# Patient Record
Sex: Female | Born: 1960
Health system: Southern US, Community
[De-identification: ages and names within clinical notes are randomized; demographics above are authoritative.]

## PROBLEM LIST (undated history)

## (undated) DIAGNOSIS — D649 Anemia, unspecified: Secondary | ICD-10-CM

## (undated) DIAGNOSIS — E785 Hyperlipidemia, unspecified: Secondary | ICD-10-CM

## (undated) DIAGNOSIS — K219 Gastro-esophageal reflux disease without esophagitis: Secondary | ICD-10-CM

## (undated) DIAGNOSIS — I10 Essential (primary) hypertension: Secondary | ICD-10-CM

## (undated) DIAGNOSIS — R011 Cardiac murmur, unspecified: Secondary | ICD-10-CM

## (undated) DIAGNOSIS — E669 Obesity, unspecified: Secondary | ICD-10-CM

## (undated) DIAGNOSIS — T7840XA Allergy, unspecified, initial encounter: Secondary | ICD-10-CM

## (undated) DIAGNOSIS — G44009 Cluster headache syndrome, unspecified, not intractable: Secondary | ICD-10-CM

## (undated) DIAGNOSIS — E119 Type 2 diabetes mellitus without complications: Secondary | ICD-10-CM

## (undated) DIAGNOSIS — Z8719 Personal history of other diseases of the digestive system: Secondary | ICD-10-CM

## (undated) DIAGNOSIS — R42 Dizziness and giddiness: Secondary | ICD-10-CM

## (undated) HISTORY — PX: COLONOSCOPY: SHX174

## (undated) HISTORY — PX: OTHER SURGICAL HISTORY: SHX169

## (undated) HISTORY — DX: Cluster headache syndrome, unspecified, not intractable: G44.009

## (undated) HISTORY — DX: Hyperlipidemia, unspecified: E78.5

## (undated) HISTORY — DX: Gastro-esophageal reflux disease without esophagitis: K21.9

## (undated) HISTORY — DX: Type 2 diabetes mellitus without complications: E11.9

## (undated) HISTORY — PX: UPPER GASTROINTESTINAL ENDOSCOPY: SHX188

## (undated) HISTORY — DX: Allergy, unspecified, initial encounter: T78.40XA

## (undated) HISTORY — DX: Cardiac murmur, unspecified: R01.1

## (undated) HISTORY — DX: Anemia, unspecified: D64.9

## (undated) HISTORY — DX: Obesity, unspecified: E66.9

---

## 2001-06-02 ENCOUNTER — Ambulatory Visit (HOSPITAL_BASED_OUTPATIENT_CLINIC_OR_DEPARTMENT_OTHER): Admission: RE | Admit: 2001-06-02 | Discharge: 2001-06-02 | Payer: Self-pay | Admitting: Plastic Surgery

## 2001-11-13 ENCOUNTER — Emergency Department (HOSPITAL_COMMUNITY): Admission: EM | Admit: 2001-11-13 | Discharge: 2001-11-13 | Payer: Self-pay | Admitting: Emergency Medicine

## 2002-05-22 ENCOUNTER — Encounter: Admission: RE | Admit: 2002-05-22 | Discharge: 2002-05-22 | Payer: Self-pay | Admitting: Orthopedic Surgery

## 2002-05-22 ENCOUNTER — Encounter: Payer: Self-pay | Admitting: Orthopedic Surgery

## 2002-06-08 ENCOUNTER — Encounter: Payer: Self-pay | Admitting: Orthopedic Surgery

## 2002-06-08 ENCOUNTER — Encounter: Admission: RE | Admit: 2002-06-08 | Discharge: 2002-06-08 | Payer: Self-pay | Admitting: Orthopedic Surgery

## 2004-06-27 ENCOUNTER — Ambulatory Visit: Payer: Self-pay | Admitting: Family Medicine

## 2004-09-20 ENCOUNTER — Ambulatory Visit: Payer: Self-pay | Admitting: Family Medicine

## 2005-10-31 ENCOUNTER — Ambulatory Visit: Payer: Self-pay | Admitting: Family Medicine

## 2006-09-17 ENCOUNTER — Ambulatory Visit: Payer: Self-pay | Admitting: Family Medicine

## 2006-10-14 ENCOUNTER — Ambulatory Visit: Payer: Self-pay | Admitting: Family Medicine

## 2006-12-19 ENCOUNTER — Ambulatory Visit: Payer: Self-pay | Admitting: Family Medicine

## 2006-12-19 DIAGNOSIS — J309 Allergic rhinitis, unspecified: Secondary | ICD-10-CM | POA: Insufficient documentation

## 2006-12-20 ENCOUNTER — Telehealth (INDEPENDENT_AMBULATORY_CARE_PROVIDER_SITE_OTHER): Payer: Self-pay | Admitting: *Deleted

## 2007-06-26 ENCOUNTER — Encounter: Payer: Self-pay | Admitting: Family Medicine

## 2007-10-31 ENCOUNTER — Encounter (INDEPENDENT_AMBULATORY_CARE_PROVIDER_SITE_OTHER): Payer: Self-pay | Admitting: Obstetrics & Gynecology

## 2007-10-31 ENCOUNTER — Ambulatory Visit (HOSPITAL_COMMUNITY): Admission: RE | Admit: 2007-10-31 | Discharge: 2007-11-01 | Payer: Self-pay | Admitting: Obstetrics & Gynecology

## 2007-10-31 ENCOUNTER — Encounter: Payer: Self-pay | Admitting: Obstetrics & Gynecology

## 2007-12-09 ENCOUNTER — Inpatient Hospital Stay (HOSPITAL_COMMUNITY): Admission: RE | Admit: 2007-12-09 | Discharge: 2007-12-11 | Payer: Self-pay | Admitting: Obstetrics & Gynecology

## 2007-12-09 ENCOUNTER — Encounter (INDEPENDENT_AMBULATORY_CARE_PROVIDER_SITE_OTHER): Payer: Self-pay | Admitting: Obstetrics & Gynecology

## 2008-03-11 ENCOUNTER — Ambulatory Visit: Payer: Self-pay | Admitting: Family Medicine

## 2008-03-11 LAB — CONVERTED CEMR LAB: Rapid Strep: POSITIVE

## 2008-04-10 ENCOUNTER — Ambulatory Visit: Payer: Self-pay | Admitting: Internal Medicine

## 2008-09-08 ENCOUNTER — Ambulatory Visit: Payer: Self-pay | Admitting: Family Medicine

## 2008-09-08 DIAGNOSIS — J069 Acute upper respiratory infection, unspecified: Secondary | ICD-10-CM | POA: Insufficient documentation

## 2010-12-05 ENCOUNTER — Other Ambulatory Visit: Payer: Self-pay | Admitting: Obstetrics & Gynecology

## 2010-12-05 DIAGNOSIS — R928 Other abnormal and inconclusive findings on diagnostic imaging of breast: Secondary | ICD-10-CM

## 2010-12-07 ENCOUNTER — Ambulatory Visit
Admission: RE | Admit: 2010-12-07 | Discharge: 2010-12-07 | Disposition: A | Discharge status: Home / Self Care | Payer: Managed Care, Other (non HMO) | Source: Ambulatory Visit | Attending: Obstetrics & Gynecology | Admitting: Obstetrics & Gynecology

## 2010-12-07 DIAGNOSIS — R928 Other abnormal and inconclusive findings on diagnostic imaging of breast: Secondary | ICD-10-CM

## 2010-12-26 NOTE — Op Note (Signed)
Linda [name], Linda [name]              ACCOUNT NO.:  [id number]   MEDICAL RECORD NO.:  [id number]          PATIENT TYPE:  AMB   LOCATION:  SDC                           FACILITY:  [facility]   PHYSICIAN:  Freddy Finner, M.D.   DATE OF BIRTH:  1961/04/14   DATE OF PROCEDURE:  12/09/2007  DATE OF DISCHARGE:                               OPERATIVE REPORT   PREOPERATIVE DIAGNOSIS:  Uterine leiomyomata.   POSTOPERATIVE DIAGNOSIS:  Uterine leiomyomata.   OPERATIVE PROCEDURE:  Myomectomy through open laparotomy incision.   ANESTHESIA:  General endotracheal anesthesia.   ESTIMATED INTRAOPERATIVE BLOOD LOSS:  Was 400 mL.   INTRAOPERATIVE COMPLICATIONS:  None.   FROZEN SECTION:  Intraoperatively, as the myomata which was atypical and  fluid-filled was benign.   INDICATIONS FOR PROCEDURE:  The history of present illness as recorded  in the admission note.  The patient was admitted on the morning of  surgery.   SURGEON:  Freddy Finner, M.D.   DESCRIPTION OF PROCEDURE:  She was given a bolus of Ancef.  She was  placed in serial compression hose.  She was brought to the operating  room.  There the abdomen, perineum and vagina were prepped in the usual  fashion.  The Foley catheter was placed using a sterile technique.  Sterile drapes were applied.  A midline lower abdominal incision was  made between the umbilicus and the symphysis.  The dissection was  carried sharply down to the rectus sheath which was entered sharply and  extended to the extent of the skin incision in a vertical fashion.  The  midline was identified.  The peritoneum was elevated and entered  sharply.  The peritoneum was extended to the extent of the skin  incision.  The uterus was delivered onto the anterior abdominal wall.  The uterus was globular overall.  Two other fibroids which were very  small and measured less than 2 cm were identified.  The uterus was soft.  After placing wet packs around the incision, to contain  fluid or blood  from the myoma, the dissection was begun.  Approximately an 8 cm was  made along the posterior wall of the uterus, and with careful sharp and  blunt dissection, this large myoma was freed up.  It did not show out in  a typical fashion consistent with myoma, and some of the myometrium was  included in the dissection.  When a towel clip was placed to hold the  mass, yellow mildly turbid fluid was immediately noticed and a large  amount of somewhat essentially clear fluid drained from the myoma.  With  digital palpation the surrounding tissue completely surrounding the cyst  was dissected free and submitted for frozen section, which showed no  significant atypical features to preliminary exam.  The other two myomas  were removed, one on the posterior fundal surface just to the right of  midline.  One near the right uterine artery on the lower segment was  also excised.  The defect was closed in layers using #0 Monocryl on the  larger incision.  The superficial layer  was with a running stitch of #0  Monocryl.  This produced adequate hemostasis.  Several sutures were  required at the right lower segment for complete hemostasis and closure.  This dissection did extend into the endometrial cavity.  After obtaining  complete hemostasis, exploration of the upper abdomen was carried out.  There were no palpable abnormalities of the liver, gallbladder, the  kidneys or other obvious palpable abnormalities to manual exploration of  the abdomen.  The appendix was visualized and was normal.  There was a  paratubal cyst right at the fimbria on the right that was ruptured.  The  tubes otherwise looked normal.  The ovaries were small but small  follicular-type cysts were noticed.  There was no other abnormality  noted.  Interceed was placed over the large posterior incision and the  uterus was delivered back into the abdominal cavity.  Irrigation was  carried out at various points during the  procedure.  With appropriate  pack, needle and instrument counts, the abdominal incision was closed in  layers.  Running #0 Monocryl was used to close the peritoneum.  The  fascia was closed with a double layer of #0 PDS running from inferior to  the mid-portion of the incision and from superior to the mid-portion of  the incision.  The subcutaneous tissue was approximated with a running  #2-0 plain.  The skin was closed with wide skin staples.   The patient tolerated the operative procedure well.  She was awakened  and taken to the recovery room in good condition.      Freddy Finner, M.D.  Electronically Signed     WRN/MEDQ  D:  12/09/2007  T:  12/09/2007  Job:  [id number]

## 2010-12-26 NOTE — Op Note (Signed)
Linda [name], Linda [name]              ACCOUNT NO.:  [id number]   MEDICAL RECORD NO.:  [id number]          PATIENT TYPE:  OUT   LOCATION:  MRI                          FACILITY:  [facility]   PHYSICIAN:  Freddy Finner, M.D.   DATE OF BIRTH:  11-Sep-1960   DATE OF PROCEDURE:  10/31/2007  DATE OF DISCHARGE:                               OPERATIVE REPORT   PREOPERATIVE DIAGNOSIS:  Large cystic intrauterine mass consistent with  hematocolpos and/or degenerating liquefied myoma.   POSTOPERATIVE DIAGNOSIS:  No evidence of intracavitary hematoma or  degenerating myoma, although the posterior endometrial wall was markedly  elevated consistent with a very large submucous and/or transmural myoma.   OPERATIVE PROCEDURE:  Hysteroscopy, dilatation and curettage.   INTRAOPERATIVE COMPLICATIONS:  None.   ANESTHESIA:  General.   ESTIMATED INTRAOPERATIVE BLOOD LOSS:  Less than or equal to 20 mL.   SORBITOL DEFICIT:  Thought to be less than 100.   The patient is a 50 year old white married female nulligravida whom I  have been seen since September 2008 when she presented for an annual  exam and infertility evaluation.  At that time, her uterus was thought  to be approximately [redacted] weeks gestational size and subsequent pelvic  ultrasound showed a 7.9 x 6.9 cystic mass seen in the myometrium that  appeared to be a degenerating myoma.  There were two additional  intramural leiomyomas noted.  Over the interval since that time, she has  been through the two or more Clements cycles and was seen on March 19  for follow up stating that she had been bleeding since February 24 with  some very heavy bleeding and clots.  She had actually been phoned in a  prescription for a 50 mcg birth control pill which she had taken twice a  day for five days with some improvement of bleeding but still having  spotting. Her examination on that day was remarkable for marked  enlargement of the uterus and an additional ultrasound was  obtained  which showed a collection of fluid thought to be within the endometrial  cavity measuring 13.3 x 8.1 x 9.9 cm. She also had a 2.4 cm simple  appearing cyst on the right ovary.  This was dramatically different than  her recent ultrasound and it was elected to admit her on the 20th for  hysteroscopy and D&C to evacuate clots and/or evaluate the endometrium.   She was admitted to the hospital and brought to the operating room after  receiving a bolus of Ancef, placed under IV sedation, placed in the  dorsal lithotomy position.  Betadine prep of the mons perineum and  vagina was carried out.  Sterile drapes were applied.  Bivalve speculum  was introduced and the cervix was very high and anterior in position.  There was some difficulty getting an adequate exposure and adequate  traction on the cervix because the uterus and cervix were not descended  into the pelvis.  The posterior lip was eventually grasped with a  tenaculum and the cervix opened with a small Pratt dilator and an os  finder.  The cervix  was then progressively dilated to 25 with the [facility]  dilators and the 12.5 degree hysteroscope introduced using sorbitol as a  distending medium.  The cavity was markedly distorted.  There was a  large, rounded mass protruding into the cavity from the posterior fundal  surface.  The anterior endometrium appeared to be normal.  Gentle  curettage was carried out and samples submitted for examination.  Some  concern was entertained that perforation could have occurred, although  this was not felt to be very likely, the distorted image was so  different that it was somewhat confusing.  A sound was placed into the  uterus which sounded to a least 14 cm and could be palpated through the  abdominal wall in the fundus without perforation.  The patient was then  awakened and taken to the recovery room and it was elected to perform a  CT scan in the immediate postop period which was done.   This, too,  showed a large fluid like mass but the CT would not differentiate  whether it was intracavitary or intramural. At the suggestion of the  radiologist, an MRI scan was obtained which was completed on the evening  after surgery which showed this to be intramural and approximately 16 cm  in greatest dimension and consistent with a liquefied myoma.  Because of  the late hour in the surgery and other procedures, the patient was kept  overnight.  She remained stable throughout the night with no abnormality  of vital signs.  Her admission hemoglobin was 12.1.  Her bleeding was  light to moderate at most.  She was considered to be stable and was  discharged on the morning after surgery for planned follow up in the  office.      Freddy Finner, M.D.  Electronically Signed     WRN/MEDQ  D:  11/01/2007  T:  11/01/2007  Job:  [id number]

## 2010-12-26 NOTE — H&P (Signed)
NAMESHAWNDELL, VARAS              ACCOUNT NO.:  [id number]   MEDICAL RECORD NO.:  [id number]          PATIENT TYPE:  AMB   LOCATION:  SDC                           FACILITY:  [facility]   PHYSICIAN:  Freddy Finner, M.D.   DATE OF BIRTH:  02/16/1961   DATE OF ADMISSION:  12/09/2007  DATE OF DISCHARGE:                              HISTORY & PHYSICAL   ADMISSION DIAGNOSIS:  Uterine leiomyomata with extremely large posterior  wall liquefied myoma.   HISTORY OF PRESENT ILLNESS:  The patient is a 50 year old black single  female who first presented to my office in September of 2008.  She was  newly married and anxious to be pregnant.  Complete history and physical  at that time revealed the uterus to be enlarged to approximately [redacted] weeks  gestational size.  Her physical findings were otherwise unremarkable  except for moderate obesity.  She was brought into the office for pelvic  ultrasound at which time a 7.9 x 6.9 cystic mass was seen in the  myometrium which was thought at that time to be a degenerating myoma.  There were additional smaller myomas in the wall of the uterus including  one that measured 18 mm and another at 10 mm.  Other pelvic findings  were considered to be essentially normal.  After evaluation, the patient  was tried on Clomid cycles for a period of three cycles.  She did appear  to be ovulatory based on following those cycles and on follow-up  examination in March of 2009, she stated that she had been bleeding  since February 24, which made a total of approximately 24 days of  bleeding and even in the presence of Ovcon-50 b.i.d. her bleeding had  continued.  Repeat pelvic ultrasound was obtained which now showed what  appeared to be a fluid collection in the endometrial cavity measuring  13.3 x 8.1 x 9.9 cm.  Based on this and the very heavy bleeding which  she has experienced, it was elected to perform hysteroscopy and D&C at  [facility] and that was accomplished on  March 20, with atypical  findings on hysteroscopy with what appeared to be a huge mass protruding  into the endometrial cavity from the posterior wall.  Some concern was  entertained at that time that perforation may have occurred because of  the atypical appearance and the absence of any significant blood clotted  material in the endometrial cavity and subsequent x-ray evaluation at  the hospital was obtained including an immediate postoperative CT of the  pelvis, just showing anterior wall small myomas and a focal separate  posterior superior mass measuring 10.1 x 12.8 x 16.1 cm with central  cystic components.  To make certain that this was not included in the  endometrium, an MRI was obtained of the pelvis which showed definite  endometrial component which isolated this mass which on MRI was measured  at 14.4 x 8.8 x 8.1 cm to the posterior wall.  The presence of this mass  has dramatically distorted the endometrial cavity and is considered to  be at least a  significant reason for her inability to conceive even in  the presence of ovulatory cycles, plus the mass has dramatically  enlarged over a relatively short interval of time.  Based on these  findings and her request to proceed, she is now admitted for an open  myomectomy.   REVIEW OF SYSTEMS:  Current review of systems is otherwise negative.  There are no cardiopulmonary, GI, or GU complaints.  She has no known  significant medical illnesses.  She is currently on no medications on a  chronic basis except for the birth control pills at times to control  bleeding.  She does occasionally take Allegra D.   ALLERGIES:  No known drug allergies.   She has not had a history of blood transfusion.  She does not use  alcohol or cigarettes.  She has had mammogram approximately one year ago  which was said to be normal and has a history of normal Pap smears  including a normal smear in my office in September of 2008.   FAMILY HISTORY:   Noncontributory.   PHYSICAL EXAMINATION:  HEENT:  Grossly within normal limits.  Thyroid  gland is not palpably enlarged.  VITAL SIGNS:  Blood pressure in the office is 120/90.  HEENT:  Grossly within normal limits.  The patient is articulate,  oriented, and cooperative.  The thyroid gland is not palpably enlarged  to my examination.  CHEST:  Clear to auscultation.  HEART:  Normal sinus rhythm without murmurs, rubs, or gallops.  BREASTS:  Normal.  No skin change, no nipple discharge, no palpable  masses.  ABDOMEN:  Obese, but otherwise remarkable only for a large pelvic mass  arising to a level approximately 2-3 cm below the umbilicus.  EXTREMITIES:  Without cyanosis, clubbing, or edema.  PELVIC:  Does reveal a large approximately 16-18 weeks size mass in the  midline consistent with the uterus.  There are no palpable adnexal  masses other than the nodular uterus, but this examination is  compromised by the size of the uterus.  RECTAL:  The rectum is palpably normal and rectovaginal examination  confirms the above findings.   On April 16, at her last physician office visit her hemoglobin was 12.1.   ASSESSMENT:  Uterine leiomyomata with one huge posterior wall myoma  which appears to be liquefied or necrotic cystic central areas.   PLAN:  Laparotomy, resection of uterine myomata.  The potential risks of  the procedure including hemorrhage, infection, injury to other organs  has been discussed with the patient.  The small possibility of  hysterectomy to control hemorrhage has also been discussed with the  patient and she is prepared to proceed with the surgery as planned.  She  will be treated perioperatively with serial compression hose and with IV  antibiotics.      Freddy Finner, M.D.  Electronically Signed     WRN/MEDQ  D:  12/08/2007  T:  12/08/2007  Job:  [id number]
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
## 2010-12-29 NOTE — Assessment & Plan Note (Signed)
[facility]                                 ON-CALL NOTE   DEVRI, KREHER                       MRN:          [id number]  DATE:10/15/2006                            DOB:          [birth year]    Phone number [phone].  Patient of Kerby Nora, MD  Phone call at 6:57 p.m. on March 4.   She calls because the prescription for Tamiflu was not received at the  pharmacy.  She is using [facility] [employer] [facility], [phone].  I happened to be at a  meeting with Dr. Ermalene Searing and reviewed her intentions and she did want to  give her the Tamiflu, so I went ahead and phoned it in to the [facility]  for her.     Karie Schwalbe, MD  Electronically Signed    RIL/MedQ  DD: 10/15/2006  DT: 10/16/2006  Job #: [id number]   cc:   Kerby Nora, MD

## 2010-12-29 NOTE — Discharge Summary (Signed)
NAMEDANILYN, COCKE              ACCOUNT NO.:  [id number]   MEDICAL RECORD NO.:  [id number]          PATIENT TYPE:  INP   LOCATION:  9318                          FACILITY:  [facility]   PHYSICIAN:  Freddy Finner, M.D.   DATE OF BIRTH:  [birth year]   DATE OF ADMISSION:  12/09/2007  DATE OF DISCHARGE:  12/11/2007                               DISCHARGE SUMMARY   DISCHARGE DIAGNOSES:  Uterine leiomyomata including one very large  approximately 16-cm cystic myoma, which was atypical in appearance, but  completely benign by histological examination and frozen examination.   OPERATIVE PROCEDURE:  Myomectomy.   INTRAOPERATIVE COMPLICATIONS:  None.   POSTOPERATIVE COMPLICATIONS:  None.   DISPOSITION:  The patient was discharged home on the second  postoperative day.  Skin staples were left in place because of a  vertical incision.  The patient's condition was considered to be good.  She was having adequate bowel and bladder function, tolerating a regular  diet, and managing pain with oral narcotic pain medication.  She is  discharged home for followup in 1 week for staple removal.  She is to  have progressively increasing physical activity, but no heavy lifting,  and no vaginal entry for at least the first week.  She is to report  fever or heavy vaginal bleeding or incisional bleeding.  She is to check  her regular diet.   Details of the present illness, past history, family history, review of  systems and physical exam are recorded in the admission note.  The  patient's history is quite remarkable for heavy bleeding with an  atypical appearance and previous hysteroscopy, D&C, and evaluation in  the hospital including MRI to delineate a cystic 16-cm mass in the  uterus.  Other physical findings on admission were normal.   LABORATORY DATA:  During this admission includes the histology report  noted above.  She had on admission a CBC with hemoglobin of 12.5 and  postoperative hemoglobin was  8.8 on the first postoperative day and 8.5  on the second.   Admission prothrombin time 52 and INR were all normal as well the  urinalysis.   IMPRESSION:  Admitted on the morning of surgery, she had IV antibiotic  and serial compression as to lower extremities.  Preoperatively, she was  taken to the operating room, the above-described procedure was  accomplished.  It was a difficult procedure, but without any  intraoperative complications.  Her postoperative course was then  tolerated satisfactorily.  She had progressively increasing activity by  the morning of the second postoperative day.  Her condition was good.  Disposition as noted above.  She is to follow up in the office as  reported in 5-7 days for staple removal.      W. Varney Baas, M.D.  Electronically Signed     WRN/MEDQ  D:  12/27/2007  T:  12/28/2007  Job:  [id number]

## 2010-12-29 NOTE — Assessment & Plan Note (Signed)
[facility]                                 ON-CALL NOTE   Linda [name], Linda [name]                       MRN:          [id number]  DATE:10/15/2006                            DOB:          1961-05-19    Phone number:  [phone].  Patient of Kerby Nora, MD  She called at 5:47 p.m. on March 4.   She is having a severe cough.  She had seen Dr. Ermalene Searing a day ago, was  getting Tamiflu phoned in but also wanted to have something for cough.  She has not tried anything over the counter yet.   PLAN:  I talked to her about using dextromethorphan over the counter and  trying not to lie down flat for sleep as that may stimulate her cough,  and she was going to give that a try.     Karie Schwalbe, MD  Electronically Signed    RIL/MedQ  DD: 10/15/2006  DT: 10/16/2006  Job #: [id number]   cc:   Kerby Nora, MD

## 2011-05-07 LAB — CBC
MCV: 83.6
Platelets: 342
WBC: 8.5

## 2011-05-08 LAB — URINALYSIS, ROUTINE W REFLEX MICROSCOPIC
Ketones, ur: NEGATIVE
Nitrite: NEGATIVE
Protein, ur: NEGATIVE
pH: 5.5

## 2011-05-08 LAB — CBC
HCT: 37
Hemoglobin: 8.5 — ABNORMAL LOW
Platelets: 216
Platelets: 286
RBC: 3 — ABNORMAL LOW
RBC: 4.45
RDW: 13.3
WBC: 8.7

## 2011-05-08 LAB — PROTIME-INR: Prothrombin Time: 12.4

## 2011-05-08 LAB — APTT: aPTT: 27

## 2011-06-13 ENCOUNTER — Encounter: Payer: Self-pay | Admitting: Family Medicine

## 2011-06-13 ENCOUNTER — Ambulatory Visit (INDEPENDENT_AMBULATORY_CARE_PROVIDER_SITE_OTHER): Payer: Managed Care, Other (non HMO) | Admitting: Family Medicine

## 2011-06-13 DIAGNOSIS — R079 Chest pain, unspecified: Secondary | ICD-10-CM | POA: Insufficient documentation

## 2011-06-13 NOTE — Assessment & Plan Note (Signed)
Does not sound like cardiac pain as best as can be elicited. Discussed at length sxs and signs. Would give attempt at diet exercise and weight loss with lifestyle changes as well to treat poss GERD. (see instructions) If sxs worsen, call or to [facility]. spent wih pt.

## 2011-06-13 NOTE — Progress Notes (Signed)
  Subjective:    Patient ID: Linda [name], female    DOB: Sep 16, 1960, 50 y.o.   MRN: [id number]  HPI Pt of Dr Royden Purl not seen since 1/10 when seen by Dr Patsy Lager who is here as acute appt for heartburn. She really has been healthy in the past. She sees a Gyn yearly.  She is not having sxs currently but has burning in her upper chest and sometimes down the left arm. She took IBP yesterday and improved. She is in graduate school but wants to unsure about her heart.   FH of CAD in Aunt. GM had cardiomegaly and Uncle with CAD.  She does not think sxs are related to eating. Her sxs started upon awakening she classifies as an underlying annoyance she says is burning. She says burning in upper left chest. She has been at work today.  Last night she had PBJ with apple juice.  Her sxs have been for 3-4 weeks. She is very stressed this semester in [employer] school. Her sxs started one month ago and lost her dog one month ago. She has no plan for stress. She really thinks she is having stress related.   Pt really here to be reassured she is not having heart problems.    Review of SystemsNoncontributory except as above.       Objective:   Physical Exam  Constitutional: She appears well-developed and well-nourished. No distress.  HENT:  Head: Normocephalic and atraumatic.  Right Ear: External ear normal.  Left Ear: External ear normal.  Nose: Nose normal.  Mouth/Throat: Oropharynx is clear and moist. No oropharyngeal exudate.  Eyes: Conjunctivae and EOM are normal. Pupils are equal, round, and reactive to light.  Neck: Normal range of motion. Neck supple. No thyromegaly present.  Cardiovascular: Normal rate, regular rhythm and normal heart sounds.   Pulmonary/Chest: Effort normal and breath sounds normal. She has no wheezes. She has no rales.  Abdominal: Soft. Bowel sounds are normal. She exhibits no distension. There is no tenderness.  Lymphadenopathy:    She has no cervical adenopathy.  Skin: She  is not diaphoretic.          Assessment & Plan:

## 2011-06-13 NOTE — Patient Instructions (Addendum)
GERD can be treated with medication but also with lifestyle changes including avoidance of late meals, excess alcohol, smoking cessation, avoiding spicy foods, chocolate, peppermint, colas, red wine and  acidic juices such as orange or tomato juice. Do not take mint or menthol products, use sugarless candy instead (Jolly Ranchers)  RTC or go to [facility] ER for prolonged chest pain.  Use Maalox for acute sxs and see if helps.  Make appt to see Dr Milinda Antis and check chol, etc. 25 mins spent with pt, half of which was counseling.

## 2011-10-09 ENCOUNTER — Encounter: Payer: Self-pay | Admitting: Family Medicine

## 2011-10-09 ENCOUNTER — Ambulatory Visit (INDEPENDENT_AMBULATORY_CARE_PROVIDER_SITE_OTHER): Payer: Managed Care, Other (non HMO) | Admitting: Family Medicine

## 2011-10-09 VITALS — BP 142/94 | HR 76 | Temp 98.1°F | Wt 214.0 lb

## 2011-10-09 DIAGNOSIS — Z136 Encounter for screening for cardiovascular disorders: Secondary | ICD-10-CM

## 2011-10-09 DIAGNOSIS — K2100 Gastro-esophageal reflux disease with esophagitis, without bleeding: Secondary | ICD-10-CM | POA: Insufficient documentation

## 2011-10-09 DIAGNOSIS — Z0000 Encounter for general adult medical examination without abnormal findings: Secondary | ICD-10-CM

## 2011-10-09 DIAGNOSIS — K219 Gastro-esophageal reflux disease without esophagitis: Secondary | ICD-10-CM | POA: Insufficient documentation

## 2011-10-09 LAB — LIPID PANEL
HDL: 65.4 mg/dL (ref >39.00)
LDL Cholesterol: 122 mg/dL — ABNORMAL HIGH (ref 0–99)
Total CHOL/HDL Ratio: 3
Triglycerides: 46 mg/dL (ref 0.0–149.0)
VLDL: 9.2 mg/dL (ref 0.0–40.0)

## 2011-10-09 LAB — BASIC METABOLIC PANEL
CO2: 22 mEq/L (ref 19–32)
Calcium: 8.3 mg/dL — ABNORMAL LOW (ref 8.4–10.5)
Creatinine, Ser: 0.7 mg/dL (ref 0.4–1.2)

## 2011-10-09 NOTE — Progress Notes (Signed)
  Subjective:    Patient ID: Linda [name], female    DOB: 11-Oct-1960, 51 y.o.   MRN: [id number]  HPI 51 yo pt of Dr Royden Purl with h/o GERD, saw Dr. Hetty Ely last fall and was advised to cut out coffee, fried foods and chocolate. Symptoms improved but still has some post prandial belching and burning in chest and throat. No n/v/d. No abdominal pain. No changes in bowel habits. No blood in stool.  Not taking any medication for it.  Also would like cholesterol checked today--she is fasting.  Review of Systems Noncontributory except as above.    Patient Active Problem List  Diagnoses  . URI  . Allergic Rhinitis, Cause Unspecified  . Chest pain  . GERD (gastroesophageal reflux disease)   Past Medical History  Diagnosis Date  . Allergic rhinitis    No past surgical history on file. History  Substance Use Topics  . Smoking status: Never Smoker   . Smokeless tobacco: Never Used  . Alcohol Use: Yes     occassionally   No family history on file. No Known Allergies Current Outpatient Prescriptions on File Prior to Visit  Medication Sig Dispense Refill  . Multiple Vitamin (MULTIVITAMIN) tablet Take 1 tablet by mouth daily.        . Omega-3 Fatty Acids (FISH OIL) 1000 MG CAPS Take by mouth daily.         The PMH, PSH, Social History, Family History, Medications, and allergies have been reviewed in [facility], and have been updated if relevant.      Objective:   Physical Exam  BP 142/94  Pulse 76  Temp(Src) 98.1 F (36.7 C) (Oral)  Wt 214 lb (97.07 kg)  Constitutional: She appears well-developed and well-nourished. No distress.  HENT:  Head: Normocephalic and atraumatic.  Right Ear: External ear normal.  Left Ear: External ear normal.  Nose: Nose normal.  Mouth/Throat: Oropharynx is clear and moist. No oropharyngeal exudate.  Eyes: Conjunctivae and EOM are normal. Pupils are equal, round, and reactive to light.  Neck: Normal range of motion. Neck supple. No thyromegaly  present.  Cardiovascular: Normal rate, regular rhythm and normal heart sounds.   Pulmonary/Chest: Effort normal and breath sounds normal. She has no wheezes. She has no rales.  Abdominal: Soft. Bowel sounds are normal. She exhibits no distension. There is no tenderness.  Lymphadenopathy:    She has no cervical adenopathy.  Skin: She is not diaphoretic.      Assessment & Plan:   1. GERD (gastroesophageal reflux disease)     Improved but still symptomatic. Will add Pepcid 20 mg daily x 2 weeks and pt to call with update of symptoms. See pt instructions for further recs. The patient indicates understanding of these issues and agrees with the plan.

## 2011-10-09 NOTE — Patient Instructions (Addendum)
Great job cutting back on fried food, coffee and chocolate. Try to keep your head elevated at night. Increase fiber and water, and try over the counter gas-x or beano for bloating.  pepcid 20 mg daily for next 2 weeks. Exclude gas producing foods (beans, onions, celery, carrots, raisins, bananas, apricots, prunes, brussel sprouts, wheat germ, pretzels)

## 2011-10-10 ENCOUNTER — Other Ambulatory Visit (INDEPENDENT_AMBULATORY_CARE_PROVIDER_SITE_OTHER): Payer: Managed Care, Other (non HMO)

## 2011-10-10 DIAGNOSIS — R7309 Other abnormal glucose: Secondary | ICD-10-CM

## 2011-10-10 LAB — HEMOGLOBIN A1C: Hgb A1c MFr Bld: 7.2 % — ABNORMAL HIGH (ref 4.6–6.5)

## 2011-10-15 ENCOUNTER — Encounter: Payer: Self-pay | Admitting: Family Medicine

## 2011-10-15 ENCOUNTER — Ambulatory Visit (INDEPENDENT_AMBULATORY_CARE_PROVIDER_SITE_OTHER): Payer: Managed Care, Other (non HMO) | Admitting: Family Medicine

## 2011-10-15 VITALS — BP 140/84 | HR 64 | Temp 98.0°F | Wt 209.0 lb

## 2011-10-15 DIAGNOSIS — E119 Type 2 diabetes mellitus without complications: Secondary | ICD-10-CM

## 2011-10-15 MED ORDER — ONETOUCH ULTRA SYSTEM W/DEVICE KIT: 1.0000 | PACK | Freq: Once | Status: DC

## 2011-10-15 MED ORDER — METFORMIN HCL 500 MG PO TABS: 500.0000 mg | ORAL_TABLET | Freq: Two times a day (BID) | ORAL | Status: DC

## 2011-10-15 NOTE — Progress Notes (Signed)
  Subjective:    Patient ID: Linda [name], female    DOB: 06-27-1961, 51 y.o.   MRN: [id number]  HPI 51 yo pt who I initially saw last week for follow up GERD and to check fasting labs.   A1c and fasting CBG were elevated and both consistent with new onset DM.  Lab Results  Component Value Date   HGBA1C 7.2* 10/10/2011   Lab Results  Component Value Date   LDLCALC 122* 10/09/2011   CREATININE 0.7 10/09/2011   FCBG 146.   Non increased urinary frequency or thrist. Maternal aunt had NIDDM. No n/v/d. No abdominal pain. No changes in bowel habits.     Review of Systems Noncontributory except as above.    Patient Active Problem List  Diagnoses  . URI  . Allergic Rhinitis, Cause Unspecified  . Chest pain  . GERD (gastroesophageal reflux disease)  . Diabetes mellitus, new onset   Past Medical History  Diagnosis Date  . Allergic rhinitis    No past surgical history on file. History  Substance Use Topics  . Smoking status: Never Smoker   . Smokeless tobacco: Never Used  . Alcohol Use: Yes     occassionally   No family history on file. No Known Allergies Current Outpatient Prescriptions on File Prior to Visit  Medication Sig Dispense Refill  . Multiple Vitamin (MULTIVITAMIN) tablet Take 1 tablet by mouth daily.        . Omega-3 Fatty Acids (FISH OIL) 1000 MG CAPS Take by mouth daily.         The PMH, PSH, Social History, Family History, Medications, and allergies have been reviewed in [facility], and have been updated if relevant.      Objective:   Physical Exam  BP 140/84  Pulse 64  Temp(Src) 98 F (36.7 C) (Oral)  Wt 209 lb (94.802 kg)  Constitutional: She appears well-developed and well-nourished. No distress.  HENT:  Head: Normocephalic and atraumatic.  Skin: She is not diaphoretic.      Assessment & Plan:   1. Diabetes mellitus, new onset  Amb ref to Medical Nutrition Therapy-MNT, Hemoglobin A1c, Lipid panel   New. >25 min spent with face to face  with patient, >50% counseling and/or coordinating care. Start Metformin 500 mg twice daily. Refer to Diabetes education, discussed CBG checks three times weekly. LDL elevated for diabetes.  She is already working on diet, will recheck in 3 months along with repeat a1c. Copy of eat right diet given. The patient indicates understanding of these issues and agrees with the plan.

## 2011-10-15 NOTE — Patient Instructions (Signed)
Good to see you. Please take Metformin as directed. Stop to see Shirlee Limerick on your way out. Please schedule a lab visit in 3 months.

## 2011-10-24 ENCOUNTER — Encounter: Payer: Managed Care, Other (non HMO) | Attending: Family Medicine

## 2011-10-24 VITALS — Ht 59.5 in | Wt 207.4 lb

## 2011-10-24 DIAGNOSIS — Z713 Dietary counseling and surveillance: Secondary | ICD-10-CM | POA: Insufficient documentation

## 2011-10-24 DIAGNOSIS — E119 Type 2 diabetes mellitus without complications: Secondary | ICD-10-CM | POA: Insufficient documentation

## 2011-10-24 NOTE — Progress Notes (Signed)
  Patient was seen on 10/24/2011 for the first of a series of three diabetes self-management courses at [facility]. The following learning objectives were met by the patient during this course:   Defines the role of glucose and insulin  Identifies type of diabetes and pathophysiology  Defines the diagnostic criteria for diabetes and prediabetes  States the risk factors for Type 2 Diabetes  States the symptoms of Type 2 Diabetes  Defines Type 2 Diabetes treatment goals  Defines Type 2 Diabetes treatment options  States the rationale for glucose monitoring  Identifies A1C, glucose targets, and testing times  Identifies proper sharps disposal  Defines the purpose of a diabetes food plan  Identifies carbohydrate food groups  Defines effects of carbohydrate foods on glucose levels  Identifies carbohydrate choices/grams/food labels  States benefits of physical activity and effect on glucose  Review of suggested activity guidelines  Handouts given during class include:  Type 2 Diabetes: Basics Book  My Food Plan Book  Food and Activity Log  Patient has established the following initial goals:  Increase my exercise  Work on my stress levels  Lose Linda [name]  Follow-Up Plan: Contact my insurance company and see if they will pay for Core Class 2 and 3.

## 2011-11-20 DIAGNOSIS — Z0000 Encounter for general adult medical examination without abnormal findings: Secondary | ICD-10-CM | POA: Insufficient documentation

## 2011-11-22 ENCOUNTER — Ambulatory Visit: Payer: Managed Care, Other (non HMO)

## 2011-11-29 ENCOUNTER — Ambulatory Visit: Payer: Managed Care, Other (non HMO)

## 2012-01-04 ENCOUNTER — Encounter: Payer: Self-pay | Admitting: Family Medicine

## 2012-01-04 ENCOUNTER — Telehealth: Payer: Self-pay | Admitting: Family Medicine

## 2012-01-04 ENCOUNTER — Ambulatory Visit (INDEPENDENT_AMBULATORY_CARE_PROVIDER_SITE_OTHER): Payer: Managed Care, Other (non HMO) | Admitting: Family Medicine

## 2012-01-04 VITALS — BP 106/72 | HR 89 | Temp 98.2°F | Wt 207.0 lb

## 2012-01-04 DIAGNOSIS — R011 Cardiac murmur, unspecified: Secondary | ICD-10-CM | POA: Insufficient documentation

## 2012-01-04 DIAGNOSIS — N390 Urinary tract infection, site not specified: Secondary | ICD-10-CM | POA: Insufficient documentation

## 2012-01-04 DIAGNOSIS — R300 Dysuria: Secondary | ICD-10-CM

## 2012-01-04 LAB — POCT URINALYSIS DIPSTICK
Bilirubin, UA: NEGATIVE
Ketones, UA: NEGATIVE
Leukocytes, UA: NEGATIVE
pH, UA: 6

## 2012-01-04 MED ORDER — SULFAMETHOXAZOLE-TRIMETHOPRIM 800-160 MG PO TABS: 1.0000 | ORAL_TABLET | Freq: Two times a day (BID) | ORAL | Status: AC

## 2012-01-04 NOTE — Patient Instructions (Signed)
Drink plenty of water and start the antibiotics today.  We'll contact you with your lab report.  Take care.   

## 2012-01-04 NOTE — Telephone Encounter (Signed)
Caller: Pam/Patient; PCP: Gwinda Passe); CB#: [phone]; C/o borderline diabetes and she had UTI, went to [facility] and was treated with antibiotics x 7 days, but she was told no bacteria in urine. She completed antibiotics and continues to have frequency.  Left Flank Pain reported onset 01/03/12.  Frequency and urgency persist.  Appointment w/ Dr. Para March at 1600 - only available appointment.

## 2012-01-04 NOTE — Assessment & Plan Note (Signed)
Presumed, check ucx and start septra.  F/u prn.  See notes on ucx when resulted.

## 2012-01-04 NOTE — Progress Notes (Signed)
Was treated for UTI with cipro at [facility] early May 2013.  She has been travelling in the interval Now with return of symptoms.  Dysuria: yes, frequency and discomfort w/o frank burning.   duration of symptoms: worse over the last few days abdominal pain: no Fevers: no back pain: yes, some lower back pain Vomiting: no  She's working on her sugar.  Improved control recently.   She's exercising.    Meds, vitals, and allergies reviewed.   ROS: See HPI.  Otherwise negative.    GEN: nad, alert and oriented NECK: supple CV: rrr, soft (1/6) SEM at RUSB (old finding per patient) PULM: ctab, no inc wob ABD: soft, +bs, suprapubic area not tender EXT: no edema SKIN: no acute rash BACK: no CVA pain

## 2012-01-06 LAB — URINE CULTURE: Colony Count: 10000

## 2012-01-09 ENCOUNTER — Ambulatory Visit: Payer: Managed Care, Other (non HMO) | Admitting: Family Medicine

## 2012-01-10 ENCOUNTER — Encounter: Payer: Self-pay | Admitting: *Deleted

## 2012-01-14 ENCOUNTER — Other Ambulatory Visit: Payer: Managed Care, Other (non HMO)

## 2012-03-13 ENCOUNTER — Other Ambulatory Visit: Payer: Self-pay | Admitting: *Deleted

## 2012-03-13 MED ORDER — GLUCOSE BLOOD VI STRP: ORAL_STRIP | Status: DC

## 2012-10-15 ENCOUNTER — Other Ambulatory Visit: Payer: Self-pay | Admitting: Family Medicine

## 2012-11-24 ENCOUNTER — Other Ambulatory Visit: Payer: Self-pay | Admitting: *Deleted

## 2012-11-24 MED ORDER — METFORMIN HCL 500 MG PO TABS: ORAL_TABLET | ORAL | Status: DC

## 2013-01-13 DIAGNOSIS — E66812 Obesity, class 2: Secondary | ICD-10-CM | POA: Insufficient documentation

## 2013-01-13 DIAGNOSIS — E669 Obesity, unspecified: Secondary | ICD-10-CM | POA: Insufficient documentation

## 2013-09-08 LAB — HEMOGLOBIN A1C: Hgb A1c MFr Bld: 7.2 % — AB (ref 4.0–6.0)

## 2013-09-10 ENCOUNTER — Encounter: Payer: Self-pay | Admitting: Gastroenterology

## 2013-09-15 ENCOUNTER — Ambulatory Visit (INDEPENDENT_AMBULATORY_CARE_PROVIDER_SITE_OTHER): Payer: Managed Care, Other (non HMO) | Admitting: Family Medicine

## 2013-09-15 ENCOUNTER — Encounter: Payer: Self-pay | Admitting: Family Medicine

## 2013-09-15 VITALS — BP 128/82 | HR 77 | Temp 98.1°F | Ht 59.5 in | Wt 201.0 lb

## 2013-09-15 DIAGNOSIS — E119 Type 2 diabetes mellitus without complications: Secondary | ICD-10-CM

## 2013-09-15 LAB — BASIC METABOLIC PANEL
BUN: 19 mg/dL (ref 6–23)
CALCIUM: 9.1 mg/dL (ref 8.4–10.5)
CO2: 25 mEq/L (ref 19–32)
Chloride: 107 mEq/L (ref 96–112)
Creatinine, Ser: 0.7 mg/dL (ref 0.4–1.2)
GFR: 115.58 mL/min (ref >60.00)
GLUCOSE: 152 mg/dL — AB (ref 70–99)
POTASSIUM: 4.1 meq/L (ref 3.5–5.1)
SODIUM: 138 meq/L (ref 135–145)

## 2013-09-15 LAB — MICROALBUMIN / CREATININE URINE RATIO
Creatinine,U: 147.1 mg/dL
MICROALB UR: 0.7 mg/dL (ref 0.0–1.9)
Microalb Creat Ratio: 0.5 mg/g (ref 0.0–30.0)

## 2013-09-15 LAB — HM DIABETES FOOT EXAM

## 2013-09-15 MED ORDER — METFORMIN HCL 500 MG PO TABS: ORAL_TABLET | ORAL | Status: DC

## 2013-09-15 NOTE — Addendum Note (Signed)
Addended by: Modena Nunnery on: 09/15/2013 11:11 AM   Modules accepted: Orders

## 2013-09-15 NOTE — Assessment & Plan Note (Signed)
Exercising again and motivated to lose weight.

## 2013-09-15 NOTE — Progress Notes (Signed)
Pre-visit discussion using our clinic review tool. No additional management support is needed unless otherwise documented below in the visit note.  

## 2013-09-15 NOTE — Assessment & Plan Note (Signed)
Deteriorated. Restart Metformin 500 mg twice daily. Check labs and urine microalbumin. Follow up in 3 months. The patient indicates understanding of these issues and agrees with the plan.  Orders Placed This Encounter  Procedures  . Basic Metabolic Panel  . Microalbumin/Creatinine Ratio, Urine  . Hemoglobin A1c  . HM Diabetes Foot Exam  . HM DIABETES FOOT EXAM

## 2013-09-15 NOTE — Patient Instructions (Signed)
Great to see you. Please restart Metformin 500 mg twice daily. Start rechecking your blood sugars regularly again. Keep working on diet and weight loss.  Come see me in 3 months.  I will call you with your lab and urine results from today.

## 2013-09-15 NOTE — Progress Notes (Signed)
Subjective:    Patient ID: Linda [name], female    DOB: 1960-10-26, 53 y.o.   MRN: [id number]  HPI 53 yo pt who has not been seen by me since she was diagnosed with diabetes almost two years ago here for "med refills."   A1c and fasting CBG were elevated and both consistent with new onset DM in 09/2011.  Started her on Metformin, discussed eat right diet, referred to diabetic teaching and advised follow up in 3 months.  She was lost to follow up because was being seen at [facility] weight loss center- lost 20 pounds and came off of Metformin.  Went to [facility] last month- brings in labs, a1c now 7.2 again.   Not checking CBGs regularly.  No increased urinary frequency or thrist. Maternal aunt had NIDDM. No n/v/d. No abdominal pain. No changes in bowel habits.  Lab Results  Component Value Date   HGBA1C 7.2* 10/10/2011   Lab Results  Component Value Date   LDLCALC 122* 10/09/2011   CREATININE 0.7 10/09/2011     Wt Readings from Last 3 Encounters:  09/15/13 201 lb (91.173 kg)  01/04/12 207 lb (93.895 kg)  10/24/11 207 lb 6.4 oz (94.076 kg)        Review of Systems Noncontributory except as above.    Patient Active Problem List   Diagnosis Date Noted  . Cardiac murmur 01/04/2012  . Diabetes mellitus 10/15/2011  . GERD (gastroesophageal reflux disease) 10/09/2011  . Allergic Rhinitis, Cause Unspecified 12/19/2006   Past Medical History  Diagnosis Date  . Allergic rhinitis   . Obesity   . Cardiac murmur     1/6 SEM at RUSB (old finding per patient)   No past surgical history on file. History  Substance Use Topics  . Smoking status: Never Smoker   . Smokeless tobacco: Never Used  . Alcohol Use: Yes     Comment: occassionally   No family history on file. No Known Allergies Current Outpatient Prescriptions on File Prior to Visit  Medication Sig Dispense Refill  . Blood Glucose Monitoring Suppl (ONE TOUCH ULTRA SYSTEM KIT) W/DEVICE KIT 1 kit by Does not apply  route once.  1 each  0  . glucose blood test strip One Touch Ultra  100 each  3  . metFORMIN (GLUCOPHAGE) 500 MG tablet TAKE 1 TABLET BY MOUTH TWICE A DAY WITH MEALS * Needs appointment with Dr for 90 day supply and additional refills*  60 tablet  0  . Multiple Vitamin (MULTIVITAMIN) tablet Take 1 tablet by mouth daily.        . Omega-3 Fatty Acids (FISH OIL) 1000 MG CAPS Take by mouth daily.         No current facility-administered medications on file prior to visit.   The PMH, PSH, Social History, Family History, Medications, and allergies have been reviewed in [facility], and have been updated if relevant.      Objective:   Physical Exam  BP 128/82  Pulse 77  Temp(Src) 98.1 F (36.7 C) (Oral)  Ht 4' 11.5" (1.511 m)  Wt 201 lb (91.173 kg)  BMI 39.93 kg/m2  SpO2 97%  Constitutional: She appears well-developed and well-nourished. No distress.   Head: Normocephalic and atraumatic.  Skin: She is not diaphoretic.  CVS:  1/6 SEM, RR Diabetic foot exam: Normal inspection No skin breakdown No calluses  Normal DP pulses Normal sensation to light touch and monofilament Nails normal     Assessment & Plan:

## 2013-09-16 ENCOUNTER — Encounter: Payer: Self-pay | Admitting: *Deleted

## 2013-10-15 ENCOUNTER — Ambulatory Visit (AMBULATORY_SURGERY_CENTER): Payer: Self-pay

## 2013-10-15 VITALS — Ht 59.5 in | Wt 205.8 lb

## 2013-10-15 DIAGNOSIS — Z1211 Encounter for screening for malignant neoplasm of colon: Secondary | ICD-10-CM

## 2013-10-15 MED ORDER — MOVIPREP 100 G PO SOLR: 1.0000 | Freq: Once | ORAL | Status: DC

## 2013-10-29 ENCOUNTER — Encounter: Payer: Self-pay | Admitting: Gastroenterology

## 2013-10-29 ENCOUNTER — Ambulatory Visit (AMBULATORY_SURGERY_CENTER): Payer: Managed Care, Other (non HMO) | Admitting: Gastroenterology

## 2013-10-29 VITALS — BP 128/74 | HR 61 | Temp 97.0°F | Resp 31 | Ht 59.0 in | Wt 205.0 lb

## 2013-10-29 DIAGNOSIS — Z1211 Encounter for screening for malignant neoplasm of colon: Secondary | ICD-10-CM

## 2013-10-29 DIAGNOSIS — D126 Benign neoplasm of colon, unspecified: Secondary | ICD-10-CM

## 2013-10-29 MED ORDER — SODIUM CHLORIDE 0.9 % IV SOLN: 500.0000 mL | INTRAVENOUS | Status: DC

## 2013-10-29 NOTE — Op Note (Signed)
[facility]  [address] [place], [id number]   COLONOSCOPY PROCEDURE REPORT  PATIENT: [name], [name]  MR#: [id number] BIRTHDATE: 1961/06/09 , 52  yrs. old GENDER: Female ENDOSCOPIST: Inda Castle, MD REFERRED VO:HYWVP Aron, M.D. PROCEDURE DATE:  10/29/2013 PROCEDURE:   Colonoscopy, diagnostic First Screening Colonoscopy - Avg.  risk and is 50 yrs.  old or older Yes.  Prior Negative Screening - Now for repeat screening. N/A  History of Adenoma - Now for follow-up colonoscopy & has been > or = to 3 yrs.  N/A ASA CLASS:   Class II INDICATIONS:average risk screening. MEDICATIONS: MAC sedation, administered by CRNA and propofol (Diprivan) 500mg  IV  DESCRIPTION OF PROCEDURE:   After the risks benefits and alternatives of the procedure were thoroughly explained, informed consent was obtained.  A digital rectal exam revealed no abnormalities of the rectum.   The LB [id number]  endoscope was introduced through the anus and advanced to the terminal ileum which was intubated for a short distance. No adverse events experienced.   The quality of the prep was excellent using Suprep The instrument was then slowly withdrawn as the colon was fully examined.      COLON FINDINGS: A sessile 2 mm polyp was seen on the far side of a fold in the ascending colon.  Unfortunately, I was unable to reidentify the polyp after the position of the scope changed.   The colon was otherwise normal.  There was no diverticulosis, inflammation, polyps or cancers unless previously stated.   The mucosa appeared normal in the terminal ileum.  Retroflexed views revealed no abnormalities. The time to cecum=2 minutes 53 seconds. Withdrawal time=20 minutes 59 seconds.  The scope was withdrawn and the procedure completed. COMPLICATIONS: There were no complications.  ENDOSCOPIC IMPRESSION: 1.   diminutive polyp, ascending colon 2.   The colon was otherwise normal 3.   Normal mucosa  in the terminal ileum  RECOMMENDATIONS: followup colonoscopy in 5 years in view of small ascending colon polyp   eSigned:  Inda Castle, MD 10/29/2013 10:17 AM   cc:   PATIENT NAME:  Linda [name], Linda [name] MR#: [id number]

## 2013-10-29 NOTE — Patient Instructions (Signed)
YOU HAD AN ENDOSCOPIC PROCEDURE TODAY AT THE [facility]: Refer to the procedure report that was given to you for any specific questions about what was found during the examination.  If the procedure report does not answer your questions, please call your gastroenterologist to clarify.  If you requested that your care partner not be given the details of your procedure findings, then the procedure report has been included in a sealed envelope for you to review at your convenience later.  YOU SHOULD EXPECT: Some feelings of bloating in the abdomen. Passage of more gas than usual.  Walking can help get rid of the air that was put into your GI tract during the procedure and reduce the bloating. If you had a lower endoscopy (such as a colonoscopy or flexible sigmoidoscopy) you may notice spotting of blood in your stool or on the toilet paper. If you underwent a bowel prep for your procedure, then you may not have a normal bowel movement for a few days.  DIET: Your first meal following the procedure should be a light meal and then it is ok to progress to your normal diet.  A half-sandwich or bowl of soup is an example of a good first meal.  Heavy or fried foods are harder to digest and may make you feel nauseous or bloated.  Likewise meals heavy in dairy and vegetables can cause extra gas to form and this can also increase the bloating.  Drink plenty of fluids but you should avoid alcoholic beverages for 24 hours.  ACTIVITY: Your care partner should take you home directly after the procedure.  You should plan to take it easy, moving slowly for the rest of the day.  You can resume normal activity the day after the procedure however you should NOT DRIVE or use heavy machinery for 24 hours (because of the sedation medicines used during the test).    SYMPTOMS TO REPORT IMMEDIATELY: A gastroenterologist can be reached at any hour.  During normal business hours, 8:30 AM to 5:00 PM Monday through Friday,  call [phone].  After hours and on weekends, please call the GI answering service at [phone] who will take a message and have the physician on call contact you.   Following lower endoscopy (colonoscopy or flexible sigmoidoscopy):  Excessive amounts of blood in the stool  Significant tenderness or worsening of abdominal pains  Swelling of the abdomen that is new, acute  Fever of 100F or higher    FOLLOW UP: If any biopsies were taken you will be contacted by phone or by letter within the next 1-3 weeks.  Call your gastroenterologist if you have not heard about the biopsies in 3 weeks.  Our staff will call the home number listed on your records the next business day following your procedure to check on you and address any questions or concerns that you may have at that time regarding the information given to you following your procedure. This is a courtesy call and so if there is no answer at the home number and we have not heard from you through the emergency physician on call, we will assume that you have returned to your regular daily activities without incident.  SIGNATURES/CONFIDENTIALITY: You and/or your care partner have signed paperwork which will be entered into your electronic medical record.  These signatures attest to the fact that that the information above on your After Visit Summary has been reviewed and is understood.  Full responsibility of the confidentiality   of this discharge information lies with you and/or your care-partner.     

## 2013-10-29 NOTE — Progress Notes (Signed)
Called to room to assist during endoscopic procedure.  Patient ID and intended procedure confirmed with present staff. Received instructions for my participation in the procedure from the performing physician.  

## 2013-10-29 NOTE — Progress Notes (Signed)
Stable to RR 

## 2013-10-30 ENCOUNTER — Telehealth: Payer: Self-pay

## 2013-10-30 NOTE — Telephone Encounter (Signed)
Left message on answering machine. 

## 2013-12-21 ENCOUNTER — Other Ambulatory Visit: Payer: Self-pay | Admitting: Family Medicine

## 2014-03-10 ENCOUNTER — Ambulatory Visit (INDEPENDENT_AMBULATORY_CARE_PROVIDER_SITE_OTHER): Payer: Managed Care, Other (non HMO) | Admitting: Internal Medicine

## 2014-03-10 ENCOUNTER — Encounter: Payer: Self-pay | Admitting: Internal Medicine

## 2014-03-10 VITALS — BP 118/76 | HR 62 | Temp 98.1°F | Wt 201.0 lb

## 2014-03-10 DIAGNOSIS — J3089 Other allergic rhinitis: Secondary | ICD-10-CM

## 2014-03-10 DIAGNOSIS — K219 Gastro-esophageal reflux disease without esophagitis: Secondary | ICD-10-CM

## 2014-03-10 NOTE — Progress Notes (Signed)
Subjective:    Patient ID: Linda [name], female    DOB: 1961/03/30, 53 y.o.   MRN: [id number]  HPI  Pt presents to the clinic today with c/o nausea when she eats. This started 1 week ago. She does have some associated gas and bloating. She has vomited x 1. She thinks it may be reflux but she is not sure. She has noted a burning sensation in her chest and a sore throat. She has tried Tums with some relief. She is having normal BM every other day. She has not seen blood in her stool or vomit.  She does have a history of DM2 treated with Metformin.  Additionally, she noted some runny nose, post nasal drip and headache. This started 2 days ago. She is not sure if it is related to her other symptoms. She denies fever, chills or body aches. She has not tried anything OTC for this. She does have a history of seasonal allergies.  Review of Systems      Past Medical History  Diagnosis Date  . Allergic rhinitis   . Obesity   . Cardiac murmur     1/6 SEM at [facility] (old finding per patient)  . Diabetes mellitus without complication     Current Outpatient Prescriptions  Medication Sig Dispense Refill  . Blood Glucose Monitoring Suppl (ONE TOUCH ULTRA SYSTEM KIT) W/DEVICE KIT 1 kit by Does not apply route once.  1 each  0  . cyclobenzaprine (FLEXERIL) 5 MG tablet Take 5 mg by mouth 3 (three) times daily as needed for muscle spasms.      . ferrous sulfate 325 (65 FE) MG tablet Take 325 mg by mouth daily with breakfast.      . glucose blood test strip One Touch Ultra  100 each  3  . ibuprofen (ADVIL,MOTRIN) 800 MG tablet Take 800 mg by mouth every 8 (eight) hours as needed.      . metFORMIN (GLUCOPHAGE) 500 MG tablet TAKE 1 TABLET BY MOUTH TWICE A DAY WITH MEALS  180 tablet  0  . Misc Natural Products (BLACK COHOSH MENOPAUSE COMPLEX PO) Take by mouth.      . Multiple Vitamin (MULTIVITAMIN) tablet Take 1 tablet by mouth daily.        . Omega-3 Fatty Acids (FISH OIL) 1000 MG CAPS Take by mouth  daily.         No current facility-administered medications for this visit.    No Known Allergies  Family History  Problem Relation Age of Onset  . Colon cancer Neg Hx   . Hypertension Mother     History   Social History  . Marital Status: Married    Spouse Name: N/A    Number of Children: 0  . Years of Education: N/A   Occupational History  . [employer] / [employer]    Social History Main Topics  . Smoking status: Never Smoker   . Smokeless tobacco: Never Used  . Alcohol Use: Yes     Comment: occassionally monthly  . Drug Use: No  . Sexual Activity: Not on file   Other Topics Concern  . Not on file   Social History Narrative   Drinks coffee   [place], [place] 2013     Constitutional: Pt reports headache. Denies fever, malaise, fatigue, or abrupt weight changes.  HEENT: Pt reports runny nose and sore throat. Denies eye pain, eye redness, ear pain, ringing in the ears, wax buildup nasal congestion, bloody nose. Respiratory:  Denies difficulty breathing, shortness of breath, cough or sputum production.   Cardiovascular: Denies chest pain, chest tightness, palpitations or swelling in the hands or feet.  Gastrointestinal: Pt reports nausea and bloating. Denies abdominal pain, constipation, diarrhea or blood in the stool.  GU: Denies urgency, frequency, pain with urination, burning sensation, blood in urine, odor or discharge.   No other specific complaints in a complete review of systems (except as listed in HPI above).  Objective:   Physical Exam   BP 118/76  Pulse 62  Temp(Src) 98.1 F (36.7 C) (Oral)  Wt 201 lb (91.173 kg)  SpO2 99% Wt Readings from Last 3 Encounters:  03/10/14 201 lb (91.173 kg)  10/29/13 205 lb (92.987 kg)  10/15/13 205 lb 12.8 oz (93.35 kg)    General: Appears her stated age, obese but well developed, well nourished in NAD. Marland Kitchen HEENT: Head: normal shape and size; Eyes: sclera white, no icterus, conjunctiva pink, PERRLA and EOMs intact;  Ears: Tm's gray and intact, normal light reflex; Nose: mucosa boggy and moist, septum midline; Throat/Mouth: Teeth present, mucosa pink and moist, + PND, no exudate, lesions or ulcerations noted.  Cardiovascular: Normal rate and rhythm. S1,S2 noted.  Murmur noted. No rubs or gallops noted. No JVD or BLE edema. No carotid bruits noted. Pulmonary/Chest: Normal effort and positive vesicular breath sounds. No respiratory distress. No wheezes, rales or ronchi noted.  Abdomen: Soft and mild epigastric tnederness. Hypoactive bowel sounds, no bruits noted. No distention or masses noted. Liver, spleen and kidneys non palpable.  BMET    Component Value Date/Time   NA 138 09/15/2013 1025   K 4.1 09/15/2013 1025   CL 107 09/15/2013 1025   CO2 25 09/15/2013 1025   GLUCOSE 152* 09/15/2013 1025   BUN 19 09/15/2013 1025   CREATININE 0.7 09/15/2013 1025   CALCIUM 9.1 09/15/2013 1025    Lipid Panel     Component Value Date/Time   CHOL 197 10/09/2011 0847   TRIG 46.0 10/09/2011 0847   HDL 65.40 10/09/2011 0847   CHOLHDL 3 10/09/2011 0847   VLDL 9.2 10/09/2011 0847   LDLCALC 122* 10/09/2011 0847    CBC    Component Value Date/Time   WBC 8.4 12/11/2007 0534   RBC 3.00* 12/11/2007 0534   HGB 8.5* 12/11/2007 0534   HCT 24.9* 12/11/2007 0534   PLT 222 12/11/2007 0534   MCV 82.9 12/11/2007 0534   MCHC 34.3 12/11/2007 0534   RDW 12.7 12/11/2007 0534    Hgb A1C Lab Results  Component Value Date   HGBA1C 7.2* 09/08/2013        Assessment & Plan:   GERD:  Will try prilosec OTC x 2 weeks  Ok to take Tums in addition if needed Information given on foods/medications to avoid  Allergic Rhinitis:  Try zyrtec and flonase OTC Watch for fever, worsening facial pain or productive cough  RTC as needed or if symptoms persist or worsen

## 2014-03-10 NOTE — Patient Instructions (Addendum)

## 2014-03-10 NOTE — Progress Notes (Signed)
Pre visit review using our clinic review tool, if applicable. No additional management support is needed unless otherwise documented below in the visit note. 

## 2014-03-31 ENCOUNTER — Encounter: Payer: Self-pay | Admitting: Family Medicine

## 2014-03-31 ENCOUNTER — Ambulatory Visit (INDEPENDENT_AMBULATORY_CARE_PROVIDER_SITE_OTHER): Payer: Managed Care, Other (non HMO) | Admitting: Family Medicine

## 2014-03-31 VITALS — BP 114/70 | HR 72 | Temp 98.5°F | Wt 202.5 lb

## 2014-03-31 DIAGNOSIS — R1319 Other dysphagia: Secondary | ICD-10-CM

## 2014-03-31 DIAGNOSIS — K219 Gastro-esophageal reflux disease without esophagitis: Secondary | ICD-10-CM

## 2014-03-31 DIAGNOSIS — K3189 Other diseases of stomach and duodenum: Secondary | ICD-10-CM

## 2014-03-31 DIAGNOSIS — R1013 Epigastric pain: Secondary | ICD-10-CM | POA: Insufficient documentation

## 2014-03-31 NOTE — Assessment & Plan Note (Addendum)
New with dyspepsia. This warrants endoscopy to rule out stricture/barrets. Less likely malignancy- does not drink ETOH or smoke tobacco. Restart prilosec and refer back to GI. Most consistent with GERD, cannot rule out biliary colic (less likely). The patient indicates understanding of these issues and agrees with the plan.

## 2014-03-31 NOTE — Assessment & Plan Note (Signed)
Persistent. Restart prilosec. See above.
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
## 2014-03-31 NOTE — Progress Notes (Signed)
Pre visit review using our clinic review tool, if applicable. No additional management support is needed unless otherwise documented below in the visit note. 

## 2014-03-31 NOTE — Patient Instructions (Signed)
Great to see you. Please restart Prilosec daily.  Stop by to see Rosaria Ferries on your way out to set up appointment with Dr. Deatra Ina.

## 2014-04-21 ENCOUNTER — Encounter: Payer: Self-pay | Admitting: Gastroenterology

## 2014-04-21 ENCOUNTER — Ambulatory Visit (INDEPENDENT_AMBULATORY_CARE_PROVIDER_SITE_OTHER): Payer: Managed Care, Other (non HMO) | Admitting: Gastroenterology

## 2014-04-21 VITALS — BP 140/98 | HR 68 | Ht 59.0 in | Wt 200.2 lb

## 2014-04-21 DIAGNOSIS — K219 Gastro-esophageal reflux disease without esophagitis: Secondary | ICD-10-CM

## 2014-04-21 DIAGNOSIS — R1013 Epigastric pain: Secondary | ICD-10-CM

## 2014-04-21 MED ORDER — OMEPRAZOLE 20 MG PO CPDR: 20.0000 mg | DELAYED_RELEASE_CAPSULE | Freq: Every day | ORAL | Status: DC

## 2014-04-21 NOTE — Progress Notes (Signed)
Reviewed and agree with management.  May also consider GES if not improved Azka Steger D. Deatra Ina, M.D., [facility]

## 2014-04-21 NOTE — Patient Instructions (Addendum)
Information on reflux is below for your review   We have sent the following medications to your pharmacy for you to pick up at your convenience: Omeprazole 20 mg, please take one capsule by mouth once daily   Please call in four weeks and make a follow up appointment with Alonza Bogus, PA-C or Dr. Deatra Ina. ________________________________________________________________________________________________________________  Food Choices for Gastroesophageal Reflux Disease When you have gastroesophageal reflux disease (GERD), the foods you eat and your eating habits are very important. Choosing the right foods can help ease the discomfort of GERD. WHAT GENERAL GUIDELINES DO I NEED TO FOLLOW?  Choose fruits, vegetables, whole grains, low-fat dairy products, and low-fat meat, fish, and poultry.  Limit fats such as oils, salad dressings, butter, nuts, and avocado.  Keep a food diary to identify foods that cause symptoms.  Avoid foods that cause reflux. These may be different for different people.  Eat frequent small meals instead of three large meals each day.  Eat your meals slowly, in a relaxed setting.  Limit fried foods.  Cook foods using methods other than frying.  Avoid drinking alcohol.  Avoid drinking large amounts of liquids with your meals.  Avoid bending over or lying down until 2-3 hours after eating. WHAT FOODS ARE NOT RECOMMENDED? The following are some foods and drinks that may worsen your symptoms: Vegetables Tomatoes. Tomato juice. Tomato and spaghetti sauce. Chili peppers. Onion and garlic. Horseradish. Fruits Oranges, grapefruit, and lemon (fruit and juice). Meats High-fat meats, fish, and poultry. This includes hot dogs, ribs, ham, sausage, salami, and bacon. Dairy Whole milk and chocolate milk. Sour cream. Cream. Butter. Ice cream. Cream cheese.  Beverages Coffee and tea, with or without caffeine. Carbonated beverages or energy drinks. Condiments Hot sauce.  Barbecue sauce.  Sweets/Desserts Chocolate and cocoa. Donuts. Peppermint and spearmint. Fats and Oils High-fat foods, including [place] fries and potato chips. Other Vinegar. Strong spices, such as black pepper, white pepper, red pepper, cayenne, curry powder, cloves, ginger, and chili powder. The items listed above may not be a complete list of foods and beverages to avoid. Contact your dietitian for more information. Document Released: 07/30/2005 Document Revised: 08/04/2013 Document Reviewed: 06/03/2013 [facility] Patient Information 2015 [place], [place]. This information is not intended to replace advice given to you by your health care provider. Make sure you discuss any questions you have with your health care provider.

## 2014-04-21 NOTE — Progress Notes (Signed)
     04/21/2014 ENVI EAGLESON [id number] 05/19/1961   History of Present Illness:  This is a pleasant 53 year old female who is known to Dr. Deatra Ina for colonoscopy in 10/2013 at which time she was found to have a diminutive polyp in the ascending colon; no pathology available but repeat colonoscopy recommended in 5 years.  She is here today to discuss some upper GI issues.  Complains of acid reflux and "irritation" in her esophagus.  She had reflux in the past, but took Tums with relief.  Symptoms have worsened over the past several months.  She admits that her diet is poor and she eats a lot of stuff that is bad for acid reflux.  She takes omeprazole 20 mg OTC on occasion, which does help her symptoms when she actually takes it, but "she tries not to take it daily".  Has some occasional nausea and some discomfort in her epigastrium.  Clears her throat often.  She denies any dysphagia.  If she eats lighter meals then she feels better.  She is diabetic and on treatment for the past 2 years or so with Metformin.  Current Medications, Allergies, Past Medical History, Past Surgical History, Family History and Social History were reviewed in [employer] record.   Physical Exam: BP 140/98  Pulse 68  Ht 4\' 11"  (1.499 m)  Wt 200 lb 3.2 oz (90.81 kg)  BMI 40.41 kg/m2 General: Well developed black female in no acute distress Head: Normocephalic and atraumatic Eyes:  Sclerae anicteric, conjunctiva pink  Ears: Normal auditory acuity Lungs: Clear throughout to auscultation Heart: Regular rate and rhythm Abdomen: Soft, non-distended.  Normal bowel sounds.  Mild epigastric TTP without R/R/G. Musculoskeletal: Symmetrical with no gross deformities  Extremities: No edema  Neurological: Alert oriented x 4, grossly non-focal Psychological:  Alert and cooperative. Normal mood and affect  Assessment and Recommendations: -GERD/epigastric abdominal pain:  Will have her begin taking  omeprazole 20 mg daily as well as follow GERD dietary measures.  Will have her follow-up in 4-6 weeks.  If no improvement in symptoms then would consider EGD.

## 2014-04-22 ENCOUNTER — Other Ambulatory Visit: Payer: Self-pay | Admitting: Family Medicine

## 2014-04-26 ENCOUNTER — Telehealth: Payer: Self-pay | Admitting: Gastroenterology

## 2014-04-26 NOTE — Telephone Encounter (Signed)
Spoke with patient and she states the Omeprazole that was ordered is the same dose as her OTC. Explained to patient that she is to take this daily for at least [redacted] weeks along with GERD diet to see if she feels better. She understands. Scheduled for f/u OV with Dr. Deatra Ina on 06/02/14 at 2:30 PM.

## 2014-04-26 NOTE — Telephone Encounter (Signed)
Left a message for patient to call back. 

## 2014-05-18 ENCOUNTER — Other Ambulatory Visit: Payer: Self-pay | Admitting: *Deleted

## 2014-06-02 ENCOUNTER — Encounter: Payer: Self-pay | Admitting: Gastroenterology

## 2014-06-02 ENCOUNTER — Ambulatory Visit (INDEPENDENT_AMBULATORY_CARE_PROVIDER_SITE_OTHER): Payer: Managed Care, Other (non HMO) | Admitting: Gastroenterology

## 2014-06-02 VITALS — BP 130/84 | HR 64 | Ht 59.5 in | Wt 196.4 lb

## 2014-06-02 DIAGNOSIS — K219 Gastro-esophageal reflux disease without esophagitis: Secondary | ICD-10-CM

## 2014-06-02 NOTE — Assessment & Plan Note (Signed)
Linda [name] is improved with omeprazole but patient continues to complain of immediate postprandial discomfort with swallowing.  I am not certain whether this is dysphagia, per se.  Recon  Recommendations #1 upper endoscopy #2 antireflux measures #3 continue omeprazole

## 2014-06-02 NOTE — Patient Instructions (Signed)
Gastroesophageal Reflux Disease, Adult Gastroesophageal reflux disease (GERD) happens when acid from your stomach flows up into the esophagus. When acid comes in contact with the esophagus, the acid causes soreness (inflammation) in the esophagus. Over time, GERD may create small holes (ulcers) in the lining of the esophagus. CAUSES   Increased body weight. This puts pressure on the stomach, making acid rise from the stomach into the esophagus.  Smoking. This increases acid production in the stomach.  Drinking alcohol. This causes decreased pressure in the lower esophageal sphincter (valve or ring of muscle between the esophagus and stomach), allowing acid from the stomach into the esophagus.  Late evening meals and a full stomach. This increases pressure and acid production in the stomach.  A malformed lower esophageal sphincter. Sometimes, no cause is found. SYMPTOMS   Burning pain in the lower part of the mid-chest behind the breastbone and in the mid-stomach area. This may occur twice a week or more often.  Trouble swallowing.  Sore throat.  Dry cough.  Asthma-like symptoms including chest tightness, shortness of breath, or wheezing. DIAGNOSIS  Your caregiver may be able to diagnose GERD based on your symptoms. In some cases, X-rays and other tests may be done to check for complications or to check the condition of your stomach and esophagus. TREATMENT  Your caregiver may recommend over-the-counter or prescription medicines to help decrease acid production. Ask your caregiver before starting or adding any new medicines.  HOME CARE INSTRUCTIONS   Change the factors that you can control. Ask your caregiver for guidance concerning weight loss, quitting smoking, and alcohol consumption.  Avoid foods and drinks that make your symptoms worse, such as:  Caffeine or alcoholic drinks.  Chocolate.  Peppermint or mint flavorings.  Garlic and onions.  Spicy foods.  Citrus fruits,  such as oranges, lemons, or limes.  Tomato-based foods such as sauce, chili, salsa, and pizza.  Fried and fatty foods.  Avoid lying down for the 3 hours prior to your bedtime or prior to taking a nap.  Eat small, frequent meals instead of large meals.  Wear loose-fitting clothing. Do not wear anything tight around your waist that causes pressure on your stomach.  Raise the head of your bed 6 to 8 inches with wood blocks to help you sleep. Extra pillows will not help.  Only take over-the-counter or prescription medicines for pain, discomfort, or fever as directed by your caregiver.  Do not take aspirin, ibuprofen, or other nonsteroidal anti-inflammatory drugs (NSAIDs). SEEK IMMEDIATE MEDICAL CARE IF:   You have pain in your arms, neck, jaw, teeth, or back.  Your pain increases or changes in intensity or duration.  You develop nausea, vomiting, or sweating (diaphoresis).  You develop shortness of breath, or you faint.  Your vomit is green, yellow, black, or looks like coffee grounds or blood.  Your stool is red, bloody, or black. These symptoms could be signs of other problems, such as heart disease, gastric bleeding, or esophageal bleeding. MAKE SURE YOU:   Understand these instructions.  Will watch your condition.  Will get help right away if you are not doing well or get worse. Document Released: 05/09/2005 Document Revised: 10/22/2011 Document Reviewed: 02/16/2011 [facility] Patient Information 2015 [place], [place]. This information is not intended to replace advice given to you by your health care provider. Make sure you discuss any questions you have with your health care provider.   Food Choices for Gastroesophageal Reflux Disease When you have gastroesophageal reflux disease (GERD), the  foods you eat and your eating habits are very important. Choosing the right foods can help ease the discomfort of GERD. WHAT GENERAL GUIDELINES DO I NEED TO FOLLOW?  Choose fruits,  vegetables, whole grains, low-fat dairy products, and low-fat meat, fish, and poultry.  Limit fats such as oils, salad dressings, butter, nuts, and avocado.  Keep a food diary to identify foods that cause symptoms.  Avoid foods that cause reflux. These may be different for different people.  Eat frequent small meals instead of three large meals each day.  Eat your meals slowly, in a relaxed setting.  Limit fried foods.  Cook foods using methods other than frying.  Avoid drinking alcohol.  Avoid drinking large amounts of liquids with your meals.  Avoid bending over or lying down until 2-3 hours after eating. WHAT FOODS ARE NOT RECOMMENDED? The following are some foods and drinks that may worsen your symptoms: Vegetables Tomatoes. Tomato juice. Tomato and spaghetti sauce. Chili peppers. Onion and garlic. Horseradish. Fruits Oranges, grapefruit, and lemon (fruit and juice). Meats High-fat meats, fish, and poultry. This includes hot dogs, ribs, ham, sausage, salami, and bacon. Dairy Whole milk and chocolate milk. Sour cream. Cream. Butter. Ice cream. Cream cheese.  Beverages Coffee and tea, with or without caffeine. Carbonated beverages or energy drinks. Condiments Hot sauce. Barbecue sauce.  Sweets/Desserts Chocolate and cocoa. Donuts. Peppermint and spearmint. Fats and Oils High-fat foods, including [place] fries and potato chips. Other Vinegar. Strong spices, such as black pepper, white pepper, red pepper, cayenne, curry powder, cloves, ginger, and chili powder. The items listed above may not be a complete list of foods and beverages to avoid. Contact your dietitian for more information. Document Released: 07/30/2005 Document Revised: 08/04/2013 Document Reviewed: 06/03/2013 [facility] Patient Information 2015 [place], [place]. This information is not intended to replace advice given to you by your health care provider. Make sure you discuss any questions you have with your  health care provider.    You have been scheduled for an endoscopy. Please follow written instructions given to you at your visit today. If you use inhalers (even only as needed), please bring them with you on the day of your procedure. Your physician has requested that you go to www.startemmi.com and enter the access code given to you at your visit today. This web site gives a general overview about your procedure. However, you should still follow specific instructions given to you by our office regarding your preparation for the procedure.

## 2014-06-02 NOTE — Progress Notes (Signed)
      History of Present Illness:  Linda [name] to complain of mild discomfort in her lower chest and upper abdomen immediately postprandially.  She denies dysphagia, per se.  Pyrosis is considerably better since taking omeprazole    Review of Systems: Pertinent positive and negative review of systems were noted in the above HPI section. All other review of systems were otherwise negative.    Current Medications, Allergies, Past Medical History, Past Surgical History, Family History and Social History were reviewed in [place] record  Vital signs were reviewed in today's medical record. Physical Exam: General: Well developed , well nourished, no acute distress  See Assessment and Plan under Problem List

## 2014-06-08 ENCOUNTER — Encounter: Payer: Self-pay | Admitting: Gastroenterology

## 2014-07-05 ENCOUNTER — Ambulatory Visit (INDEPENDENT_AMBULATORY_CARE_PROVIDER_SITE_OTHER): Payer: Managed Care, Other (non HMO) | Admitting: Family Medicine

## 2014-07-05 ENCOUNTER — Encounter: Payer: Self-pay | Admitting: Family Medicine

## 2014-07-05 VITALS — BP 128/72 | HR 67 | Temp 98.1°F | Wt 199.5 lb

## 2014-07-05 DIAGNOSIS — K219 Gastro-esophageal reflux disease without esophagitis: Secondary | ICD-10-CM

## 2014-07-05 DIAGNOSIS — E119 Type 2 diabetes mellitus without complications: Secondary | ICD-10-CM

## 2014-07-05 MED ORDER — METFORMIN HCL 500 MG PO TABS: 500.0000 mg | ORAL_TABLET | Freq: Two times a day (BID) | ORAL | Status: DC

## 2014-07-05 NOTE — Progress Notes (Signed)
Pre visit review using our clinic review tool, if applicable. No additional management support is needed unless otherwise documented below in the visit note. 
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
## 2014-07-05 NOTE — Assessment & Plan Note (Signed)
Overdue for labs and eye exam. She will schedule eye exam. Last LDL not at goal- will recheck this as well today. Orders Placed This Encounter  Procedures  . Hemoglobin A1c  . Comprehensive metabolic panel  . LDL Cholesterol, Direct

## 2014-07-05 NOTE — Assessment & Plan Note (Signed)
Persistent.  Dependent on diet. On PPI. Endoscopy scheduled.

## 2014-07-05 NOTE — Patient Instructions (Signed)
Great to see you. Happy Holidays!  We will call you with your lab results.  Please schedule your eye exam.

## 2014-07-06 LAB — COMPREHENSIVE METABOLIC PANEL
ALK PHOS: 87 U/L (ref 39–117)
ALT: 15 U/L (ref 0–35)
AST: 15 U/L (ref 0–37)
Albumin: 3.5 g/dL (ref 3.5–5.2)
BILIRUBIN TOTAL: 0.5 mg/dL (ref 0.2–1.2)
BUN: 20 mg/dL (ref 6–23)
CO2: 26 mEq/L (ref 19–32)
Calcium: 8.7 mg/dL (ref 8.4–10.5)
Chloride: 103 mEq/L (ref 96–112)
Creatinine, Ser: 0.7 mg/dL (ref 0.4–1.2)
GFR: 117.16 mL/min (ref >60.00)
Glucose, Bld: 150 mg/dL — ABNORMAL HIGH (ref 70–99)
Potassium: 3.7 mEq/L (ref 3.5–5.1)
Sodium: 136 mEq/L (ref 135–145)
Total Protein: 7 g/dL (ref 6.0–8.3)

## 2014-07-06 LAB — HEMOGLOBIN A1C: Hgb A1c MFr Bld: 7.1 % — ABNORMAL HIGH (ref 4.6–6.5)

## 2014-07-06 LAB — LDL CHOLESTEROL, DIRECT: LDL DIRECT: 118.9 mg/dL

## 2014-07-12 ENCOUNTER — Encounter: Payer: Self-pay | Admitting: *Deleted

## 2014-07-20 ENCOUNTER — Encounter: Payer: Self-pay | Admitting: Gastroenterology

## 2014-07-20 ENCOUNTER — Ambulatory Visit (AMBULATORY_SURGERY_CENTER): Payer: Managed Care, Other (non HMO) | Admitting: Gastroenterology

## 2014-07-20 VITALS — BP 143/92 | HR 64 | Temp 96.5°F | Resp 54 | Ht 59.5 in | Wt 196.0 lb

## 2014-07-20 DIAGNOSIS — R1084 Generalized abdominal pain: Secondary | ICD-10-CM

## 2014-07-20 DIAGNOSIS — K219 Gastro-esophageal reflux disease without esophagitis: Secondary | ICD-10-CM

## 2014-07-20 DIAGNOSIS — K209 Esophagitis, unspecified: Secondary | ICD-10-CM

## 2014-07-20 MED ORDER — SODIUM CHLORIDE 0.9 % IV SOLN: 500.0000 mL | INTRAVENOUS | Status: DC

## 2014-07-20 NOTE — Progress Notes (Signed)
Called to room to assist during endoscopic procedure.  Patient ID and intended procedure confirmed with present staff. Received instructions for my participation in the procedure from the performing physician.  

## 2014-07-20 NOTE — Op Note (Signed)
[facility]  [address] [place], [id number]   ENDOSCOPY PROCEDURE REPORT  PATIENT: Linda, [name]  MR#: [id number] BIRTHDATE: February 25, 1961 , 53  yrs. old GENDER: female ENDOSCOPIST: Inda Castle, MD REFERRED BY:  Arnette Norris, M.D. PROCEDURE DATE:  07/20/2014 PROCEDURE:  EGD w/ biopsy ASA CLASS:     Class II INDICATIONS:  epigastric pain. MEDICATIONS: Monitored anesthesia care and Propofol 150 mg IV TOPICAL ANESTHETIC:  DESCRIPTION OF PROCEDURE: After the risks benefits and alternatives of the procedure were thoroughly explained, informed consent was obtained.  The LB [id number] endoscope was introduced through the mouth and advanced to the second portion of the duodenum , Without limitations.  The instrument was slowly withdrawn as the mucosa was fully examined.    ESOPHAGUS: The z-line was located.  At 35cm. and was minimally irregular. Multiple biopsies were taken to rule out Barrett's esophagus.   Except for the findings listed the EGD was otherwise normal.  Retroflexed views revealed no abnormalities.     The scope was then withdrawn from the patient and the procedure completed.  COMPLICATIONS: There were no immediate complications.  ENDOSCOPIC IMPRESSION: 1.   The z-line at 36cm - r/o Barrett's esophagus 2.   EGD was otherwise normal  RECOMMENDATIONS: 1.  Await biopsy results 2.  Continue PPI 3.  hold further workup unless abdominal pain recurs  REPEAT EXAM:  eSigned:  Inda Castle, MD 07/20/2014 3:33 PM    CC:

## 2014-07-20 NOTE — Progress Notes (Signed)
A/ox3 pleased with MAC, report to Celia RN 

## 2014-07-20 NOTE — Patient Instructions (Signed)
Discharge instructions given. Biopsies taken. Resume previous medications. YOU HAD AN ENDOSCOPIC PROCEDURE TODAY AT THE [facility]: Refer to the procedure report that was given to you for any specific questions about what was found during the examination.  If the procedure report does not answer your questions, please call your gastroenterologist to clarify.  If you requested that your care partner not be given the details of your procedure findings, then the procedure report has been included in a sealed envelope for you to review at your convenience later.  YOU SHOULD EXPECT: Some feelings of bloating in the abdomen. Passage of more gas than usual.  Walking can help get rid of the air that was put into your GI tract during the procedure and reduce the bloating. If you had a lower endoscopy (such as a colonoscopy or flexible sigmoidoscopy) you may notice spotting of blood in your stool or on the toilet paper. If you underwent a bowel prep for your procedure, then you may not have a normal bowel movement for a few days.  DIET: Your first meal following the procedure should be a light meal and then it is ok to progress to your normal diet.  A half-sandwich or bowl of soup is an example of a good first meal.  Heavy or fried foods are harder to digest and may make you feel nauseous or bloated.  Likewise meals heavy in dairy and vegetables can cause extra gas to form and this can also increase the bloating.  Drink plenty of fluids but you should avoid alcoholic beverages for 24 hours.  ACTIVITY: Your care partner should take you home directly after the procedure.  You should plan to take it easy, moving slowly for the rest of the day.  You can resume normal activity the day after the procedure however you should NOT DRIVE or use heavy machinery for 24 hours (because of the sedation medicines used during the test).    SYMPTOMS TO REPORT IMMEDIATELY: A gastroenterologist can be reached at any  hour.  During normal business hours, 8:30 AM to 5:00 PM Monday through Friday, call [phone].  After hours and on weekends, please call the GI answering service at [phone] who will take a message and have the physician on call contact you.   Following upper endoscopy (EGD)  Vomiting of blood or coffee ground material  New chest pain or pain under the shoulder blades  Painful or persistently difficult swallowing  New shortness of breath  Fever of 100F or higher  Black, tarry-looking stools  FOLLOW UP: If any biopsies were taken you will be contacted by phone or by letter within the next 1-3 weeks.  Call your gastroenterologist if you have not heard about the biopsies in 3 weeks.  Our staff will call the home number listed on your records the next business day following your procedure to check on you and address any questions or concerns that you may have at that time regarding the information given to you following your procedure. This is a courtesy call and so if there is no answer at the home number and we have not heard from you through the emergency physician on call, we will assume that you have returned to your regular daily activities without incident.  SIGNATURES/CONFIDENTIALITY: You and/or your care partner have signed paperwork which will be entered into your electronic medical record.  These signatures attest to the fact that that the information above on your After Visit Summary has   has been reviewed and is understood.  Full responsibility of the confidentiality of this discharge information lies with you and/or your care-partner. 

## 2014-07-21 ENCOUNTER — Telehealth: Payer: Self-pay | Admitting: *Deleted

## 2014-07-21 NOTE — Telephone Encounter (Signed)
  Follow up Call-  Call back number 07/20/2014 10/29/2013  Post procedure Call Back phone  # [phone]  Permission to leave phone message Yes Yes    LMOM

## 2014-07-26 ENCOUNTER — Encounter: Payer: Self-pay | Admitting: Gastroenterology

## 2014-10-25 ENCOUNTER — Other Ambulatory Visit: Payer: Self-pay | Admitting: Family Medicine

## 2014-10-26 ENCOUNTER — Other Ambulatory Visit: Payer: Self-pay | Admitting: Obstetrics and Gynecology

## 2014-10-27 LAB — CYTOLOGY - PAP

## 2015-02-21 ENCOUNTER — Other Ambulatory Visit: Payer: Self-pay | Admitting: *Deleted

## 2015-02-21 MED ORDER — METFORMIN HCL 500 MG PO TABS: ORAL_TABLET | ORAL | Status: DC

## 2015-04-01 ENCOUNTER — Other Ambulatory Visit: Payer: Self-pay | Admitting: Family Medicine

## 2015-04-14 ENCOUNTER — Encounter: Payer: Self-pay | Admitting: *Deleted

## 2015-04-14 ENCOUNTER — Telehealth: Payer: Self-pay

## 2015-04-14 ENCOUNTER — Ambulatory Visit (INDEPENDENT_AMBULATORY_CARE_PROVIDER_SITE_OTHER): Payer: BC Managed Care – PPO | Admitting: Family Medicine

## 2015-04-14 ENCOUNTER — Encounter: Payer: Self-pay | Admitting: Family Medicine

## 2015-04-14 VITALS — BP 118/66 | HR 71 | Temp 98.1°F | Wt 199.8 lb

## 2015-04-14 DIAGNOSIS — E119 Type 2 diabetes mellitus without complications: Secondary | ICD-10-CM

## 2015-04-14 DIAGNOSIS — Z0000 Encounter for general adult medical examination without abnormal findings: Secondary | ICD-10-CM

## 2015-04-14 DIAGNOSIS — Z23 Encounter for immunization: Secondary | ICD-10-CM

## 2015-04-14 DIAGNOSIS — B351 Tinea unguium: Secondary | ICD-10-CM

## 2015-04-14 LAB — COMPREHENSIVE METABOLIC PANEL
ALT: 20 U/L (ref 0–35)
AST: 22 U/L (ref 0–37)
Albumin: 3.9 g/dL (ref 3.5–5.2)
Alkaline Phosphatase: 97 U/L (ref 39–117)
BILIRUBIN TOTAL: 0.5 mg/dL (ref 0.2–1.2)
BUN: 14 mg/dL (ref 6–23)
CO2: 26 meq/L (ref 19–32)
Calcium: 9.1 mg/dL (ref 8.4–10.5)
Chloride: 105 mEq/L (ref 96–112)
Creatinine, Ser: 0.73 mg/dL (ref 0.40–1.20)
GFR: 107.63 mL/min (ref >60.00)
GLUCOSE: 126 mg/dL — AB (ref 70–99)
POTASSIUM: 4.1 meq/L (ref 3.5–5.1)
SODIUM: 139 meq/L (ref 135–145)
TOTAL PROTEIN: 7.6 g/dL (ref 6.0–8.3)

## 2015-04-14 LAB — LIPID PANEL
CHOL/HDL RATIO: 3
Cholesterol: 205 mg/dL — ABNORMAL HIGH (ref 0–200)
HDL: 61.9 mg/dL (ref >39.00)
LDL Cholesterol: 129 mg/dL — ABNORMAL HIGH (ref 0–99)
NONHDL: 142.99
Triglycerides: 71 mg/dL (ref 0.0–149.0)
VLDL: 14.2 mg/dL (ref 0.0–40.0)

## 2015-04-14 LAB — MICROALBUMIN / CREATININE URINE RATIO
Creatinine,U: 143.3 mg/dL
Microalb Creat Ratio: 0.5 mg/g (ref 0.0–30.0)
Microalb, Ur: <0.7 mg/dL (ref 0.0–1.9)

## 2015-04-14 LAB — HEMOGLOBIN A1C: HEMOGLOBIN A1C: 6.7 % — AB (ref 4.6–6.5)

## 2015-04-14 MED ORDER — PHENTERMINE HCL 15 MG PO CAPS: 15.0000 mg | ORAL_CAPSULE | ORAL | Status: DC

## 2015-04-14 MED ORDER — METFORMIN HCL 500 MG PO TABS: ORAL_TABLET | ORAL | Status: DC

## 2015-04-14 MED ORDER — GLUCOSE BLOOD VI STRP: ORAL_STRIP | Status: DC

## 2015-04-14 NOTE — Assessment & Plan Note (Signed)
New- discussed OTC topical lamisil . She will try this first and get back to me if symptoms do not improve.

## 2015-04-14 NOTE — Progress Notes (Signed)
Pre visit review using our clinic review tool, if applicable. No additional management support is needed unless otherwise documented below in the visit note. 

## 2015-04-14 NOTE — Telephone Encounter (Signed)
Lm on pts vm requesting a call back. She is not able to have [facility] as she must have a 90d supply and phentermine is on a month-to-month basis.

## 2015-04-14 NOTE — Assessment & Plan Note (Signed)
Due for labs today. Continue current rx for now.  Pneumovax today.

## 2015-04-14 NOTE — Addendum Note (Signed)
Addended by: Modena Nunnery on: 04/14/2015 08:21 AM   Modules accepted: Orders
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
## 2015-04-14 NOTE — Assessment & Plan Note (Signed)
Discussed weight loss plan.  Pt would also like to discuss medication options- discussed phentermine risk benefits, side effects including HTN, pulmonary HTN, stroke.    She would like to start phentermine and lifestyle changes.  Follow up in 1 month.  If BMI < 27 will decrease to half dose x 1 month then stop   

## 2015-04-14 NOTE — Telephone Encounter (Signed)
Pt left v/m; pt was seen earlier today and got phentermine rx; pt took to [facility] but [facility] at [phone] for prior auth for phentermine so pt can get a less expensive cost. Pt request cb.

## 2015-04-14 NOTE — Patient Instructions (Signed)
Great to see you. We are starting phentermine 15 mg daily.  Please come see me in 1 month.  We will call you with your lab results.  Make you eye exam appointment.

## 2015-04-14 NOTE — Telephone Encounter (Signed)
Spoke to pt who states she has already picked up Rx and will wait until refill is due in 78mo before contacting [facility] back

## 2015-04-15 LAB — HIV ANTIBODY (ROUTINE TESTING W REFLEX): HIV: NONREACTIVE

## 2015-04-15 LAB — HEPATITIS C ANTIBODY: HCV Ab: NEGATIVE

## 2015-05-12 ENCOUNTER — Ambulatory Visit (INDEPENDENT_AMBULATORY_CARE_PROVIDER_SITE_OTHER): Payer: BC Managed Care – PPO | Admitting: Family Medicine

## 2015-05-12 ENCOUNTER — Encounter: Payer: Self-pay | Admitting: Family Medicine

## 2015-05-12 DIAGNOSIS — E119 Type 2 diabetes mellitus without complications: Secondary | ICD-10-CM | POA: Diagnosis not present

## 2015-05-12 MED ORDER — PHENTERMINE HCL 30 MG PO CAPS: 30.0000 mg | ORAL_CAPSULE | ORAL | Status: DC

## 2015-05-12 NOTE — Patient Instructions (Signed)
Good to see you. You look great. We are increasing your phentermine to 30 mg daily. Please come see me in 1 month.

## 2015-05-12 NOTE — Progress Notes (Signed)
Pre visit review using our clinic review tool, if applicable. No additional management support is needed unless otherwise documented below in the visit note. 
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
## 2015-05-12 NOTE — Assessment & Plan Note (Signed)
Improving, she would like to try an increase dose of phentermine.  She is aware of possible side effects including HTN, pulmonary HTN, stroke.    Rx for phentermine 30 mg daily given to pt.  Follow up in 1 month.  If BMI < 27 will decrease to half dose x 1 month then stop

## 2015-05-19 ENCOUNTER — Ambulatory Visit: Payer: BC Managed Care – PPO | Admitting: Family Medicine

## 2015-05-30 ENCOUNTER — Telehealth: Payer: Self-pay | Admitting: Family Medicine

## 2015-05-30 NOTE — Telephone Encounter (Signed)
[facility]  Patient Name: Linda [name]  DOB: 14-Sep-1960    Initial Comment Caller states she is on Phentermine and her dosage increased from 15mg  to 30mg  and is now having some side effects and wants to know if this is common.   Nurse Assessment  Nurse: Harlow Mares, RN, Suanne Marker Date/Time Eilene Ghazi Time): 05/30/2015 3:22:25 PM  Confirm and document reason for call. If symptomatic, describe symptoms. ---Caller states she is on Phentermine and her dosage increased from 15mg  to 30mg  and is now having some side effects and wants to know if this is common. Phentermine is being taken for weight loss. Reports that the 30mg , she feels muscle tightness in her neck. She is having relief with heating pad. Also, she is having (L) hand at the palm/5th finger is slightly numb. Reports that she is sleeping well with the increase. Denies pain in the neck/arm.  Has the patient traveled out of the country within the last 30 days? ---Not Applicable  Does the patient have any new or worsening symptoms? ---Yes  Will a triage be completed? ---Yes  Related visit to physician within the last 2 weeks? ---No  Does the PT have any chronic conditions? (i.e. diabetes, asthma, etc.) ---Yes  List chronic conditions. ---obesity, diabetes (blood sugar is stable)  Did the patient indicate they were pregnant? ---No     Guidelines    Guideline Title Affirmed Question Affirmed Notes  Neurologic Deficit [1] Tingling in hand (e.g., pins and needles) AND [2] after prolonged laying on arm AND [3] brief (now gone)    Final Disposition User   [place], RN, Suanne Marker    Comments  Caller also reports that she has been stressed (preparing for an exam) and first noted the symptom of pinky numbness when she spent several hours on the computer this weekend. Advised to call back for new or worsening symptoms. Reports that she has a follow up appt with MD later this month.   Disagree/Comply:  Comply

## 2015-06-03 NOTE — Telephone Encounter (Signed)
Any side effect is possible with any medication.  Try holding it for a few days to see if her symptoms improve.

## 2015-06-03 NOTE — Telephone Encounter (Signed)
Spoke to pt and advised per Dr Deborra Medina. Pt verbally expressed understanding and will contact office back in appx 1wk with update

## 2015-06-03 NOTE — Telephone Encounter (Signed)
Patient calling again asking about possible side effects of phentermine and muscle spasms

## 2015-06-13 ENCOUNTER — Ambulatory Visit: Payer: BC Managed Care – PPO | Admitting: Family Medicine

## 2015-06-16 ENCOUNTER — Ambulatory Visit (INDEPENDENT_AMBULATORY_CARE_PROVIDER_SITE_OTHER): Payer: BC Managed Care – PPO | Admitting: Family Medicine

## 2015-06-16 ENCOUNTER — Encounter: Payer: Self-pay | Admitting: Family Medicine

## 2015-06-16 MED ORDER — PHENTERMINE HCL 30 MG PO CAPS: 30.0000 mg | ORAL_CAPSULE | ORAL | Status: DC
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
## 2015-06-16 NOTE — Patient Instructions (Signed)
Good to see you. Please consider going for massage therapy.  Please come see min 3 months.

## 2015-06-16 NOTE — Progress Notes (Signed)
Pre visit review using our clinic review tool, if applicable. No additional management support is needed unless otherwise documented below in the visit note. 

## 2015-06-16 NOTE — Assessment & Plan Note (Signed)
Improving. She would like to continue phentermine at current dose- 30 mg daily. She will continue to monitor for side effects and is aware of risks of phentermine usage. Follow up in 3 months. The patient indicates understanding of these issues and agrees with the plan.

## 2015-06-17 ENCOUNTER — Telehealth: Payer: Self-pay

## 2015-06-17 NOTE — Telephone Encounter (Signed)
Pt left v/m; pt was notified by [facility] waiting on prior auth for phentermine. Pt request cb with status of PA.

## 2015-06-17 NOTE — Telephone Encounter (Signed)
Pt left v/m; pt cannot get taken care of [facility]; pt request to pick up rx on 06/20/15. Pt request cb when rx ready for pick up.[facility].

## 2015-06-17 NOTE — Telephone Encounter (Signed)
Spoke to pt and advised her that we received the form this am, but unfortunately, she does not meet requirements for coverage. Advised pt that she will have to let [facility] know she is going to pay out of pocket for med. Pt states she will contact mail order and verify price of medication. If economic, she will have filled through them, if not, she will contact office back to pickup [facility]. Advised pt that Rx would not be available for pickup until Monday after Dr Hulen Shouts RTO

## 2015-06-17 NOTE — Telephone Encounter (Signed)
Ok to refill as pt requests.

## 2015-06-20 NOTE — Telephone Encounter (Signed)
Spoke to pt and informed her Rx is available for pickup from the front desk. Original printed Rx given to pt

## 2015-09-21 ENCOUNTER — Ambulatory Visit: Payer: BC Managed Care – PPO | Admitting: Family Medicine

## 2015-10-10 ENCOUNTER — Ambulatory Visit: Payer: BC Managed Care – PPO | Admitting: Family Medicine

## 2015-10-11 ENCOUNTER — Encounter: Payer: Self-pay | Admitting: Family Medicine

## 2015-10-11 ENCOUNTER — Ambulatory Visit (INDEPENDENT_AMBULATORY_CARE_PROVIDER_SITE_OTHER): Payer: BC Managed Care – PPO | Admitting: Family Medicine

## 2015-10-11 DIAGNOSIS — K219 Gastro-esophageal reflux disease without esophagitis: Secondary | ICD-10-CM

## 2015-10-11 DIAGNOSIS — R0789 Other chest pain: Secondary | ICD-10-CM | POA: Diagnosis not present

## 2015-10-11 DIAGNOSIS — R079 Chest pain, unspecified: Secondary | ICD-10-CM | POA: Insufficient documentation

## 2015-10-11 MED ORDER — PHENTERMINE HCL 30 MG PO CAPS: 30.0000 mg | ORAL_CAPSULE | ORAL | Status: DC

## 2015-10-11 NOTE — Assessment & Plan Note (Signed)
Restart prilosec for two weeks to see if that is also contributing to her chest pain. The patient indicates understanding of these issues and agrees with the plan.

## 2015-10-11 NOTE — Assessment & Plan Note (Signed)
She has been doing very well with life style changes and phentermine. Tolerating this well and I do not believe the chest pain is from the phentermine.  ?MSK- see above. Rx printed- she is aware of the risks.  Follow up in 3 months. The patient indicates understanding of these issues and agrees with the plan.
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
## 2015-10-11 NOTE — Patient Instructions (Signed)
Great to see you. Your EKG looked great. I think this may be a muscle strain.  I also want you to restart your prilosec to see if that will help. Keep me updated.

## 2015-10-11 NOTE — Assessment & Plan Note (Signed)
New- intermittent.  EKG reassuring- NSR. ? Muscle strain. Advised heat/NSAIDs. Call or return to clinic prn if these symptoms worsen or fail to improve as anticipated. The patient indicates understanding of these issues and agrees with the plan.

## 2015-11-08 ENCOUNTER — Other Ambulatory Visit: Payer: Self-pay | Admitting: Obstetrics & Gynecology

## 2015-11-08 DIAGNOSIS — R928 Other abnormal and inconclusive findings on diagnostic imaging of breast: Secondary | ICD-10-CM

## 2015-11-14 ENCOUNTER — Ambulatory Visit
Admission: RE | Admit: 2015-11-14 | Discharge: 2015-11-14 | Disposition: A | Discharge status: Home / Self Care | Payer: BC Managed Care – PPO | Source: Ambulatory Visit | Attending: Obstetrics & Gynecology | Admitting: Obstetrics & Gynecology

## 2015-11-14 DIAGNOSIS — R928 Other abnormal and inconclusive findings on diagnostic imaging of breast: Secondary | ICD-10-CM

## 2016-01-05 ENCOUNTER — Ambulatory Visit: Payer: BC Managed Care – PPO | Admitting: Family Medicine

## 2016-01-05 ENCOUNTER — Telehealth: Payer: Self-pay | Admitting: Family Medicine

## 2016-01-05 ENCOUNTER — Telehealth: Payer: Self-pay

## 2016-01-05 DIAGNOSIS — Z0289 Encounter for other administrative examinations: Secondary | ICD-10-CM

## 2016-01-05 NOTE — Telephone Encounter (Signed)
Ok to cancel

## 2016-01-05 NOTE — Telephone Encounter (Signed)
PLEASE NOTE: All timestamps contained within this report are represented as [place] Standard Time. CONFIDENTIALTY NOTICE: This fax transmission is intended only for the addressee. It contains information that is legally privileged, confidential or otherwise protected from use or disclosure. If you are not the intended recipient, you are strictly prohibited from reviewing, disclosing, copying using or disseminating any of this information or taking any action in reliance on or regarding this information. If you have received this fax in error, please notify us immediately by telephone so that we can arrange for its return to [place]. Phone: [phone], Toll-Free: [phone], Fax: [phone] Page: 1 of 1 Call Id: [id number] [place] Night - Client Nonclinical Telephone Record [place] Night - Client Client Site [facility] Physician Arnette Norris - MD Contact Type Call Who Is Calling Patient / Member / Family / Caregiver Caller Name Katherene Sartini Caller Phone Number [phone] Patient Name Linda [name] Call Type Message Only Information Provided Reason for Call Request to [facility] Appointment Initial Comment Caller states she has a 7:15 AM appt - she will not able to make it Additional Comment Call Closed By: Salvatore Marvel Transaction Date/Time: 01/04/2016 5:20:04 PM (ET)

## 2016-01-05 NOTE — Telephone Encounter (Signed)
Patient did not come for their scheduled appointment today for 3 month follow up Please let me know if the patient needs to be contacted immediately for follow up or if no follow up is necessary.   ° °

## 2016-01-14 LAB — HM DIABETES EYE EXAM

## 2016-01-16 ENCOUNTER — Ambulatory Visit (INDEPENDENT_AMBULATORY_CARE_PROVIDER_SITE_OTHER): Payer: BC Managed Care – PPO | Admitting: Family Medicine

## 2016-01-16 ENCOUNTER — Encounter: Payer: Self-pay | Admitting: Family Medicine

## 2016-01-16 VITALS — BP 132/78 | HR 75 | Temp 98.0°F | Wt 184.5 lb

## 2016-01-16 DIAGNOSIS — R42 Dizziness and giddiness: Secondary | ICD-10-CM

## 2016-01-16 NOTE — Assessment & Plan Note (Signed)
New- ? Vertigo with driving only. Advised cleaning wax out of ears- see AVS. Driving during day time only for now and with someone else until symptoms resolve. Call or return to clinic prn if these symptoms worsen or fail to improve as anticipated. The patient indicates understanding of these issues and agrees with the plan.
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
## 2016-01-16 NOTE — Progress Notes (Signed)
Pre visit review using our clinic review tool, if applicable. No additional management support is needed unless otherwise documented below in the visit note. 

## 2016-01-16 NOTE — Patient Instructions (Signed)
Use dilute hydrogen peroxide (1:1 with water) a few drops in both ears to help break down ear wax. You can also use debrox over the counter.  Happy birthday!

## 2016-01-17 ENCOUNTER — Ambulatory Visit (INDEPENDENT_AMBULATORY_CARE_PROVIDER_SITE_OTHER): Payer: BC Managed Care – PPO | Admitting: Family Medicine

## 2016-01-17 ENCOUNTER — Encounter: Payer: Self-pay | Admitting: Family Medicine

## 2016-01-17 VITALS — BP 140/88 | HR 78 | Temp 97.9°F | Wt 185.2 lb

## 2016-01-17 DIAGNOSIS — H6120 Impacted cerumen, unspecified ear: Secondary | ICD-10-CM | POA: Diagnosis not present

## 2016-01-17 NOTE — Progress Notes (Signed)
Pre visit review using our clinic review tool, if applicable. No additional management support is needed unless otherwise documented below in the visit note. 
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
## 2016-01-17 NOTE — Assessment & Plan Note (Signed)
New- right ear.   Wax is removed by syringing and manual debridement. Instructions for home care to prevent wax buildup are given.

## 2016-01-18 ENCOUNTER — Telehealth: Payer: Self-pay | Admitting: Family Medicine

## 2016-01-18 NOTE — Telephone Encounter (Signed)
[place]  Patient Name: Linda [name]  DOB: 19-May-1961    Initial Comment Caller states has been seen for vertigo; wants to know if something can take; has to drive to [place] daily;    Nurse Assessment      Guidelines    Guideline Title Affirmed Question Affirmed Notes       Final Disposition User   FINAL ATTEMPT MADE - message left [place], Therapist, sports, Tillie Rung

## 2016-01-18 NOTE — Telephone Encounter (Signed)
Spoke to pt and advised 

## 2016-01-18 NOTE — Telephone Encounter (Signed)
Meclizine 25 mg PO TID- can be purchased OTC

## 2016-01-19 NOTE — Telephone Encounter (Signed)
Agree with advise given.
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
## 2016-01-19 NOTE — Telephone Encounter (Signed)
Please call to check on pt. 

## 2016-01-20 NOTE — Telephone Encounter (Signed)
Spoke to pt who states her BP was normal and BS was 128, and states she is "feel ok," but has been unable to drive. Pt has neuro appt 6/12

## 2016-01-23 ENCOUNTER — Encounter: Payer: Self-pay | Admitting: Neurology

## 2016-01-23 ENCOUNTER — Ambulatory Visit (INDEPENDENT_AMBULATORY_CARE_PROVIDER_SITE_OTHER): Payer: BC Managed Care – PPO | Admitting: Neurology

## 2016-01-23 VITALS — BP 150/98 | HR 71 | Resp 16 | Ht 60.0 in | Wt 182.0 lb

## 2016-01-23 DIAGNOSIS — R42 Dizziness and giddiness: Secondary | ICD-10-CM | POA: Diagnosis not present

## 2016-01-23 DIAGNOSIS — R410 Disorientation, unspecified: Secondary | ICD-10-CM | POA: Diagnosis not present

## 2016-01-23 DIAGNOSIS — G4710 Hypersomnia, unspecified: Secondary | ICD-10-CM

## 2016-01-23 DIAGNOSIS — R0683 Snoring: Secondary | ICD-10-CM | POA: Diagnosis not present

## 2016-01-23 NOTE — Telephone Encounter (Signed)
Pt saw neurologist today and was waste of time and money;pt is having dizziness night and day.pt has pressure on lt side of head; ? Sinus problems. Pt wants to know if needs to see ENT for possible vertigo. Pt was prescribed Meclizine at [facility]. Meclizine makes pt sleepy; pt has to drive from [place] to [place] with work.pt feels disorientated at times. Pt wants to know if should be out of work until Dr [employer] return. [facility] [address]pt has appt with Dr Deborra Medina on 01/26/16 at 7:15am. Dr Deborra Medina out of office and sending to Avie Echevaria NP.

## 2016-01-23 NOTE — Patient Instructions (Addendum)
Please remember, that vertigo-type symptoms can recur without warning, triggers could be stress, sleep deprivation, dehydration and even taking a bumpy car ride (or roller coaster ride), sometimes an acute illness, even a common (viral) cold. It can last hours or days. Please change positions slowly and always stay well-hydrated. Physical therapy with particular attention to vestibular rehabilitation can be very helpful. While there is no specific medication that helps with vertigo, some people get relief with as needed use of meclizine. Certain medications can exacerbate vertigo.   We will do a brain scan, called MRI and call you with the test results. We will have to schedule you for this on a separate date. This test requires authorization from your insurance, and we will take care of the insurance process.  We will do an EEG (brainwave test), which we will schedule. We will call you with the results.  Dizziness is rather non specific and may come and go without clear reason and resolve on its own at times.   Based on your symptoms and your exam I believe you are at risk for obstructive sleep apnea or OSA, and I think we should proceed with a sleep study to determine whether you do or do not have OSA and how severe it is. If you have more than mild OSA, I want you to consider treatment with CPAP. Please remember, the risks and ramifications of moderate to severe obstructive sleep apnea or OSA are: Cardiovascular disease, including congestive heart failure, stroke, difficult to control hypertension, arrhythmias, and even type 2 diabetes has been linked to untreated OSA. Sleep apnea causes disruption of sleep and sleep deprivation in most cases, which, in turn, can cause recurrent headaches, problems with memory, mood, concentration, focus, and vigilance. Most people with untreated sleep apnea report excessive daytime sleepiness, which can affect their ability to drive. Please do not drive if you feel  sleepy.   I will likely see you back after your sleep study to go over the test results and where to go from there. We will call you after your sleep study to advise about the results (most likely, you will hear from Beverlee Nims, my nurse) and to set up an appointment at the time, as necessary.    Our sleep lab administrative assistant, Arrie Aran will meet with you or call you to schedule your sleep study. If you don't hear back from her by next week please feel free to call her at [phone]. This is her direct line and please leave a message with your phone number to call back if you get the voicemail box. She will call back as soon as possible.
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
## 2016-01-23 NOTE — Telephone Encounter (Signed)
I would have her stay out of work until she see's Dr. Deborra Medina on the 15th

## 2016-01-24 ENCOUNTER — Telehealth: Payer: Self-pay | Admitting: Family Medicine

## 2016-01-24 NOTE — Telephone Encounter (Signed)
Pt called stating she needs a work note dated 6/12 through 6/15 Please advise pt when ready for pick

## 2016-01-24 NOTE — Telephone Encounter (Signed)
Pt will receive note when she attends 6/15 appt

## 2016-01-24 NOTE — Telephone Encounter (Signed)
Spoke to pt and advised 

## 2016-01-26 ENCOUNTER — Telehealth: Payer: Self-pay | Admitting: Family Medicine

## 2016-01-26 ENCOUNTER — Ambulatory Visit (INDEPENDENT_AMBULATORY_CARE_PROVIDER_SITE_OTHER): Payer: BC Managed Care – PPO | Admitting: Family Medicine

## 2016-01-26 ENCOUNTER — Encounter: Payer: Self-pay | Admitting: Family Medicine

## 2016-01-26 VITALS — BP 114/68 | HR 72 | Temp 97.9°F | Wt 185.2 lb

## 2016-01-26 DIAGNOSIS — Z7689 Persons encountering health services in other specified circumstances: Secondary | ICD-10-CM

## 2016-01-26 DIAGNOSIS — H8110 Benign paroxysmal vertigo, unspecified ear: Secondary | ICD-10-CM

## 2016-01-26 DIAGNOSIS — R42 Dizziness and giddiness: Secondary | ICD-10-CM | POA: Diagnosis not present

## 2016-01-26 NOTE — Telephone Encounter (Signed)
We can certainly try vestibular rehab.  Referral placed.
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
## 2016-01-26 NOTE — Progress Notes (Signed)
Pre visit review using our clinic review tool, if applicable. No additional management support is needed unless otherwise documented below in the visit note. 

## 2016-01-26 NOTE — Telephone Encounter (Signed)
Pt is asking if vertigo therapy would be useful and if you can refer her to a provider. Please call [phone].

## 2016-01-26 NOTE — Assessment & Plan Note (Signed)
Improving some. Agree with MRI and EEG, ? Sleep study. Saw ENT yesterday. Continue current treatment. Wrote work note for pt, she will apply for [employer]. Call or return to clinic prn if these symptoms worsen or fail to improve as anticipated.

## 2016-01-27 ENCOUNTER — Ambulatory Visit: Payer: BC Managed Care – PPO | Attending: Family Medicine | Admitting: Physical Therapy

## 2016-01-27 DIAGNOSIS — R42 Dizziness and giddiness: Secondary | ICD-10-CM | POA: Insufficient documentation

## 2016-01-27 NOTE — Telephone Encounter (Signed)
Triage received vm. Went to [facility] today and is going to have to pay out of pocket at their facility. They told her she needs to go to [facility] and she wouldn't have to pay as much.  [place]: [place]: [phone]  They need referral for her upcoming appt on Tues 01/31/16 @ 6pm.  Pt aware/referral faxed

## 2016-01-30 NOTE — Therapy (Signed)
[facility] [address] [place], [place], [address] Phone: [phone]   Fax:  [phone]  Patient Details  Name: Linda [name] MRN: [id number] Date of Birth: 03-28-1961 Referring Provider:  Lucille Passy, MD  Encounter Date: 01/27/2016  PT evaluation not initiated, per patient request, due to patient uncertainty of insurance coverage.  Billie Ruddy, PT, DPT [facility] [address] [place], [place], [address] Phone: [phone]   Fax:  [phone] 01/30/2016, 7:54 AM

## 2016-02-01 ENCOUNTER — Other Ambulatory Visit: Payer: BC Managed Care – PPO

## 2016-02-01 ENCOUNTER — Telehealth: Payer: Self-pay | Admitting: Neurology

## 2016-02-01 ENCOUNTER — Telehealth: Payer: Self-pay

## 2016-02-01 NOTE — Telephone Encounter (Signed)
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 Pt request cb.

## 2016-02-01 NOTE — Telephone Encounter (Signed)
Patient notified as instructed by telephone and verbalized understanding. 

## 2016-02-01 NOTE — Telephone Encounter (Signed)
Patient is calling and states she doesn't understand why she was directed to have a sleep study test.  Her physical therpist diagnoised bestibulo ocular reflex.  Patient was very upset and needs a call back.

## 2016-02-01 NOTE — Telephone Encounter (Signed)
That is such a tough question to answer.  Most likely she just needs to continue with therapy but there are more worrisome causes (not very likely) that can cause vertigo so brain imaging would be prudent.  I would go ahead and have the MRI to be on the safe side.

## 2016-02-01 NOTE — Telephone Encounter (Signed)
Patient called to say that she went to PT (sent by PCP) for dizziness and was told that she has positive vestibulo-ocular reflex. Patient feels that the diagnosis fits her symptoms. Naylah asked me why did we not diagnose her with this? I tried to explain to her the process and we are trying to rule out other sinister causes of dizziness. She is agreeable to MRI and EEG. Patient would like to postpone the sleep study for now and wants to concentrate on the dizziness. She asked me to call her back with explanation of why she received no diagnosis from here and where to go from here.

## 2016-02-01 NOTE — Telephone Encounter (Signed)
Please call patient back: We still don't have a diagnosis, please explain to patient, that vestibulo-ocular reflex is a physical finding, not a diagnosis. We will continue workup as planned to help clarify symptoms. Would recommend sleep study too. Continue PT, as it has helped. Glad she is feeling better!

## 2016-02-02 NOTE — Telephone Encounter (Signed)
I had a very nice phone conversation with Dr. Deborra Medina, PCP, this morning. I would like to see how we can help this lady without causing increase in frustration. I'm not quite sure what else I can do. Again, her history was not suggestive of vertigo and I suggested neurological workup in the form of MRI, EEG and sleep study testing as different issues can cause symptoms of dizziness, foggy headedness, decreased attention, microsleep, etc. Her history did not suggest benign positional vertigo but I did suggest ENT evaluation. Furthermore, she may benefit from cardiology evaluation eventually as asymptomatic bradycardia can also cause presyncope type changes and drop in BP can cause lightheadedness. All in all, if she benefits from physical therapy she should continue. I would recommend completion of workup. If she would like to see a different provider, I suggested to Dr. Deborra Medina, that they can discuss referral to a different practice. From my end of things, I have certainly not quite completed my workup. On the reassuring side, her exam was benign during our recent visit, including no vertigo reported, no nystagmus, no orthostatic hypotension, no abnormal movements, no weakness.  I would be happy to see her again once the testing is completed. I am not sure, what else to offer. I do feel bad that the patient is frustrated and unhappy. I think, due to better rapport with the patient, it will be best if Dr. Deborra Medina speak with the patient again. She agreed and will call the patient.

## 2016-02-02 NOTE — Telephone Encounter (Signed)
I called patient back and gave her statement below. Patient immediately appeared to be frustrated by this. She stated that she is an educated woman and feels like Dr. Rexene Alberts is trying "to pull one over" on her, stating "she is no better than me". I tried to explain what a diagnosis is and Linda [name] told me that I was wrong, that vestibulo-ocular reflex is a diagnosis. She continued to tell me that she is educated and she expects to know why Dr. Rexene Alberts did not perform this maneuver on her. I spent about 20 minutes on the phone with the patient. Linda [name] continued to explain her frustrations and demanded to know an answer to question above. She stated that Dr. Rexene Alberts is "like congress" and "is avoiding the real issue". Patient asked to speak with office manager. I advised her that she is out of the office this week but I will pass the message along. Patient asks to be seen by a different provider. Linda [name] will keep her appts for MRI and EEG.

## 2016-02-06 ENCOUNTER — Telehealth: Payer: Self-pay | Admitting: Neurology

## 2016-02-06 NOTE — Telephone Encounter (Signed)
Patient is calling to schedula a new MRI.  Please call.  Thanks!

## 2016-02-07 NOTE — Telephone Encounter (Signed)
Patient called to follow up on message left yesterday regarding scheduling MRI. Please call [phone].

## 2016-02-07 NOTE — Telephone Encounter (Signed)
Patient called again to check status of scheduling MRI

## 2016-02-08 ENCOUNTER — Telehealth: Payer: Self-pay

## 2016-02-08 ENCOUNTER — Telehealth: Payer: Self-pay | Admitting: Neurology

## 2016-02-08 NOTE — Telephone Encounter (Signed)
EEG at [facility] has also been cancelled. (originally scheduled for 02/15/16)

## 2016-02-08 NOTE — Telephone Encounter (Signed)
Thanks for all you do for our patients, Linda [name].  Vaughan Basta, please help.  This is way outside my level of expertise.  I don't handle scheduling or insurance issues.

## 2016-02-08 NOTE — Telephone Encounter (Signed)
Spoke with patient who called back to check the status of no show fee. I told her that our billing department was out of office and I would call her back.

## 2016-02-08 NOTE — Telephone Encounter (Signed)
Spoke with Danielle at [facility] again.  Unfortunately, pt is no longer a patient at [facility] with Dr. Rexene Alberts. Danielle stated that through staff messages between Dr. Deborra Medina and Dr. Rexene Alberts it would be best for pt to see someone else as well as per pt request. They Cannot waive the $50 no show fee now because the pt is no longer their pt. I asked Andee Poles if she could have Dr. Rexene Alberts cancel the current MRI Brain w/ and w/o contrast order so that the new neurologist can order. Danielle also stated that pt would not need to go to [facility] because only [facility] MD's read the reports so [facility] would be best option.   Pt will need new referral to a new neurologist.   FYI to Dr. Darnelle Spangle, I will let you call the patient tomorrow. Thanks

## 2016-02-08 NOTE — Telephone Encounter (Signed)
I'm not sure I understand her question.  Please call pt to get more information.

## 2016-02-08 NOTE — Telephone Encounter (Signed)
Dr. Aleda Grana office called and said they have been speaking with the patient regarding no show fee and MRI. I spoke with Dr. Rexene Alberts and Beverlee Nims who told me that the patient was no longer a patient her. I called Dr. Aleda Grana office and spoke with Ebony Hail to let her know a new order would need to be put in. Dr. Aleda Grana office will handle care from this point on.   PHONE STAFF: if this patient calls please contact [place].

## 2016-02-08 NOTE — Telephone Encounter (Signed)
Pt left v/m; pt having difficulty getting MRI scheduled; pt wants to know if Dr Deborra Medina could schedule with [facility]. Please see phone note to Dr Rexene Alberts dated 02/06/16. Pt saw doctor earlier this week and wants to know how Dr Deborra Medina will get information from specialist pt saw. Pt request cb.

## 2016-02-08 NOTE — Telephone Encounter (Signed)
Spoke to pt. She is frustrated. She gave me a brief run down of what is happening. She was talking very fast so it was hard to follow.  Please see previous phone notes. She cannot reschedule MRI until she pays a no show fee to [facility] for the missed appt on 02/01/16. She said she did not "no show", she called and cancelled and talked to someone about cancelling the appt due to her Physical Therapist suggesting occular reflex. Pt is out of work and continuing to have dizziness.   I'm not quiet sure what to do here. I explained to pt that I will make some calls to see what can be done so that we can get the MRI complete. Pt would like to go to [place]. I informed pt that I was out of the office starting tomorrow 6/29-02/15/16. I told her I would let my front office supervisor, Barbera Setters, know the situation so she can be in the loop to handle any concerns while I'm out.  Left message at [facility] for Neldon Labella to call back about fee.   I'm not sure if we can go to [place] after already have insurance approve MRI at [facility]?? I don't know expiration date of authorization either??

## 2016-02-08 NOTE — Telephone Encounter (Signed)
This is my staff message to Dr. Deborra Medina on 02/02/16: "Thank you very much, I am not sure what happened, we had a good visit (so I thought....). Extended visit too, talked about all the possible DDx for dizziness/lightheadedness/spells.  Husband was there too, was concerned about her sleep deprivation, snoring, etc. Hence the PSG recommendation.  This was going in the no-win direction, Beverlee Nims, my nurse is super nice and loved by all patients, never gets upset, but did get the brunt of the patient's frustration, over what I still don't know...  Thanks so much. Sorry about this.  Tyresa Prindiville "

## 2016-02-08 NOTE — Telephone Encounter (Signed)
Returned patients call to schedule MRI she did not answer so I left a VM asking her to return my call. This patient no showed her last apt and will have to pay that fee before scheduling. Please relay her to angie before taking a message. Thanks

## 2016-02-08 NOTE — Telephone Encounter (Signed)
Spoke with Andee Poles at [facility], they will waive the $50 no show fee as a one time courtesy for pt.  Pt did call to cancel, but without 24 hour notice, they can will reschedule pt for next week.  Spoke with patient and explained, she was very calm and understanding of the 24 hour notice.  Explained that [facility]  Is handling NS fee for her and for continuity of care, they can reschedule more efficiently than another imaging facility, also has already been precerted [facility] facility.   Pt was very compliant.  Informed Danielle at [facility], she will contact pt and get her rescheduled.

## 2016-02-08 NOTE — Telephone Encounter (Signed)
I did talk to Dr. Deborra Medina last week, and after she talked to the patient, she sent me a staff message: "I spoke with patient and she does plan on going to another practice which is probably best for everyone! I"m not sure why she got so worked up but she did seem more calm.  Take care,  Talia "  I sent Dr. Deborra Medina a staff message back, thanking her and apologizing that the patient was unhappy. She will need diagnostic testing reordered as deemed appropriate by her next specialist. We will have to forego tests here (which includes the MRI, EEG and PSG). While I did not dismiss the patient, I would like to stress the fact that the patient chose to seek consultation elsewhere. I have seen her once and ordered diagnostic testing in good faith to try to help her get to the bottom of her symptoms, which were not in keeping with positional vertigo, nor orthostatic hypotension nor seizure nor migraine nor TIA - hence the w/u I suggested. I do wish her all the best and that she feels better soon.

## 2016-02-09 NOTE — Telephone Encounter (Signed)
Spoke to [facility], they will get pt scheduled with first available provider, spoke with pt, she understands that [facility] will call to get her scheduled with their office.  Pt wanted to thank PCP for all her  Help / lt

## 2016-02-10 NOTE — Telephone Encounter (Signed)
Pt left msg on my desk phone, returned her call, she wanted to notify me about her upcoming appointment with Dr. Delice Lesch and wanted to make sure her EEG was canceled through [facility] and it was.  She was also concerned about $50 no show fee with their office, I told her she needed to talk with them about the fee, it may still be owed.

## 2016-02-15 ENCOUNTER — Other Ambulatory Visit: Payer: BC Managed Care – PPO

## 2016-02-17 ENCOUNTER — Telehealth: Payer: Self-pay | Admitting: Family Medicine

## 2016-02-17 NOTE — Telephone Encounter (Signed)
Pt dropped of fmla paperwork Pt stated she has been out of work since 01/19/16 for vestibula ocular reflux disorder She stated she has had PT 2 x week for last month Starting PT 1 x week starting 7/3 The Physical therapist is going to reevaluate her 7/11 and faxed over results 7/12 Pt would like to have paperwork back to be able to turn in to her employer before 7/14 or they will increase her cost of health insurance   Please give paperwork back to [employer]ision next week i will be out of the office

## 2016-02-17 NOTE — Telephone Encounter (Signed)
In dr aron's in box

## 2016-02-20 NOTE — Telephone Encounter (Signed)
Spoke to pt. She has PT tomorrow night 7/11. Pt wants [place] to wait to fill out # 5, 6, and 7 on FMLA forms to determine end dates. Will wait for report.  ((Pt will pick up forms once complete and turn them in herself))

## 2016-02-21 ENCOUNTER — Telehealth: Payer: Self-pay | Admitting: Neurology

## 2016-02-21 NOTE — Telephone Encounter (Signed)
I called the patient. No answer. Left a message.

## 2016-02-21 NOTE — Telephone Encounter (Signed)
Pt called in to speak with office manager. I skyped Megan to see if she could take call.  Pt said she questioned some suggestions that Dr. Rexene Alberts asked her to do. She said she never got any answers from Dr. Rexene Alberts. Pt had appt for MRI and cancelled. She wanted to know more about why she needed the MRI. She was then charged a r/s fee of $50. Pt said there is more to it and does not want to get into all. She would like to speak with manager and probably change physicians.

## 2016-02-21 NOTE — Telephone Encounter (Signed)
Patient returned Megan's call. Please call [phone].

## 2016-02-22 NOTE — Telephone Encounter (Addendum)
Late entry: I spoke to the patient on July 11 at 4:30 PM. The patient is upset about our $50 no-show fee. Patient was charged this for missing MRI appointment. She is also upset that all of her tests were canceled and she was rescheduled at a another neurology office. I did review the notes and explained to the patient that per her PCP- the patient requested to see someone else.  Her PCP and Dr. Rexene Alberts spoke and they both agreed with this. At that time all test was canceled with our office and she was referred by her pcp to [facility]. Patient denies these conversations took place She states that she cannot get in with [facility] for 1 month and therefore would like to be seen at our office by another neurologist. I did advise that per our policy we typically do not allow this. Also explained that it is important to have trust between the patient and the provider in order to establish a good working relationship. Ms. Strojny had expressed some concern to Dr. Guadelupe Sabin nurse in regards to the care she received from Dr Rexene Alberts. At this time her primary care and Dr. Rexene Alberts felt that she would be better served by another neurologist.   The patient became very upset that we are not seeing her and stated that she would like her co-pay back. I explained that we cannot do that Dr. Rexene Alberts did examine the patient and make recommendations. I advised that I would bring this to the attention of Dr. Rexene Alberts and our medical director Dr. Leta Baptist. Ultimately she should consult with her primary care and keep her appointment with [facility] neurology.

## 2016-02-23 NOTE — Telephone Encounter (Signed)
Forms faxed to Chad Cordial at [phone] per pt request. Also faxed to # on FMLA forms [phone].  Pt notified forms were faxed and copy at front desk for pt to pick up (may get her mother Rudean Haskell to pick up). Copy to scan Copy for office file. Copy to charge.

## 2016-02-23 NOTE — Telephone Encounter (Signed)
Physical Therapy notes with FMLA forms in Dr. Deborra Medina Inbox. Please review notes and determine only # 6 and 7.  Please return to [facility]

## 2016-02-23 NOTE — Telephone Encounter (Signed)
Fax # to Chad Cordial is incorrect. Pt is aware she said not to worry about it.  Pt called and said that # 5 B needs to be complete with an actual end date and will let us know what her Physical Therapist says to put down. Will change date when pt calls me back

## 2016-02-23 NOTE — Telephone Encounter (Signed)
Form signed and in my box. 

## 2016-02-23 NOTE — Telephone Encounter (Signed)
Pt called to confirm if we received notes. Pt wanted to let Dr. Deborra Medina know if possible can form be complete today 02/23/16 as this is a time sensititive matter and needs to be turned in tomorrow 7/14.

## 2016-02-24 NOTE — Telephone Encounter (Signed)
Yes ok to use August 18th

## 2016-02-24 NOTE — Telephone Encounter (Signed)
Pt called back. Her physical therapist recommends putting August 18th down for end date. Do you feel comfortable using this date? Please advise

## 2016-02-28 NOTE — Telephone Encounter (Signed)
August 18 added as end date and refaxed to pt's employer

## 2016-02-29 NOTE — Telephone Encounter (Signed)
Form completed and in my box.

## 2016-02-29 NOTE — Telephone Encounter (Signed)
[employer] faxed paperwork needing additional information In dr Deborra Medina box

## 2016-03-01 NOTE — Telephone Encounter (Signed)
Form faxed

## 2016-03-16 ENCOUNTER — Encounter: Payer: Self-pay | Admitting: Neurology

## 2016-03-16 ENCOUNTER — Ambulatory Visit (INDEPENDENT_AMBULATORY_CARE_PROVIDER_SITE_OTHER): Payer: BC Managed Care – PPO | Admitting: Neurology

## 2016-03-16 VITALS — BP 120/84 | HR 69 | Ht 60.0 in | Wt 189.2 lb

## 2016-03-16 DIAGNOSIS — R42 Dizziness and giddiness: Secondary | ICD-10-CM
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
## 2016-03-16 NOTE — Patient Instructions (Addendum)
1. Schedule MRI brain with and without contrast. We have sent a referral to [facility] for your MRI and they will call you directly to schedule your appt. They are located at [place]. If you need to contact them directly please call [phone]. 2. Continue with physical therapy and home vestibular exercises 3. Follow-up in 3 months, call for any changes

## 2016-03-23 ENCOUNTER — Telehealth: Payer: Self-pay | Admitting: Family Medicine

## 2016-03-23 NOTE — Telephone Encounter (Signed)
Pt dropped off STD paperwork She stated her PT notes would be faxed to you She stated her Therapist wanted her to stay out of work till 04/12/16.  I put this as end date  In dr Deborra Medina in box For  Review and completion

## 2016-03-26 ENCOUNTER — Ambulatory Visit
Admission: RE | Admit: 2016-03-26 | Discharge: 2016-03-26 | Disposition: A | Discharge status: Home / Self Care | Payer: BC Managed Care – PPO | Source: Ambulatory Visit | Attending: Neurology | Admitting: Neurology

## 2016-03-26 DIAGNOSIS — R42 Dizziness and giddiness: Secondary | ICD-10-CM

## 2016-03-26 DIAGNOSIS — Z7689 Persons encountering health services in other specified circumstances: Secondary | ICD-10-CM

## 2016-03-26 MED ORDER — GADOBENATE DIMEGLUMINE 529 MG/ML IV SOLN
17.0000 mL | Freq: Once | INTRAVENOUS | Status: AC | PRN
  Administered 2016-03-26: 17 mL via INTRAVENOUS

## 2016-03-26 NOTE — Telephone Encounter (Signed)
Spoke to pt she is aware she needs to fill out medica release before I can send medical records to [employer] On my desk blue folder

## 2016-03-26 NOTE — Telephone Encounter (Signed)
Dr Deborra Medina Can you help with filling out forms  Form 1) Needs questions 6-7-8 Form 2) Work release dates Form 3) Needs questions 3-4-5-6  Thanks IN Dr Hulen Shouts IN BOX

## 2016-03-26 NOTE — Telephone Encounter (Signed)
Question completed to the best of my knowledge and in my box.

## 2016-03-27 ENCOUNTER — Telehealth: Payer: Self-pay | Admitting: *Deleted

## 2016-03-27 NOTE — Telephone Encounter (Signed)
Entered in error

## 2016-03-28 ENCOUNTER — Telehealth: Payer: Self-pay | Admitting: Neurology

## 2016-03-28 NOTE — Telephone Encounter (Signed)
Linda [name] [birth year]. She was calling about her MRI results. Her # is [phone]. Thank you

## 2016-03-28 NOTE — Telephone Encounter (Signed)
Pt called stating she received email form her employer stating something was missing on paperwork She gave me her contact name and number  I left message for Chad Cordial to call be back so we can discuss paperwork [phone]

## 2016-03-28 NOTE — Telephone Encounter (Signed)
Linda [name] September 03, 2060. She had a few additional questions for you regarding her MRI. Her # is [id number].

## 2016-03-28 NOTE — Telephone Encounter (Signed)
Notified pt of results.  Patient requested a copy to be mailed to her.  Copy printed and mailed as requested

## 2016-03-28 NOTE — Telephone Encounter (Signed)
Paperwork faxed Copy for file Copy for scan Copp for pt Pt aware

## 2016-03-29 NOTE — Telephone Encounter (Signed)
Paperwork revised Paperwork faxed  Pt willcome by to pick up paperwork and send with her  Paperwork  Copy for pt Copy for file Copy for scann

## 2016-03-29 NOTE — Telephone Encounter (Signed)
Called pt again, discussed results again.

## 2016-04-11 ENCOUNTER — Telehealth: Payer: Self-pay | Admitting: Family Medicine

## 2016-04-11 NOTE — Telephone Encounter (Signed)
Pt dropped off short term disability paperwork She stated the physical therapist has changed the return to work date to 04/27/16.  She stated the therapist would be faxing her notes to you.  Paperwork in dr aron's IN BOX To review and sign

## 2016-04-18 NOTE — Telephone Encounter (Signed)
Paperwork faxed 8/31 Pt aware Copy for file  Copy for scan Copy for pt

## 2016-04-23 ENCOUNTER — Other Ambulatory Visit: Payer: Self-pay | Admitting: Obstetrics & Gynecology

## 2016-04-23 DIAGNOSIS — R928 Other abnormal and inconclusive findings on diagnostic imaging of breast: Secondary | ICD-10-CM

## 2016-04-25 ENCOUNTER — Telehealth: Payer: Self-pay | Admitting: Family Medicine

## 2016-04-25 NOTE — Telephone Encounter (Signed)
Spoke to [place] and advised per Dr Deborra Medina

## 2016-04-25 NOTE — Telephone Encounter (Signed)
Yes ok to release back to work from my standpoint.

## 2016-04-25 NOTE — Telephone Encounter (Signed)
Linda [name] with [employer] left v/m; request confirmation that pt is released to return to work full time regular duty on 04/27/16. Linda [name] request cb.

## 2016-04-25 NOTE — Telephone Encounter (Signed)
Pt called stating the physical therapy and hand specialist wants her to be out of work till 05/12/16 returning to work on 05/13/16.  Date changed on paperwork please initial and date marked area  If you have any questions there is a letter from PT stating this  In Dr Hulen Shouts IN BOX

## 2016-05-01 ENCOUNTER — Telehealth: Payer: Self-pay | Admitting: Family Medicine

## 2016-05-01 NOTE — Telephone Encounter (Signed)
Left message for pt to cal back - needs cpe and labs before 09/30

## 2016-05-04 NOTE — Telephone Encounter (Signed)
Made appointment for 9/26 for dm follow up and discuss PT

## 2016-05-08 ENCOUNTER — Encounter: Payer: Self-pay | Admitting: Family Medicine

## 2016-05-08 ENCOUNTER — Ambulatory Visit (INDEPENDENT_AMBULATORY_CARE_PROVIDER_SITE_OTHER): Payer: BC Managed Care – PPO | Admitting: Family Medicine

## 2016-05-08 ENCOUNTER — Telehealth: Payer: Self-pay | Admitting: Family Medicine

## 2016-05-08 VITALS — BP 122/80 | HR 77 | Temp 98.5°F | Wt 192.2 lb

## 2016-05-08 DIAGNOSIS — E119 Type 2 diabetes mellitus without complications: Secondary | ICD-10-CM | POA: Diagnosis not present

## 2016-05-08 DIAGNOSIS — H8110 Benign paroxysmal vertigo, unspecified ear: Secondary | ICD-10-CM | POA: Insufficient documentation

## 2016-05-08 LAB — LIPID PANEL
CHOL/HDL RATIO: 3
CHOLESTEROL: 206 mg/dL — AB (ref 0–200)
HDL: 69.8 mg/dL (ref >39.00)
LDL Cholesterol: 121 mg/dL — ABNORMAL HIGH (ref 0–99)
NonHDL: 136.19
TRIGLYCERIDES: 78 mg/dL (ref 0.0–149.0)
VLDL: 15.6 mg/dL (ref 0.0–40.0)

## 2016-05-08 LAB — HEMOGLOBIN A1C: Hgb A1c MFr Bld: 7 % — ABNORMAL HIGH (ref 4.6–6.5)

## 2016-05-08 LAB — MICROALBUMIN / CREATININE URINE RATIO
CREATININE, U: 201.9 mg/dL
MICROALB UR: 0.9 mg/dL (ref 0.0–1.9)
MICROALB/CREAT RATIO: 0.4 mg/g (ref 0.0–30.0)

## 2016-05-08 NOTE — Progress Notes (Signed)
Pre visit review using our clinic review tool, if applicable. No additional management support is needed unless otherwise documented below in the visit note. 

## 2016-05-08 NOTE — Telephone Encounter (Signed)
Pt dropped of short term disability paperwork In Dr Hulen Shouts IN BOX For review and signature

## 2016-05-08 NOTE — Assessment & Plan Note (Signed)
Due for labs, urine micro today. Lipid panel today as well. No changes made to rxs.
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
## 2016-05-09 ENCOUNTER — Encounter: Payer: Self-pay | Admitting: *Deleted

## 2016-05-09 NOTE — Telephone Encounter (Signed)
Paperwork faxed °Pt aware °Copy for file  °Copy for scan °Copy for pt °

## 2016-05-16 ENCOUNTER — Other Ambulatory Visit: Payer: BC Managed Care – PPO

## 2016-05-18 ENCOUNTER — Telehealth: Payer: Self-pay | Admitting: Family Medicine

## 2016-05-18 DIAGNOSIS — Z7689 Persons encountering health services in other specified circumstances: Secondary | ICD-10-CM

## 2016-05-18 NOTE — Telephone Encounter (Signed)
Pt called stating becky @ her benefits office told her sept form has not been sent in.    I called becky to see what form she needs She stated it was Pt short term disability form Section C  Needed to say sept 2017.  I looked through all my paperwork for pt. Only have aug and oct  No sept  I asked becky if she could fax me paperwork She stated pt would have to get it from her and fill out her part  I called pt to let her know she would need to get paperwork from becky. Pt will bring form sept today Pt will let know what date to put on form  Also you will bring oct form end of oct to be completed.

## 2016-05-18 NOTE — Telephone Encounter (Signed)
Pt faxed short term disability paperwork This paperwork is for the month of sept In dr aron's IN BOX for review and signature

## 2016-05-23 ENCOUNTER — Telehealth: Payer: Self-pay | Admitting: Family Medicine

## 2016-05-23 NOTE — Telephone Encounter (Signed)
Pt dropped of letter from [facility] state and ADA form She wanted me to hold the paperwork until she talks to [employer] @ state.  She will call back to let me know what do to with paperwork

## 2016-05-24 NOTE — Telephone Encounter (Signed)
Dr Deborra Medina  I need help with pages 3 and 4

## 2016-05-24 NOTE — Telephone Encounter (Signed)
Pt came back in.  She stated Mariel Kansky wanted her to go ahead and start the paperwork for ADA.  Dr Deborra Medina there is a couple of pages I need your help with filling out. I have them marked  In Dr Deborra Medina IN BOX

## 2016-05-24 NOTE — Telephone Encounter (Signed)
Completed and on my desk.

## 2016-05-28 NOTE — Telephone Encounter (Signed)
Paperwork faxed Left message letting pt know paperwork has been faxed Copy for file Copy for scan Copy for pt

## 2016-05-29 ENCOUNTER — Ambulatory Visit (INDEPENDENT_AMBULATORY_CARE_PROVIDER_SITE_OTHER): Payer: BC Managed Care – PPO | Admitting: Family Medicine

## 2016-05-29 ENCOUNTER — Encounter: Payer: Self-pay | Admitting: Family Medicine

## 2016-05-29 ENCOUNTER — Telehealth: Payer: Self-pay

## 2016-05-29 DIAGNOSIS — H9201 Otalgia, right ear: Secondary | ICD-10-CM

## 2016-05-29 MED ORDER — PREDNISONE 20 MG PO TABS: ORAL_TABLET | ORAL | 0 refills | Status: DC

## 2016-05-29 NOTE — Telephone Encounter (Signed)
Pt called to verify that prednisone 20 mg should be taken 2 tabs po daily x 7 days. Advised pt that was correct per med list. Pt voiced understanding.

## 2016-05-29 NOTE — Progress Notes (Signed)
Pre visit review using our clinic review tool, if applicable. No additional management support is needed unless otherwise documented below in the visit note. 
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
## 2016-05-29 NOTE — Assessment & Plan Note (Signed)
Ceruminosis is noted.  Wax is removed by syringing and manual debridement. Instructions for home care to prevent wax buildup are given.  Will also place on course of prednisone to help with ETD. The patient indicates understanding of these issues and agrees with the plan. Call or return to clinic prn if these symptoms worsen or fail to improve as anticipated.

## 2016-06-12 ENCOUNTER — Ambulatory Visit (INDEPENDENT_AMBULATORY_CARE_PROVIDER_SITE_OTHER): Payer: BC Managed Care – PPO | Admitting: Family Medicine

## 2016-06-12 ENCOUNTER — Encounter: Payer: Self-pay | Admitting: Family Medicine

## 2016-06-12 DIAGNOSIS — Z7689 Persons encountering health services in other specified circumstances: Secondary | ICD-10-CM

## 2016-06-12 DIAGNOSIS — G43109 Migraine with aura, not intractable, without status migrainosus: Secondary | ICD-10-CM | POA: Diagnosis not present

## 2016-06-12 DIAGNOSIS — G43809 Other migraine, not intractable, without status migrainosus: Secondary | ICD-10-CM | POA: Insufficient documentation

## 2016-06-12 DIAGNOSIS — G43909 Migraine, unspecified, not intractable, without status migrainosus: Secondary | ICD-10-CM | POA: Insufficient documentation

## 2016-06-12 MED ORDER — TOPIRAMATE 25 MG PO TABS: 25.0000 mg | ORAL_TABLET | Freq: Every day | ORAL | 1 refills | Status: DC

## 2016-06-12 NOTE — Progress Notes (Signed)
Pre visit review using our clinic review tool, if applicable. No additional management support is needed unless otherwise documented below in the visit note. 
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
## 2016-06-12 NOTE — Assessment & Plan Note (Signed)
>  25 minutes spent in face to face time with patient, >50% spent in counselling or coordination of care, reviewing records and discussing tx options. We agreed to start topamax 25 mg qhs for prevention. eRx sent. She will update me in a few weeks. The patient indicates understanding of these issues and agrees with the plan.

## 2016-06-12 NOTE — Patient Instructions (Signed)
Great to see you. We are starting Topamax 25 mg nightly. Please update me in a few weeks.

## 2016-06-13 ENCOUNTER — Telehealth: Payer: Self-pay | Admitting: Family Medicine

## 2016-06-13 NOTE — Telephone Encounter (Signed)
Please call patient for end date approximation.

## 2016-06-13 NOTE — Telephone Encounter (Signed)
Paperwork faxed °Pt aware °Copy for pt °Copy for scan °Copy for file °Copy for billing °

## 2016-06-13 NOTE — Telephone Encounter (Signed)
Left message asking pt to call office  °

## 2016-06-13 NOTE — Telephone Encounter (Signed)
Short term disability form In dr aron's in box Please put end date Pt has appointment 06/26/16 with you

## 2016-06-14 ENCOUNTER — Encounter: Payer: Self-pay | Admitting: Family Medicine

## 2016-06-26 ENCOUNTER — Encounter: Payer: Self-pay | Admitting: Family Medicine

## 2016-06-26 ENCOUNTER — Ambulatory Visit (INDEPENDENT_AMBULATORY_CARE_PROVIDER_SITE_OTHER): Payer: BC Managed Care – PPO | Admitting: Family Medicine

## 2016-06-26 VITALS — BP 112/62 | HR 87 | Temp 98.1°F | Wt 197.5 lb

## 2016-06-26 DIAGNOSIS — G43109 Migraine with aura, not intractable, without status migrainosus: Secondary | ICD-10-CM

## 2016-06-26 NOTE — Patient Instructions (Signed)
Great to see you. Please STOP taking Topamax.  Do your exercises, keep trying and update me.

## 2016-06-26 NOTE — Progress Notes (Signed)
Pre visit review using our clinic review tool, if applicable. No additional management support is needed unless otherwise documented below in the visit note. 
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
## 2016-06-26 NOTE — Assessment & Plan Note (Signed)
Topamax added to allergy list- advised to STOP taking this. We discussed start elavil instead but she wants a trial without rx to see if symptoms are just improving on their own with diet and exercises. The patient indicates understanding of these issues and agrees with the plan. She will update me in a few weeks.

## 2016-07-03 ENCOUNTER — Telehealth: Payer: Self-pay
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
## 2016-07-04 NOTE — Telephone Encounter (Signed)
Lm on pts vm requesting a call back 

## 2016-07-04 NOTE — Telephone Encounter (Signed)
Would she like to try the topamax again?

## 2016-07-09 NOTE — Telephone Encounter (Signed)
I looked through her chart and I do not see any other migraine preventative rxs I have prescribed.  If she finds the name of it, please let me know.  We could try something called Elavil as a preventative rx.

## 2016-07-09 NOTE — Telephone Encounter (Signed)
Pt returned call [phone]

## 2016-07-09 NOTE — Telephone Encounter (Signed)
Spoke to pt who states that she had an allergic reaction to topamax, and doesn't know if it would be beneficial to restart it. She states there is another medication you previously prescribed but is unaware of the name

## 2016-07-10 MED ORDER — AMITRIPTYLINE HCL 10 MG PO TABS: 10.0000 mg | ORAL_TABLET | Freq: Every day | ORAL | 1 refills | Status: DC

## 2016-07-10 NOTE — Telephone Encounter (Signed)
Spoke to pt and informed her Rx sent to requested pharmacy 

## 2016-07-10 NOTE — Addendum Note (Signed)
Addended by: Lucille Passy on: 07/10/2016 08:16 AM   Modules accepted: Orders

## 2016-07-10 NOTE — Telephone Encounter (Signed)
Thank you for the update.  Let's try elavil again.  eRx sent.

## 2016-07-10 NOTE — Telephone Encounter (Signed)
Spoke to pt who states that Elavil is the medication she is speaking of. She states that she also fell on 11/20 and wanted to advise Dr Deborra Medina as an Jul[facility] Rainier. She also mentioned that since stopping topamax, she has been experiencing numbness in her fingers

## 2016-07-10 NOTE — Telephone Encounter (Signed)
Lm on pts vm requesting a call back 

## 2016-07-12 ENCOUNTER — Telehealth: Payer: Self-pay | Admitting: Family Medicine

## 2016-07-12 NOTE — Telephone Encounter (Signed)
Paperwork dropped off paperwork to be filled out  I have marked with blue arrow questions that I need help with  In Dr Deborra Medina IN BOX

## 2016-07-18 NOTE — Telephone Encounter (Signed)
Paperwork faxed Left message letting pt know paperwork has been faxed Copy for pt Copy for scan Copy for file

## 2016-07-18 NOTE — Telephone Encounter (Signed)
Lm on pts vm and advised per Dr Aron.  

## 2016-07-18 NOTE — Telephone Encounter (Signed)
Pt stated end date 08/12/16 In dr Deborra Medina IN BOX for signature

## 2016-07-18 NOTE — Telephone Encounter (Signed)
If she is feeling better, she does not need appointment to be seen.

## 2016-07-18 NOTE — Telephone Encounter (Signed)
Pt called wanting to know if she needs to come in for follow up or just let you know how rx is working??  Pt has been taking meds.  Pt stated she is getting better. Her left hand in am and toes on right foot are a little numb  Sent to [facility]

## 2016-07-25 ENCOUNTER — Other Ambulatory Visit: Payer: Self-pay | Admitting: Family Medicine

## 2016-07-30 ENCOUNTER — Telehealth: Payer: Self-pay | Admitting: Neurology

## 2016-07-30 NOTE — Telephone Encounter (Signed)
PATIENT needs to talk to someone about hte MRI please call [phone]

## 2016-07-30 NOTE — Telephone Encounter (Signed)
Contacted patient. She is requesting her test MRI results and testing from [facility] be reviewed. She states they told her the vestibular symptoms could be coming from migraines but she is not having the migraine head symptoms. Patient states when she was younger she does remember not being able to go out in the sun because things like this would start a headache. Please advised.

## 2016-08-01 ENCOUNTER — Telehealth: Payer: Self-pay | Admitting: Neurology

## 2016-08-01 NOTE — Telephone Encounter (Signed)
Linda [name] 2060/10/11. Her # [id number]. She had an MRI and was wanting to know if Dr. Delice Lesch could look back over the results to see if what they are saying is going on is  what Dr. Delice Lesch  feels it is. She also was wanting to know if her PCP received the results as well. Thank you

## 2016-08-02 NOTE — Telephone Encounter (Signed)
See other phone note

## 2016-08-02 NOTE — Telephone Encounter (Signed)
Pls let her know that I reviewed the records, and the tests the ENT specialist did were all normal. People can have different symptoms with their migraines, sometimes even without headaches. I would agree with taking the medication for migraine prevention to see if this helps with her symptoms. Thanks

## 2016-08-02 NOTE — Telephone Encounter (Signed)
Patient called again this morning. Wants to know if Dr. Delice Lesch agrees with her diagnosis. Please advise.

## 2016-08-02 NOTE — Telephone Encounter (Signed)
Patient notified. States she had not made her follow-up. Transferred her to front desk to schedule follow-up.

## 2016-08-16 ENCOUNTER — Ambulatory Visit (INDEPENDENT_AMBULATORY_CARE_PROVIDER_SITE_OTHER): Payer: BC Managed Care – PPO | Admitting: Family Medicine

## 2016-08-16 ENCOUNTER — Encounter: Payer: Self-pay | Admitting: Family Medicine

## 2016-08-16 ENCOUNTER — Telehealth: Payer: Self-pay | Admitting: Family Medicine

## 2016-08-16 VITALS — BP 128/84 | HR 95 | Wt 201.0 lb

## 2016-08-16 DIAGNOSIS — G43109 Migraine with aura, not intractable, without status migrainosus: Secondary | ICD-10-CM | POA: Diagnosis not present

## 2016-08-16 NOTE — Patient Instructions (Signed)
Great to see you. Happy New year.  Please get a massage and try heat on your neck.

## 2016-08-16 NOTE — Telephone Encounter (Signed)
Pt called 08/15/16 evening wanting to know what we wrote on her request for eligibility review form [id number].  She wanted to know if her disability was for driving.  She stated she received a letter this week stating this was denied.  She stated she was terminated from her job and would talk to you about this today at her appointment.
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
## 2016-08-16 NOTE — Assessment & Plan Note (Signed)
With vestibular symptoms. >15 minutes spent in face to face time with patient, >50% spent in counselling or coordination of care Continue elavil.  Keep appt with neuro.

## 2016-08-17 ENCOUNTER — Telehealth: Payer: Self-pay | Admitting: Family Medicine

## 2016-08-17 NOTE — Telephone Encounter (Signed)
Pt dropped off paperwork to be signed In dr Deborra Medina

## 2016-08-20 NOTE — Telephone Encounter (Signed)
Paperwork faxed °Pt aware °Copy for pt °Copy for file °Copy for scan °

## 2016-09-04 ENCOUNTER — Other Ambulatory Visit: Payer: Self-pay | Admitting: Family Medicine

## 2016-09-14 ENCOUNTER — Telehealth: Payer: Self-pay | Admitting: Family Medicine

## 2016-09-14 NOTE — Telephone Encounter (Signed)
Pt faxed short term disability forms to be filled out In dr aron's in box

## 2016-09-18 NOTE — Telephone Encounter (Signed)
Left message letting pt know paperwork has been faxed  °Copy for file °Copy for scan °Copy for pt °

## 2016-09-26 ENCOUNTER — Ambulatory Visit (INDEPENDENT_AMBULATORY_CARE_PROVIDER_SITE_OTHER): Payer: BC Managed Care – PPO | Admitting: Neurology

## 2016-09-26 ENCOUNTER — Encounter: Payer: Self-pay | Admitting: Neurology

## 2016-09-26 VITALS — BP 122/68 | HR 87 | Ht 60.0 in | Wt 204.2 lb

## 2016-09-26 DIAGNOSIS — G43109 Migraine with aura, not intractable, without status migrainosus: Secondary | ICD-10-CM | POA: Diagnosis not present

## 2016-09-26 DIAGNOSIS — R42 Dizziness and giddiness: Secondary | ICD-10-CM | POA: Diagnosis not present

## 2016-09-26 MED ORDER — MECLIZINE HCL 25 MG PO TABS: 25.0000 mg | ORAL_TABLET | Freq: Three times a day (TID) | ORAL | 5 refills | Status: DC | PRN

## 2016-09-26 MED ORDER — AMITRIPTYLINE HCL 10 MG PO TABS: ORAL_TABLET | ORAL | 11 refills | Status: DC

## 2016-09-26 NOTE — Patient Instructions (Signed)
1. Increase amitriptyline 10mg : Take 2 tablets at night 2. Start doing vestibular exercises 3. Follow-up in 4 months

## 2016-09-26 NOTE — Progress Notes (Signed)
NEUROLOGY FOLLOW UP OFFICE NOTE  Linda [name] [id number]  HISTORY OF PRESENT ILLNESS: I had the pleasure of seeing Linda [name] in follow-up in the neurology clinic on 02/14.2018.  The patient was last seen 6 months ago for dizziness. Symptoms are described as a "floating" sensation when she drives, with associated nausea. I personally reviewed MRI brain with and without contrast which was normal. She saw Dr. Gerlene Fee with [facility] with testing showing normal audiogram and vestibular testing, no evidence of peripheral or central vestibular pathology. Symptoms felt to be likely migraine-associated dizziness. She was initially started on Topamax for migraine prophylaxis but had side effects and had a fall. She was then started on amitriptyline for migraine prophylaxis 2 months ago and feels that the medicine is helping some, she is able to drive better now and still feels something coming on when driving on the interstate, but it does not progress. Shaking her head seems to help. Taking meclizine helps a lot, she takes this on average once a week. Initially she denied having any headaches, but since starting the amitriptyline, she is now having mild 1/10 bitemporal throbbing headaches every other day lasting a few hours, then subsiding without any intervention. No associated nausea/vomiting. She denies any further falls since starting the amitriptyline. She reports having really bad headaches when she was a child until her 57s, usually triggered by onions and chocolates. She has occasional left hand numbness when asleep. She is happy to report she has a new job with a shorter drive that does not require interstate driving.  HPI 03/16/16: This is a pleasant 56 yo RH woman with a history of diabetes, GERD, with dizziness. The symptoms started on 01/06/16 while she was driving home when she started feeling as if she would pass out, dizzy with a spinning sensation. She pulled over and called her husband, but  was able to eventually drive home. She noticed she would veer to the right when walking. She saw her PCP and was noted to have nystagmus with Dix-Hallpike testing and impacted cerumen on the left. She returned with ear fullness on the right and again had cerumen disimpaction. She continued to have the dizziness, improved with meclizine, but this caused drowsiness. She underwent vestibular therapy with significant improvement in the dizziness. The spinning sensation has resolved, however she continues to have a residual "floating" sensation particularly noticeable when she is driving. This would be associated with nausea, no vomiting. If she is driving and staying focused forward, she starts feeling "not normal," her ears will pop, "almost like a pressure," then this would be relieved when she looks off to the side. She constantly feels that there is a glare in her vision, more at night, when things do not seem clear. She had an eye exam which was normal. She has seen ENT and has been told she may have had a viral illness, given a steroid injection last June. She reports a history of herpes simplex from many years ago, when stressed out she feels a "nerve pain" on her arms without any visible lesions. She denies any associated headaches, focal numbness/tingling/weakness. She reports that 3 months prior to the onset of these symptoms, she noticed she would feel lightheaded in the morning while driving. No diplopia, dysarthria/dysphagia, bowel/bladder dysfunction. She has some left-sided neck pain that improved with PT. She denies any head injuries or recent infections. She had been taking Phentermine for weight loss, but stopped it in May when she started having the  neck pain. She denies any daytime drowsiness, sleep is good.  PAST MEDICAL HISTORY: Past Medical History:  Diagnosis Date  . Allergic rhinitis   . Allergy    SEASONAL  . Anemia   . Cardiac murmur    1/6 SEM at [facility] (old finding per patient)  .  Diabetes mellitus without complication ([place])   . Obesity     MEDICATIONS: Current Outpatient Prescriptions on File Prior to Visit  Medication Sig Dispense Refill  . amitriptyline (ELAVIL) 10 MG tablet TAKE 1 TABLET BY MOUTH AT BEDTIME 30 tablet 1  . Blood Glucose Monitoring Suppl (ONE TOUCH ULTRA SYSTEM KIT) W/DEVICE KIT 1 kit by Does not apply route once. 1 each 0  . glucose blood test strip One Touch Ultra 100 each 3  . meclizine (ANTIVERT) 25 MG tablet Take 25 mg by mouth 3 (three) times daily as needed for dizziness.    . metFORMIN (GLUCOPHAGE) 500 MG tablet TAKE 1 TABLET (500 MG TOTAL) BY MOUTH 2 (TWO) TIMES DAILY WITH A MEAL. 180 tablet 0  . Misc Natural Products (BLACK COHOSH MENOPAUSE COMPLEX PO) Take by mouth.     No current facility-administered medications on file prior to visit.     ALLERGIES: Allergies  Allergen Reactions  . Morphine Itching    Hydrocodone is OK  . Topamax [Topiramate] Rash    FAMILY HISTORY: Family History  Problem Relation Age of Onset  . Hypertension Mother   . Diabetes Maternal Aunt   . Heart disease Maternal Grandmother   . Heart disease Paternal Grandmother   . Kidney disease Maternal Aunt   . Colon cancer Neg Hx   . Stomach cancer Neg Hx   . Pancreatic cancer Neg Hx   . Esophageal cancer Neg Hx   . Liver disease Neg Hx     SOCIAL HISTORY: Social History   Social History  . Marital status: Married    Spouse name: N/A  . Number of children: 0  . Years of education: Masters   Occupational History  . [employer] / [employer]   Social History Main Topics  . Smoking status: Never Smoker  . Smokeless tobacco: Never Used  . Alcohol use 0.0 oz/week     Comment: monthly  . Drug use: No  . Sexual activity: Not on file   Other Topics Concern  . Not on file   Social History Narrative   Drinks coffee about once a month    [place], [place] 2013   Lives with husband in a one story home.  Has one stepson.  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    REVIEW OF SYSTEMS: Constitutional: No fevers, chills, or sweats, no generalized fatigue, change in appetite Eyes: No visual changes, double vision, eye pain Ear, nose and throat: No hearing loss, ear pain, nasal congestion, sore throat Cardiovascular: No chest pain, palpitations Respiratory:  No shortness of breath at rest or with exertion, wheezes GastrointestinaI: No nausea, vomiting, diarrhea, abdominal pain, fecal incontinence Genitourinary:  No dysuria, urinary retention or frequency Musculoskeletal:  No neck pain, back pain Integumentary: No rash, pruritus, skin lesions Neurological: as above Psychiatric: No depression, insomnia, anxiety Endocrine: No palpitations, fatigue, diaphoresis, mood swings, change in appetite, change in weight, increased thirst Hematologic/Lymphatic:  No anemia, purpura, petechiae. Allergic/Immunologic: no itchy/runny eyes, nasal congestion, recent allergic reactions, rashes  PHYSICAL EXAM: Vitals:   09/26/16 1303  BP: 122/68  Pulse: 87   General: No acute distress Head:  Normocephalic/atraumatic  Neck: supple, no paraspinal tenderness, full range of motion Heart:  Regular rate and rhythm Lungs:  Clear to auscultation bilaterally Back: No paraspinal tenderness Skin/Extremities: No rash, no edema Neurological Exam: alert and oriented to person, place, and time. No aphasia or dysarthria. Fund of knowledge is appropriate.  Recent and remote memory are intact.  Attention and concentration are normal.    Able to name objects and repeat phrases. Cranial nerves: Pupils equal, round, reactive to light.  Fundoscopic exam unremarkable, no papilledema. Extraocular movements intact with no nystagmus. Visual fields full. Facial sensation intact. No facial asymmetry. Tongue, uvula, palate midline.  Motor: Bulk and tone normal, muscle strength 5/5 throughout with no pronator drift.  Sensation to light touch intact.  No extinction to double  simultaneous stimulation.  Deep tendon reflexes 2+ throughout, toes downgoing.  Finger to nose testing intact.  Gait narrow-based and steady, able to tandem walk adequately.  Romberg negative.  IMPRESSION: This is a pleasant 56 yo RH woman with a history of diabetes, GERD with new onset dizziness that started 01/06/16. The dizziness was initially vertiginous with spinning sensation, that resolved with vestibular therapy, however she continued to have a residual "floating" sensation when she drives, with associated nausea. Her neurological exam is non-focal. MRI brain normal. She underwent vestibular testing and an audiogram at [facility] with normal findings, no evidence of peripheral or central vestibular dysfunction, and symptoms were felt to be due to migraine-associated dizziness. She is now on amitriptyline with some effect, and will increase dose to 70m qhs. The episodes continue to be triggered by driving, she was still advised to continue home vestibular/gaze stabilization exercises. She will follow-up in 4 months and knows to call for any changes.   Thank you for allowing me to participate in her care.  Please do not hesitate to call for any questions or concerns.  The duration of this appointment visit was 25 minutes of face-to-face time with the patient.  Greater than 50% of this time was spent in counseling, explanation of diagnosis, planning of further management, and coordination of care.   KEllouise Newer M.D.   CC: Dr. ADeborra Medina

## 2016-09-28 ENCOUNTER — Telehealth: Payer: Self-pay

## 2016-09-28 NOTE — Telephone Encounter (Signed)
Spoke to pt and advised per Dr Aron.  

## 2016-09-28 NOTE — Telephone Encounter (Signed)
Yes ok for her to receive flu shot

## 2016-09-28 NOTE — Telephone Encounter (Signed)
Pt left v/m; pt wanting to get flu shot but wants to know if OK to take flu shot if taking amitriptyline for migraines. Pt request cb.

## 2016-10-02 ENCOUNTER — Ambulatory Visit: Payer: BC Managed Care – PPO | Admitting: Neurology

## 2016-10-20 ENCOUNTER — Other Ambulatory Visit: Payer: Self-pay | Admitting: Family Medicine

## 2016-12-11 ENCOUNTER — Other Ambulatory Visit: Payer: Self-pay | Admitting: Neurology

## 2016-12-11 DIAGNOSIS — G43109 Migraine with aura, not intractable, without status migrainosus: Secondary | ICD-10-CM

## 2016-12-11 MED ORDER — AMITRIPTYLINE HCL 10 MG PO TABS: ORAL_TABLET | ORAL | 3 refills | Status: DC

## 2016-12-13 ENCOUNTER — Other Ambulatory Visit: Payer: Self-pay | Admitting: Neurology

## 2016-12-19 ENCOUNTER — Other Ambulatory Visit: Payer: Self-pay

## 2016-12-19 MED ORDER — AMITRIPTYLINE HCL 10 MG PO TABS: ORAL_TABLET | ORAL | 3 refills | Status: DC

## 2017-01-01 ENCOUNTER — Other Ambulatory Visit: Payer: Self-pay | Admitting: Obstetrics & Gynecology

## 2017-01-01 DIAGNOSIS — N6320 Unspecified lump in the left breast, unspecified quadrant: Secondary | ICD-10-CM

## 2017-01-03 ENCOUNTER — Ambulatory Visit
Admission: RE | Admit: 2017-01-03 | Discharge: 2017-01-03 | Disposition: A | Discharge status: Home / Self Care | Payer: 59 | Source: Ambulatory Visit | Attending: Obstetrics & Gynecology | Admitting: Obstetrics & Gynecology

## 2017-01-03 DIAGNOSIS — N6320 Unspecified lump in the left breast, unspecified quadrant: Secondary | ICD-10-CM

## 2017-01-17 LAB — HM DIABETES EYE EXAM

## 2017-01-25 ENCOUNTER — Ambulatory Visit (INDEPENDENT_AMBULATORY_CARE_PROVIDER_SITE_OTHER): Payer: 59 | Admitting: Neurology

## 2017-01-25 ENCOUNTER — Encounter: Payer: Self-pay | Admitting: Neurology

## 2017-01-25 VITALS — BP 140/82 | HR 82 | Ht 60.0 in | Wt 207.0 lb

## 2017-01-25 DIAGNOSIS — G43109 Migraine with aura, not intractable, without status migrainosus: Secondary | ICD-10-CM | POA: Diagnosis not present

## 2017-01-25 MED ORDER — AMITRIPTYLINE HCL 10 MG PO TABS: ORAL_TABLET | ORAL | 3 refills | Status: DC

## 2017-01-25 NOTE — Progress Notes (Signed)
NEUROLOGY FOLLOW UP OFFICE NOTE  Linda [name] [id number]  HISTORY OF PRESENT ILLNESS: I had the pleasure of seeing Abbe Bula in follow-up in the neurology clinic on 01/25/2017.  The patient was last seen 4 months ago for dizziness. Symptoms are described as a "floating" sensation when she drives, with associated nausea. I personally reviewed MRI brain with and without contrast which was normal. She saw Dr. Gerlene Fee with [facility] with testing showing normal audiogram and vestibular testing, no evidence of peripheral or central vestibular pathology. Symptoms felt to be likely migraine-associated dizziness. She was initially started on Topamax for migraine prophylaxis but had side effects and had a fall. She had some response to amitriptyline and dose was increased to 91m qhs on last visit. Since then, she reports doing much better. She has been able to drive out of town for 5 hours without any symptoms. She mainly has issues driving at night with light sensitivity to bright lights on her rear-view mirror. She mainly tries to change her position to avoid the light reflection. When she tried holding off on taking amitriptyline, she noticed slight brief headaches. When she takes it regularly, she has no headaches. She had a normal eye exam last week. She denies any diplopia, focal numbness/tingling/weakness, no falls. She is very stressed at work. She reports weight gain, but has also not been able to exercise.  HPI 03/16/16: This is a pleasant 56yo RH woman with a history of diabetes, GERD, with dizziness. The symptoms started on 01/06/16 while she was driving home when she started feeling as if she would pass out, dizzy with a spinning sensation. She pulled over and called her husband, but was able to eventually drive home. She noticed she would veer to the right when walking. She saw her PCP and was noted to have nystagmus with Dix-Hallpike testing and impacted cerumen on the left. She returned with  ear fullness on the right and again had cerumen disimpaction. She continued to have the dizziness, improved with meclizine, but this caused drowsiness. She underwent vestibular therapy with significant improvement in the dizziness. The spinning sensation has resolved, however she continues to have a residual "floating" sensation particularly noticeable when she is driving. This would be associated with nausea, no vomiting. If she is driving and staying focused forward, she starts feeling "not normal," her ears will pop, "almost like a pressure," then this would be relieved when she looks off to the side. She constantly feels that there is a glare in her vision, more at night, when things do not seem clear. She had an eye exam which was normal. She has seen ENT and has been told she may have had a viral illness, given a steroid injection last June. She reports a history of herpes simplex from many years ago, when stressed out she feels a "nerve pain" on her arms without any visible lesions. She denies any associated headaches, focal numbness/tingling/weakness. She reports that 3 months prior to the onset of these symptoms, she noticed she would feel lightheaded in the morning while driving. No diplopia, dysarthria/dysphagia, bowel/bladder dysfunction. She has some left-sided neck pain that improved with PT. She denies any head injuries or recent infections. She had been taking Phentermine for weight loss, but stopped it in May when she started having the neck pain. She denies any daytime drowsiness, sleep is good.  PAST MEDICAL HISTORY: Past Medical History:  Diagnosis Date  . Allergic rhinitis   . Allergy  SEASONAL  . Anemia   . Cardiac murmur    1/6 SEM at [facility] (old finding per patient)  . Diabetes mellitus without complication ([place])   . Obesity     MEDICATIONS: Current Outpatient Prescriptions on File Prior to Visit  Medication Sig Dispense Refill  . amitriptyline (ELAVIL) 10 MG tablet Take 2  tablets at night 60 tablet 3  . amitriptyline (ELAVIL) 10 MG tablet Take 2 tablets at night 180 tablet 3  . Blood Glucose Monitoring Suppl (ONE TOUCH ULTRA SYSTEM KIT) W/DEVICE KIT 1 kit by Does not apply route once. 1 each 0  . glucose blood test strip One Touch Ultra 100 each 3  . meclizine (ANTIVERT) 25 MG tablet Take 1 tablet (25 mg total) by mouth 3 (three) times daily as needed for dizziness. 30 tablet 5  . metFORMIN (GLUCOPHAGE) 500 MG tablet TAKE 1 TABLET (500 MG TOTAL) BY MOUTH 2 (TWO) TIMES DAILY WITH A MEAL. 180 tablet 0  . Misc Natural Products (BLACK COHOSH MENOPAUSE COMPLEX PO) Take by mouth.    . Omega-3 Fatty Acids (OMEGA-3 FISH OIL PO) Take by mouth.     No current facility-administered medications on file prior to visit.     ALLERGIES: Allergies  Allergen Reactions  . Morphine Itching    Hydrocodone is OK  . Topamax [Topiramate] Rash    FAMILY HISTORY: Family History  Problem Relation Age of Onset  . Hypertension Mother   . Diabetes Maternal Aunt   . Heart disease Maternal Grandmother   . Heart disease Paternal Grandmother   . Kidney disease Maternal Aunt   . Colon cancer Neg Hx   . Stomach cancer Neg Hx   . Pancreatic cancer Neg Hx   . Esophageal cancer Neg Hx   . Liver disease Neg Hx     SOCIAL HISTORY: Social History   Social History  . Marital status: Married    Spouse name: N/A  . Number of children: 0  . Years of education: Masters   Occupational History  . [employer] / [employer]   Social History Main Topics  . Smoking status: Never Smoker  . Smokeless tobacco: Never Used  . Alcohol use 0.0 oz/week     Comment: monthly  . Drug use: No  . Sexual activity: Not on file   Other Topics Concern  . Not on file   Social History Narrative   Drinks coffee about once a month    [place], [place] 2013   Lives with husband in a one story home.  Has one stepson.  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    REVIEW OF  SYSTEMS: Constitutional: No fevers, chills, or sweats, no generalized fatigue, change in appetite Eyes: No visual changes, double vision, eye pain Ear, nose and throat: No hearing loss, ear pain, nasal congestion, sore throat Cardiovascular: No chest pain, palpitations Respiratory:  No shortness of breath at rest or with exertion, wheezes GastrointestinaI: No nausea, vomiting, diarrhea, abdominal pain, fecal incontinence Genitourinary:  No dysuria, urinary retention or frequency Musculoskeletal:  No neck pain, back pain Integumentary: No rash, pruritus, skin lesions Neurological: as above Psychiatric: No depression, insomnia, anxiety Endocrine: No palpitations, fatigue, diaphoresis, mood swings, change in appetite, change in weight, increased thirst Hematologic/Lymphatic:  No anemia, purpura, petechiae. Allergic/Immunologic: no itchy/runny eyes, nasal congestion, recent allergic reactions, rashes  PHYSICAL EXAM: Vitals:   01/25/17 1506  BP: 140/82  Pulse: 82   General: No acute distress Head:  Normocephalic/atraumatic  Neck: supple, no paraspinal tenderness, full range of motion Heart:  Regular rate and rhythm Lungs:  Clear to auscultation bilaterally Back: No paraspinal tenderness Skin/Extremities: No rash, no edema Neurological Exam: alert and oriented to person, place, and time. No aphasia or dysarthria. Fund of knowledge is appropriate.  Recent and remote memory are intact.  Attention and concentration are normal.    Able to name objects and repeat phrases. Cranial nerves: Pupils equal, round, reactive to light.  Fundoscopic exam unremarkable, no papilledema. Extraocular movements intact with no nystagmus. Visual fields full. Facial sensation intact. No facial asymmetry. Tongue, uvula, palate midline.  Motor: Bulk and tone normal, muscle strength 5/5 throughout with no pronator drift.  Sensation to light touch intact.  No extinction to double simultaneous stimulation.  Deep tendon  reflexes 2+ throughout, toes downgoing.  Finger to nose testing intact.  Gait narrow-based and steady, able to tandem walk adequately.  Romberg negative.  IMPRESSION: This is a pleasant 56 yo RH woman with a history of diabetes, GERD with new onset dizziness that started 01/06/16. The dizziness was initially vertiginous with spinning sensation, that resolved with vestibular therapy, however she continued to have a residual "floating" sensation when she drives, with associated nausea. Her neurological exam is non-focal. MRI brain normal. She underwent vestibular testing and an audiogram at [facility] with normal findings, no evidence of peripheral or central vestibular dysfunction, and symptoms were felt to be due to migraine-associated dizziness. She feels much better on amitriptyline 46m qhs except for some light sensitivity when driving at night. She is having some weight gain and would like to see if lower dose amitriptyline 19mqhs will still help her symptoms but not contribute to weight, if symptoms recur, she will go back to 2056mhs. She will restart exercising. She reports a lot of stress at work. She will follow-up in 6 months and knows to call for any changes.   Thank you for allowing me to participate in her care.  Please do not hesitate to call for any questions or concerns.  The duration of this appointment visit was 25 minutes of face-to-face time with the patient.  Greater than 50% of this time was spent in counseling, explanation of diagnosis, planning of further management, and coordination of care.   KarEllouise Newer.D.   CC: Dr. AroDeborra Medina

## 2017-01-25 NOTE — Patient Instructions (Signed)
1. Try reducing amtriptyline 10mg : Take 1 capsule at night. See how you feel over the next 3-4 weeks, and if not doing okay, go back to 2 caps at night 2. Agree with returning to exercise 3. Follow-up in 6 months, call for any changes

## 2017-03-19 ENCOUNTER — Ambulatory Visit: Payer: 59 | Admitting: Family Medicine

## 2017-03-19 ENCOUNTER — Encounter: Payer: Self-pay | Admitting: Family Medicine

## 2017-03-19 ENCOUNTER — Ambulatory Visit (INDEPENDENT_AMBULATORY_CARE_PROVIDER_SITE_OTHER): Payer: 59 | Admitting: Family Medicine

## 2017-03-19 VITALS — BP 122/70 | HR 76 | Temp 98.8°F | Ht 60.0 in | Wt 204.2 lb

## 2017-03-19 DIAGNOSIS — B9789 Other viral agents as the cause of diseases classified elsewhere: Secondary | ICD-10-CM

## 2017-03-19 DIAGNOSIS — J069 Acute upper respiratory infection, unspecified: Secondary | ICD-10-CM

## 2017-03-19 NOTE — Patient Instructions (Addendum)
I think you have a viral upper respiratory infection (ie: cold) with cough  Drink lots of fluids  Get to bed early if you can  mucinex DM for cough and congestion  Antihistamine for drip and runny nose ( zyrtec 10 mg )  Tylenol for aches or low grade fever   All the rest you can get  Update if not starting to improve in a week or if worsening

## 2017-03-19 NOTE — Assessment & Plan Note (Signed)
With st and pnd  Reassuring exam Disc symptomatic care - see instructions on AVS - will try mucinex dm and zyrtec Fluids and steam and rest  Update if not starting to improve in a week or if worsening
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
## 2017-04-01 ENCOUNTER — Other Ambulatory Visit: Payer: Self-pay | Admitting: Family Medicine

## 2017-04-28 ENCOUNTER — Other Ambulatory Visit: Payer: Self-pay | Admitting: Family Medicine

## 2017-07-06 ENCOUNTER — Other Ambulatory Visit: Payer: Self-pay | Admitting: Family Medicine

## 2017-08-01 ENCOUNTER — Other Ambulatory Visit: Payer: 59

## 2017-08-02 ENCOUNTER — Encounter: Payer: Self-pay | Admitting: Neurology

## 2017-08-02 ENCOUNTER — Encounter: Payer: 59 | Admitting: Nurse Practitioner

## 2017-08-02 ENCOUNTER — Encounter: Payer: Self-pay | Admitting: Nurse Practitioner

## 2017-08-02 ENCOUNTER — Ambulatory Visit (INDEPENDENT_AMBULATORY_CARE_PROVIDER_SITE_OTHER): Payer: 59 | Admitting: Nurse Practitioner

## 2017-08-02 ENCOUNTER — Ambulatory Visit (INDEPENDENT_AMBULATORY_CARE_PROVIDER_SITE_OTHER): Payer: 59 | Admitting: Neurology

## 2017-08-02 VITALS — BP 126/90 | HR 89 | Temp 98.0°F | Ht 60.0 in | Wt 210.0 lb

## 2017-08-02 VITALS — BP 132/88 | HR 93 | Ht 60.0 in | Wt 212.0 lb

## 2017-08-02 DIAGNOSIS — E119 Type 2 diabetes mellitus without complications: Secondary | ICD-10-CM

## 2017-08-02 DIAGNOSIS — H5319 Other subjective visual disturbances: Secondary | ICD-10-CM | POA: Diagnosis not present

## 2017-08-02 DIAGNOSIS — G43109 Migraine with aura, not intractable, without status migrainosus: Secondary | ICD-10-CM | POA: Diagnosis not present

## 2017-08-02 LAB — BASIC METABOLIC PANEL
BUN: 16 mg/dL (ref 6–23)
CALCIUM: 9.2 mg/dL (ref 8.4–10.5)
CHLORIDE: 102 meq/L (ref 96–112)
CO2: 28 mEq/L (ref 19–32)
CREATININE: 0.74 mg/dL (ref 0.40–1.20)
GFR: 105.02 mL/min (ref >60.00)
Glucose, Bld: 239 mg/dL — ABNORMAL HIGH (ref 70–99)
Potassium: 5.1 mEq/L (ref 3.5–5.1)
Sodium: 139 mEq/L (ref 135–145)

## 2017-08-02 LAB — MICROALBUMIN / CREATININE URINE RATIO
CREATININE, U: 172.1 mg/dL
MICROALB/CREAT RATIO: 0.7 mg/g (ref 0.0–30.0)
Microalb, Ur: 1.2 mg/dL (ref 0.0–1.9)

## 2017-08-02 LAB — HEMOGLOBIN A1C: HEMOGLOBIN A1C: 9.3 % — AB (ref 4.6–6.5)

## 2017-08-02 MED ORDER — AMITRIPTYLINE HCL 10 MG PO TABS: ORAL_TABLET | ORAL | 3 refills | Status: DC

## 2017-08-02 MED ORDER — METFORMIN HCL 1000 MG PO TABS: 1000.0000 mg | ORAL_TABLET | Freq: Two times a day (BID) | ORAL | 1 refills | Status: DC

## 2017-08-02 NOTE — Patient Instructions (Signed)
1. Continue amitriptyline 10mg  at night 2. Refer to Neuro-ophthalmology at [facility] for second opinion 3. Follow-up in 6 months, call for any changes

## 2017-08-02 NOTE — Progress Notes (Addendum)
Subjective:  Patient ID: Linda [name], female    DOB: 1961-02-01  Age: 56 y.o. MRN: [id number]  CC: Follow-up (a1c check and get metformin refills. )   Diabetes  She presents for her follow-up diabetic visit. She has type 2 diabetes mellitus. No MedicAlert identification noted. Her disease course has been worsening. There are no hypoglycemic associated symptoms. Pertinent negatives for diabetes include no blurred vision, no chest pain, no fatigue, no foot paresthesias, no foot ulcerations, no polydipsia, no polyphagia, no polyuria, no visual change, no weakness and no weight loss. There are no hypoglycemic complications. Symptoms are stable. There are no diabetic complications. Risk factors for coronary artery disease include obesity and sedentary lifestyle. When asked about current treatments, none were reported. She is compliant with treatment some of the time. She has not had a previous visit with a dietitian. She never participates in exercise. An ACE inhibitor/angiotensin II receptor blocker is not being taken. She does not see a podiatrist.Eye exam is not current.   Denies any acute complaint. Does not check glucose at home. Does not follow any particular diet, nor exercise regimen. Last eye done 72month: normal per patient. Done by Dr. MNewman Nip Has been out of metformin x 251monthdue to lack of finance and insurance coverage.  Outpatient Medications Prior to Visit  Medication Sig Dispense Refill  . Blood Glucose Monitoring Suppl (ONE TOUCH ULTRA SYSTEM KIT) W/DEVICE KIT 1 kit by Does not apply route once. 1 each 0  . glucose blood test strip One Touch Ultra 100 each 3  . meclizine (ANTIVERT) 25 MG tablet Take 1 tablet (25 mg total) by mouth 3 (three) times daily as needed for dizziness. 30 tablet 5  . Omega-3 Fatty Acids (OMEGA-3 FISH OIL PO) Take by mouth.    . Marland Kitchenmitriptyline (ELAVIL) 10 MG tablet Take 2 tablets at night (Patient taking differently: 10 mg at bedtime. Take 2  tablets at night) 180 tablet 3  . metFORMIN (GLUCOPHAGE) 500 MG tablet Take 1 tablet (500 mg total) by mouth daily with breakfast. 60 tablet 0  . metFORMIN (GLUCOPHAGE) 500 MG tablet TAKE 1 TABLET (500 MG TOTAL) BY MOUTH 2 (TWO) TIMES DAILY WITH A MEAL. 60 tablet 0   No facility-administered medications prior to visit.     ROS Review of Systems  Constitutional: Negative.  Negative for fatigue and weight loss.  Eyes: Negative for blurred vision.  Respiratory: Negative.   Cardiovascular: Negative.  Negative for chest pain.  Gastrointestinal: Negative for abdominal pain, constipation and diarrhea.  Genitourinary: Negative for frequency.  Musculoskeletal: Negative for myalgias.  Skin: Negative.   Neurological: Negative for weakness.  Endo/Heme/Allergies: Negative for polydipsia and polyphagia.     Objective:  BP 126/90   Pulse 89   Temp 98 F (36.7 C)   Ht 5' (1.524 m)   Wt 210 lb (95.3 kg)   SpO2 96%   BMI 41.01 kg/m   BP Readings from Last 3 Encounters:  08/02/17 132/88  08/02/17 126/90  03/19/17 122/70    Wt Readings from Last 3 Encounters:  08/02/17 212 lb (96.2 kg)  08/02/17 210 lb (95.3 kg)  03/19/17 204 lb 4 oz (92.6 kg)    Physical Exam  Constitutional: She is oriented to person, place, and time. No distress.  Neck: Normal range of motion. Neck supple.  Cardiovascular: Normal rate and regular rhythm.  Pulmonary/Chest: Effort normal and breath sounds normal.  Musculoskeletal: Normal range of motion. She exhibits no edema.  Neurological: She  is alert and oriented to person, place, and time.  Skin: Skin is warm and dry.  Psychiatric: She has a normal mood and affect. Her behavior is normal.  Vitals reviewed.   Lab Results  Component Value Date   WBC 8.4 12/11/2007   HGB 8.5 (L) 12/11/2007   HCT 24.9 (L) 12/11/2007   PLT 222 12/11/2007   GLUCOSE 239 (H) 08/02/2017   CHOL 206 (H) 05/08/2016   TRIG 78.0 05/08/2016   HDL 69.80 05/08/2016   LDLDIRECT  118.9 07/05/2014   LDLCALC 121 (H) 05/08/2016   ALT 20 04/14/2015   AST 22 04/14/2015   NA 139 08/02/2017   K 5.1 08/02/2017   CL 102 08/02/2017   CREATININE 0.74 08/02/2017   BUN 16 08/02/2017   CO2 28 08/02/2017   INR 0.9 12/08/2007   HGBA1C 9.3 (H) 08/02/2017   MICROALBUR 1.2 08/02/2017    Mm Diag Breast Tomo Bilateral  Result Date: 01/03/2017 CLINICAL DATA:  Delayed follow-up of the left breast and annual examination of the right breast. Probably benign mass or asymmetry was noted in the left breast on the MLO view of screening mammogram in March 2017 and diagnostic mammogram of April 2017. No suspicious findings were seen on ultrasound. EXAM: 2D DIGITAL DIAGNOSTIC BILATERAL MAMMOGRAM WITH CAD AND ADJUNCT TOMO COMPARISON:  Previous exam(s). ACR Breast Density Category b: There are scattered areas of fibroglandular density. FINDINGS: The parenchymal pattern of both breasts is stable. Asymmetry adjacent to a vessel in the posterior left breast on the MLO view appears tubular and may be due to a vessel branch. There is no suspicious mass in this region of the left breast, and no change compared to the mammogram of 2017. The remainder of the left breast is negative. There is no architectural distortion or suspicious microcalcification. No suspicious mass, architectural distortion, or suspicious microcalcification is identified in the right breast to suggest malignancy. Mammographic images were processed with CAD. IMPRESSION: No evidence of malignancy in either breast. RECOMMENDATION: Screening mammogram in one year.(Code:SM-B-01Y) I have discussed the findings and recommendations with the patient. Results were also provided in writing at the conclusion of the visit. If applicable, a reminder letter will be sent to the patient regarding the next appointment. BI-RADS CATEGORY  2: Benign. Electronically Signed   By: Curlene Dolphin M.D.   On: 01/03/2017 13:46    Assessment & Plan:   Linda [name] was seen  today for follow-up.  Diagnoses and all orders for this visit:  Type 2 diabetes mellitus without complication, without long-term current use of insulin (HCC) -     Hemoglobin A1c -     Microalbumin / creatinine urine ratio -     Basic metabolic panel -     metFORMIN (GLUCOPHAGE) 1000 MG tablet; Take 1 tablet (1,000 mg total) by mouth 2 (two) times daily with a meal.   I have discontinued Linda [name] metFORMIN. I have also changed her metFORMIN. Additionally, I am having her maintain her [place] KIT, glucose blood, Omega-3 Fatty Acids (OMEGA-3 FISH OIL PO), and meclizine.  Meds ordered this encounter  Medications  . metFORMIN (GLUCOPHAGE) 1000 MG tablet    Sig: Take 1 tablet (1,000 mg total) by mouth 2 (two) times daily with a meal.    Dispense:  180 tablet    Refill:  1    Order Specific Question:   Supervising Provider    Answer:   Lucille Passy [[id number]]   Follow-up: Return in  about 3 months (around 10/31/2017) for DM with Dr. Deborra Medina (fasting).  Wilfred Lacy, NP

## 2017-08-02 NOTE — Progress Notes (Signed)
NEUROLOGY FOLLOW UP OFFICE NOTE  Linda [name] [id number]  HISTORY OF PRESENT ILLNESS: I had Linda pleasure of seeing Linda [name] in follow-up in Linda neurology clinic on 08/02/2017.  Linda [name] was last seen 6 months ago for dizziness, felt to be secondary to migraines. Symptoms are described as a "floating" sensation when she drives, with associated nausea. I personally reviewed MRI brain with and without contrast which was normal. She saw Dr. Gerlene Fee with [facility] with testing showing normal audiogram and vestibular testing, no evidence of peripheral or central vestibular pathology, symptoms felt to be likely migraine-associated dizziness. She had side effects to Topamax. She had a good response to amitriptyline with improvement in dizziness, but was concerned about weight gain. Dose reduced to 36m qhs on her last visit. She reports that she does not have any symptoms during Linda daytime any longer, but continues to have issues driving at night. She describes a halo around headlights that would make her feel a little confused (described as how a new driver is confused). She had to take meclizine one time when she [place] She has seen her eye doctor and told she has a normal exam. She has not noticed any improvement wearing anti-glare glasses. Some nights are worse than other, it is not every night she drives. She denies any headaches with these.   HPI 03/16/16: This is a pleasant 56yo RH woman with a history of diabetes, GERD, with dizziness. Linda symptoms started on 01/06/16 while she was driving home when she started feeling as if she would pass out, dizzy with a spinning sensation. She pulled over and called her husband, but was able to eventually drive home. She noticed she would veer to Linda right when walking. She saw her PCP and was noted to have nystagmus with Dix-Hallpike testing and impacted cerumen on Linda left. She returned with ear fullness  on Linda right and again had cerumen disimpaction. She continued to have Linda dizziness, improved with meclizine, but this caused drowsiness. She underwent vestibular therapy with significant improvement in Linda dizziness. Linda spinning sensation has resolved, however she continues to have a residual "floating" sensation particularly noticeable when she is driving. This would be associated with nausea, no vomiting. If she is driving and staying focused forward, she starts feeling "not normal," her ears will pop, "almost like a pressure," then this would be relieved when she looks off to Linda side. She constantly feels that there is a glare in her vision, more at night, when things do not seem clear. She had an eye exam which was normal. She has seen ENT and has been told she may have had a viral illness, given a steroid injection last June. She reports a history of herpes simplex from many years ago, when stressed out she feels a "nerve pain" on her arms without any visible lesions. She denies any associated headaches, focal numbness/tingling/weakness. She reports that 3 months prior to Linda onset of these symptoms, she noticed she would feel lightheaded in Linda morning while driving. No diplopia, dysarthria/dysphagia, bowel/bladder dysfunction. She has some left-sided neck pain that improved with PT. She denies any head injuries or recent infections. She had been taking Phentermine for weight loss, but stopped it in May when she started having Linda neck pain. She denies any daytime drowsiness, sleep is good.  PAST MEDICAL HISTORY: Past Medical History:  Diagnosis Date  . Allergic rhinitis   .  Allergy    SEASONAL  . Anemia   . Cardiac murmur    1/6 SEM at [facility] (old finding per [name])  . Diabetes mellitus without complication ([place])   . Obesity     MEDICATIONS: Current Outpatient Medications on File Prior to Visit  Medication Sig Dispense Refill  . amitriptyline (ELAVIL) 10 MG tablet Take 2 tablets at night  ([name] taking differently: 10 mg at bedtime. Take 2 tablets at night) 180 tablet 3  . Blood Glucose Monitoring Suppl (ONE TOUCH ULTRA SYSTEM KIT) W/DEVICE KIT 1 kit by Does not apply route once. 1 each 0  . glucose blood test strip One Touch Ultra 100 each 3  . meclizine (ANTIVERT) 25 MG tablet Take 1 tablet (25 mg total) by mouth 3 (three) times daily as needed for dizziness. 30 tablet 5  . metFORMIN (GLUCOPHAGE) 500 MG tablet Take 1 tablet (500 mg total) by mouth daily with breakfast. 60 tablet 0  . metFORMIN (GLUCOPHAGE) 500 MG tablet TAKE 1 TABLET (500 MG TOTAL) BY MOUTH 2 (TWO) TIMES DAILY WITH A MEAL. 60 tablet 0  . Omega-3 Fatty Acids (OMEGA-3 FISH OIL PO) Take by mouth.     No current facility-administered medications on file prior to visit.     ALLERGIES: Allergies  Allergen Reactions  . Morphine Itching    Hydrocodone is OK  . Topamax [Topiramate] Rash    FAMILY HISTORY: Family History  Problem Relation Age of Onset  . Hypertension Mother   . Diabetes Maternal Aunt   . Heart disease Maternal Grandmother   . Heart disease Paternal Grandmother   . Kidney disease Maternal Aunt   . Colon cancer Neg Hx   . Stomach cancer Neg Hx   . Pancreatic cancer Neg Hx   . Esophageal cancer Neg Hx   . Liver disease Neg Hx     SOCIAL HISTORY: Social History   Socioeconomic History  . Marital status: Married    Spouse name: Not on file  . Number of children: 0  . Years of education: Masters  . Highest education level: Not on file  Social Needs  . Financial resource strain: Not on file  . Food insecurity - worry: Not on file  . Food insecurity - inability: Not on file  . Transportation needs - medical: Not on file  . Transportation needs - non-medical: Not on file  Occupational History  . 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 Smoking status: Never Smoker  . Smokeless tobacco: Never Used  Substance and Sexual Activity  . Alcohol  use: Yes    Alcohol/week: 0.0 oz    Comment: monthly  . Drug use: No  . Sexual activity: Not on file  Other Topics Concern  . Not on file  Social History Narrative   Drinks coffee about once a month    [place], [place] 2013   Lives with husband in a one story home.  Has one stepson.  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    REVIEW OF SYSTEMS: Constitutional: No fevers, chills, or sweats, no generalized fatigue, change in appetite Eyes: No visual changes, double vision, eye pain Ear, nose and throat: No hearing loss, ear pain, nasal congestion, sore throat Cardiovascular: No chest pain, palpitations Respiratory:  No shortness of breath at rest or with exertion, wheezes GastrointestinaI: No nausea, vomiting, diarrhea, abdominal pain, fecal incontinence Genitourinary:  No dysuria, urinary retention or frequency Musculoskeletal:  No  neck pain, back pain Integumentary: No rash, pruritus, skin lesions Neurological: as above Psychiatric: No depression, insomnia, anxiety Endocrine: No palpitations, fatigue, diaphoresis, mood swings, change in appetite, change in weight, increased thirst Hematologic/Lymphatic:  No anemia, purpura, petechiae. Allergic/Immunologic: no itchy/runny eyes, nasal congestion, recent allergic reactions, rashes  PHYSICAL EXAM: Vitals:   08/02/17 1540  BP: 132/88  Pulse: 93  SpO2: 98%   General: No acute distress Head:  Normocephalic/atraumatic Neck: supple, no paraspinal tenderness, full range of motion Heart:  Regular rate and rhythm Lungs:  Clear to auscultation bilaterally Back: No paraspinal tenderness Skin/Extremities: No rash, no edema Neurological Exam: alert and oriented to person, place, and time. No aphasia or dysarthria. Fund of knowledge is appropriate.  Recent and remote memory are intact.  Attention and concentration are normal.    Able to name objects and repeat phrases. Cranial nerves: Pupils equal, round, reactive to light.  Fundoscopic exam  unremarkable, no papilledema. Extraocular movements intact with no nystagmus. Visual fields full. Facial sensation intact. No facial asymmetry. Tongue, uvula, palate midline.  Motor: Bulk and tone normal, muscle strength 5/5 throughout with no pronator drift.  Sensation to light touch intact.  No extinction to double simultaneous stimulation.  Deep tendon reflexes 2+ throughout, toes downgoing.  Finger to nose testing intact.  Gait narrow-based and steady, able to tandem walk adequately.  Romberg negative.  IMPRESSION: This is a pleasant 56 yo RH woman with a history of diabetes, GERD with new onset dizziness that started 01/06/16. Linda dizziness was initially vertiginous with spinning sensation, that resolved with vestibular therapy, however she continued to have a residual "floating" sensation when she drives, with associated nausea. Her neurological exam is non-focal. MRI brain normal. She underwent vestibular testing and an audiogram at [facility] with normal findings, no evidence of peripheral or central vestibular dysfunction, and symptoms were felt to be due to migraine-associated dizziness. She had improvement in symptoms with amitriptyline, currently on low dose 66m qhs with no further difficulties during Linda daytime, but at night continues to report haloes around headlights causing her to feel dizzy. She would like a second opinion, referral will be [facility], we discussed avoidance of triggers (driving at night). She will follow-up in 6 months and knows to call for any changes.   Thank you for allowing me to participate in her care.  Please do not hesitate to call for any questions or concerns.  Linda duration of this appointment visit was 25 minutes of face-to-face time with Linda [name].  Greater than 50% of this time was spent in counseling, explanation of diagnosis, planning of further management, and coordination of care.   KEllouise Newer  M.D.   CC: Dr. ADeborra Medina

## 2017-08-02 NOTE — Patient Instructions (Addendum)
Normal urine microalbumin. Normal renal function except elevated glucose due to uncontrolled DM. hgb A1c indicates worsening DM. Need to resume metformin. Dose increased to 1000mg  BID. F/up with pcp in 57months.  Diabetes Mellitus and Nutrition When you have diabetes (diabetes mellitus), it is very important to have healthy eating habits because your blood sugar (glucose) levels are greatly affected by what you eat and drink. Eating healthy foods in the appropriate amounts, at about the same times every day, can help you:  Control your blood glucose.  Lower your risk of heart disease.  Improve your blood pressure.  Reach or maintain a healthy weight.  Every person with diabetes is different, and each person has different needs for a meal plan. Your health care provider may recommend that you work with a diet and nutrition specialist (dietitian) to make a meal plan that is best for you. Your meal plan may vary depending on factors such as:  The calories you need.  The medicines you take.  Your weight.  Your blood glucose, blood pressure, and cholesterol levels.  Your activity level.  Other health conditions you have, such as heart or kidney disease.  How do carbohydrates affect me? Carbohydrates affect your blood glucose level more than any other type of food. Eating carbohydrates naturally increases the amount of glucose in your blood. Carbohydrate counting is a method for keeping track of how many carbohydrates you eat. Counting carbohydrates is important to keep your blood glucose at a healthy level, especially if you use insulin or take certain oral diabetes medicines. It is important to know how many carbohydrates you can safely have in each meal. This is different for every person. Your dietitian can help you calculate how many carbohydrates you should have at each meal and for snack. Foods that contain carbohydrates include:  Bread, cereal, rice, pasta, and  crackers.  Potatoes and corn.  Peas, beans, and lentils.  Milk and yogurt.  Fruit and juice.  Desserts, such as cakes, cookies, ice cream, and candy.  How does alcohol affect me? Alcohol can cause a sudden decrease in blood glucose (hypoglycemia), especially if you use insulin or take certain oral diabetes medicines. Hypoglycemia can be a life-threatening condition. Symptoms of hypoglycemia (sleepiness, dizziness, and confusion) are similar to symptoms of having too much alcohol. If your health care provider says that alcohol is safe for you, follow these guidelines:  Limit alcohol intake to no more than 1 drink per day for nonpregnant women and 2 drinks per day for men. One drink equals 12 oz of beer, 5 oz of wine, or 1 oz of hard liquor.  Do not drink on an empty stomach.  Keep yourself hydrated with water, diet soda, or unsweetened iced tea.  Keep in mind that regular soda, juice, and other mixers may contain a lot of sugar and must be counted as carbohydrates.  What are tips for following this plan? Reading food labels  Start by checking the serving size on the label. The amount of calories, carbohydrates, fats, and other nutrients listed on the label are based on one serving of the food. Many foods contain more than one serving per package.  Check the total grams (g) of carbohydrates in one serving. You can calculate the number of servings of carbohydrates in one serving by dividing the total carbohydrates by 15. For example, if a food has 30 g of total carbohydrates, it would be equal to 2 servings of carbohydrates.  Check the number of grams (g)  of saturated and trans fats in one serving. Choose foods that have low or no amount of these fats.  Check the number of milligrams (mg) of sodium in one serving. Most people should limit total sodium intake to less than 2,300 mg per day.  Always check the nutrition information of foods labeled as "low-fat" or "nonfat". These foods  may be higher in added sugar or refined carbohydrates and should be avoided.  Talk to your dietitian to identify your daily goals for nutrients listed on the label. Shopping  Avoid buying canned, premade, or processed foods. These foods tend to be high in fat, sodium, and added sugar.  Shop around the outside edge of the grocery store. This includes fresh fruits and vegetables, bulk grains, fresh meats, and fresh dairy. Cooking  Use low-heat cooking methods, such as baking, instead of high-heat cooking methods like deep frying.  Cook using healthy oils, such as olive, canola, or sunflower oil.  Avoid cooking with butter, cream, or high-fat meats. Meal planning  Eat meals and snacks regularly, preferably at the same times every day. Avoid going long periods of time without eating.  Eat foods high in fiber, such as fresh fruits, vegetables, beans, and whole grains. Talk to your dietitian about how many servings of carbohydrates you can eat at each meal.  Eat 4-6 ounces of lean protein each day, such as lean meat, chicken, fish, eggs, or tofu. 1 ounce is equal to 1 ounce of meat, chicken, or fish, 1 egg, or 1/4 cup of tofu.  Eat some foods each day that contain healthy fats, such as avocado, nuts, seeds, and fish. Lifestyle   Check your blood glucose regularly.  Exercise at least 30 minutes 5 or more days each week, or as told by your health care provider.  Take medicines as told by your health care provider.  Do not use any products that contain nicotine or tobacco, such as cigarettes and e-cigarettes. If you need help quitting, ask your health care provider.  Work with a Social worker or diabetes educator to identify strategies to manage stress and any emotional and social challenges. What are some questions to ask my health care provider?  Do I need to meet with a diabetes educator?  Do I need to meet with a dietitian?  What number can I call if I have questions?  When are the  best times to check my blood glucose? Where to find more information:  American Diabetes Association: diabetes.org/food-and-fitness/food  Academy of Nutrition and Dietetics: PokerClues.dk  [employer] of Diabetes and Digestive and Kidney Diseases (NIH): ContactWire.be Summary  A healthy meal plan will help you control your blood glucose and maintain a healthy lifestyle.  Working with a diet and nutrition specialist (dietitian) can help you make a meal plan that is best for you.  Keep in mind that carbohydrates and alcohol have immediate effects on your blood glucose levels. It is important to count carbohydrates and to use alcohol carefully. This information is not intended to replace advice given to you by your health care provider. Make sure you discuss any questions you have with your health care provider. Document Released: 04/26/2005 Document Revised: 09/03/2016 Document Reviewed: 09/03/2016 Elsevier Interactive Patient Education  2018 [employer].   Diabetes Mellitus and Exercise Exercising regularly is important for your overall health, especially when you have diabetes (diabetes mellitus). Exercising is not only about losing weight. It has many health benefits, such as increasing muscle strength and bone density and reducing body  fat and stress. This leads to improved fitness, flexibility, and endurance, all of which result in better overall health. Exercise has additional benefits for people with diabetes, including:  Reducing appetite.  Helping to lower and control blood glucose.  Lowering blood pressure.  Helping to control amounts of fatty substances (lipids) in the blood, such as cholesterol and triglycerides.  Helping the body to respond better to insulin (improving insulin sensitivity).  Reducing how much insulin the body  needs.  Decreasing the risk for heart disease by: ? Lowering cholesterol and triglyceride levels. ? Increasing the levels of good cholesterol. ? Lowering blood glucose levels.  What is my activity plan? Your health care provider or certified diabetes educator can help you make a plan for the type and frequency of exercise (activity plan) that works for you. Make sure that you:  Do at least 150 minutes of moderate-intensity or vigorous-intensity exercise each week. This could be brisk walking, biking, or water aerobics. ? Do stretching and strength exercises, such as yoga or weightlifting, at least 2 times a week. ? Spread out your activity over at least 3 days of the week.  Get some form of physical activity every day. ? Do not go more than 2 days in a row without some kind of physical activity. ? Avoid being inactive for more than 90 minutes at a time. Take frequent breaks to walk or stretch.  Choose a type of exercise or activity that you enjoy, and set realistic goals.  Start slowly, and gradually increase the intensity of your exercise over time.  What do I need to know about managing my diabetes?  Check your blood glucose before and after exercising. ? If your blood glucose is higher than 240 mg/dL (13.3 mmol/L) before you exercise, check your urine for ketones. If you have ketones in your urine, do not exercise until your blood glucose returns to normal.  Know the symptoms of low blood glucose (hypoglycemia) and how to treat it. Your risk for hypoglycemia increases during and after exercise. Common symptoms of hypoglycemia can include: ? Hunger. ? Anxiety. ? Sweating and feeling clammy. ? Confusion. ? Dizziness or feeling light-headed. ? Increased heart rate or palpitations. ? Blurry vision. ? Tingling or numbness around the mouth, lips, or tongue. ? Tremors or shakes. ? Irritability.  Keep a rapid-acting carbohydrate snack available before, during, and after exercise to  help prevent or treat hypoglycemia.  Avoid injecting insulin into areas of the body that are going to be exercised. For example, avoid injecting insulin into: ? The arms, when playing tennis. ? The legs, when jogging.  Keep records of your exercise habits. Doing this can help you and your health care provider adjust your diabetes management plan as needed. Write down: ? Food that you eat before and after you exercise. ? Blood glucose levels before and after you exercise. ? The type and amount of exercise you have done. ? When your insulin is expected to peak, if you use insulin. Avoid exercising at times when your insulin is peaking.  When you start a new exercise or activity, work with your health care provider to make sure the activity is safe for you, and to adjust your insulin, medicines, or food intake as needed.  Drink plenty of water while you exercise to prevent dehydration or heat stroke. Drink enough fluid to keep your urine clear or pale yellow. This information is not intended to replace advice given to you by your health care provider. Make  sure you discuss any questions you have with your health care provider. Document Released: 10/20/2003 Document Revised: 02/17/2016 Document Reviewed: 01/09/2016 Elsevier Interactive Patient Education  2018 [employer].

## 2017-08-12 NOTE — Assessment & Plan Note (Signed)
Normal urine microalbumin. Normal renal function except elevated glucose due to uncontrolled DM. hgb A1c indicates worsening DM. Need to resume metformin. Dose increased to 1000mg  BID. F/up with pcp in 61months.

## 2017-08-20 ENCOUNTER — Telehealth: Payer: Self-pay | Admitting: Neurology

## 2017-08-20 NOTE — Telephone Encounter (Signed)
Patient states that she has not heard anything about the referral to neuro-ophtho please call and give her the status of the referral

## 2017-08-20 NOTE — Telephone Encounter (Signed)
[facility] and got pt scheduled for March 29 at [facility]

## 2017-08-20 NOTE — Telephone Encounter (Signed)
Called pt - relayed message below.  Also gave her the scheduling pone number to [facility] in case she needs to cancel or reschedule appointment.  Pt appreciative of my call.

## 2017-10-31 ENCOUNTER — Ambulatory Visit (INDEPENDENT_AMBULATORY_CARE_PROVIDER_SITE_OTHER): Payer: 59 | Admitting: Family Medicine

## 2017-10-31 ENCOUNTER — Encounter: Payer: Self-pay | Admitting: Family Medicine

## 2017-10-31 DIAGNOSIS — E119 Type 2 diabetes mellitus without complications: Secondary | ICD-10-CM

## 2017-10-31 LAB — POCT GLYCOSYLATED HEMOGLOBIN (HGB A1C): HEMOGLOBIN A1C: 8.6

## 2017-10-31 MED ORDER — METFORMIN HCL 1000 MG PO TABS: 1000.0000 mg | ORAL_TABLET | Freq: Two times a day (BID) | ORAL | 1 refills | Status: DC
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
## 2017-10-31 NOTE — Patient Instructions (Signed)
Please take your Metformin twice daily. Come see me in 3 months.

## 2017-11-02 NOTE — Assessment & Plan Note (Signed)
Admits to not being compliant with Metformin twice daily. She will take twice daily. Follow up in 3 months.

## 2017-11-08 ENCOUNTER — Encounter: Payer: Self-pay | Admitting: Family Medicine

## 2017-11-08 LAB — HM DIABETES EYE EXAM

## 2018-02-03 ENCOUNTER — Other Ambulatory Visit: Payer: Self-pay | Admitting: Family Medicine

## 2018-02-03 ENCOUNTER — Ambulatory Visit (INDEPENDENT_AMBULATORY_CARE_PROVIDER_SITE_OTHER): Payer: 59 | Admitting: Family Medicine

## 2018-02-03 ENCOUNTER — Encounter: Payer: Self-pay | Admitting: Family Medicine

## 2018-02-03 VITALS — BP 130/88 | HR 83 | Temp 98.9°F | Ht 60.0 in | Wt 196.6 lb

## 2018-02-03 DIAGNOSIS — Z23 Encounter for immunization: Secondary | ICD-10-CM

## 2018-02-03 DIAGNOSIS — E119 Type 2 diabetes mellitus without complications: Secondary | ICD-10-CM

## 2018-02-03 LAB — COMPREHENSIVE METABOLIC PANEL
ALBUMIN: 4.1 g/dL (ref 3.5–5.2)
ALT: 13 U/L (ref 0–35)
AST: 15 U/L (ref 0–37)
Alkaline Phosphatase: 122 U/L — ABNORMAL HIGH (ref 39–117)
BUN: 13 mg/dL (ref 6–23)
CHLORIDE: 104 meq/L (ref 96–112)
CO2: 24 mEq/L (ref 19–32)
CREATININE: 0.7 mg/dL (ref 0.40–1.20)
Calcium: 9.2 mg/dL (ref 8.4–10.5)
GFR: 111.77 mL/min (ref >60.00)
GLUCOSE: 182 mg/dL — AB (ref 70–99)
POTASSIUM: 3.8 meq/L (ref 3.5–5.1)
SODIUM: 137 meq/L (ref 135–145)
Total Bilirubin: 0.5 mg/dL (ref 0.2–1.2)
Total Protein: 7.8 g/dL (ref 6.0–8.3)

## 2018-02-03 LAB — LIPID PANEL
CHOLESTEROL: 224 mg/dL — AB (ref 0–200)
HDL: 63.7 mg/dL (ref >39.00)
LDL Cholesterol: 138 mg/dL — ABNORMAL HIGH (ref 0–99)
NONHDL: 160.54
Total CHOL/HDL Ratio: 4
Triglycerides: 115 mg/dL (ref 0.0–149.0)
VLDL: 23 mg/dL (ref 0.0–40.0)

## 2018-02-03 LAB — HEMOGLOBIN A1C: Hgb A1c MFr Bld: 8.7 % — ABNORMAL HIGH (ref 4.6–6.5)

## 2018-02-03 MED ORDER — METFORMIN HCL ER (MOD) 1000 MG PO TB24: 1000.0000 mg | ORAL_TABLET | Freq: Two times a day (BID) | ORAL | 3 refills | Status: DC

## 2018-02-03 NOTE — Assessment & Plan Note (Signed)
Discussed with pt. ? Long acting Metformin would be easier for her to tolerate. Repeat a1c first since she has made dietary changes and lost weight. The patient indicates understanding of these issues and agrees with the plan. Orders Placed This Encounter  Procedures  . Tdap vaccine greater than or equal to 55yo IM  . Hemoglobin A1c  . Lipid panel  . Comprehensive metabolic panel

## 2018-02-03 NOTE — Patient Instructions (Signed)
Great to see you. I will call you with your lab results from today and you can view them online.   
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
## 2018-02-04 NOTE — Telephone Encounter (Signed)
Awaiting response from TA/thx dmf

## 2018-02-05 ENCOUNTER — Other Ambulatory Visit: Payer: Self-pay | Admitting: Family Medicine

## 2018-02-05 MED ORDER — SIMVASTATIN 10 MG PO TABS: 10.0000 mg | ORAL_TABLET | Freq: Every day | ORAL | 3 refills | Status: DC

## 2018-02-07 ENCOUNTER — Other Ambulatory Visit: Payer: Self-pay

## 2018-02-07 ENCOUNTER — Ambulatory Visit (INDEPENDENT_AMBULATORY_CARE_PROVIDER_SITE_OTHER): Payer: 59 | Admitting: Neurology

## 2018-02-07 ENCOUNTER — Encounter: Payer: Self-pay | Admitting: Neurology

## 2018-02-07 VITALS — BP 142/80 | HR 86 | Ht 60.0 in | Wt 198.0 lb

## 2018-02-07 DIAGNOSIS — G43109 Migraine with aura, not intractable, without status migrainosus: Secondary | ICD-10-CM | POA: Diagnosis not present

## 2018-02-07 MED ORDER — AMITRIPTYLINE HCL 10 MG PO TABS: ORAL_TABLET | ORAL | 3 refills | Status: DC

## 2018-02-07 NOTE — Progress Notes (Signed)
NEUROLOGY FOLLOW UP OFFICE NOTE  LYNNETTA [name] [id number]  DOB: 11-30-1960  HISTORY OF PRESENT ILLNESS: I had the pleasure of seeing Linda [name] in follow-up in the neurology clinic on 02/07/2018.  The patient was last seen 6 months ago for dizziness, felt to be secondary to migraines. Symptoms are described as a "floating" sensation when she drives, with associated nausea. MRI brain with and without contrast normal. She saw Dr. Gerlene Fee with [facility] with testing showing normal audiogram and vestibular testing, no evidence of peripheral or central vestibular pathology, symptoms felt to be likely migraine-associated dizziness. She had side effects to Topamax. She had a good response to amitriptyline with improvement in dizziness, and is taking a low dose of 37m qhs without side effects. [facility] at night seeing haloes around headlights making her feel a little confused. Dr. MEarlie Servernote was reviewed, she had symptoms of haloes decreased with pinhole testing, relatively specific for a refractive-type phenomenon usually caused by dry eyes and/or cataracts. She was advised to use eye drops. She is not sure if the eye drops have helped, but does note that lights at night do not bother her as much. The haloes are much better but have not gone away completely. Her driving has really improved, even at night. She occasionally feels "floaty" when driving, but turning her head side to side and sitting up straight seems to help. She denies any headaches, focal numbness/tingling/weakness, nausea/vomiting, no falls.   HPI 03/16/16: This is a pleasant 57yo RH woman with a history of diabetes, GERD, with dizziness. The symptoms started on 01/06/16 while she was driving home when she started feeling as if she would pass out, dizzy with a spinning sensation. She pulled over and called her husband, but was able to eventually drive  home. She noticed she would veer to the right when walking. She saw her PCP and was noted to have nystagmus with Dix-Hallpike testing and impacted cerumen on the left. She returned with ear fullness on the right and again had cerumen disimpaction. She continued to have the dizziness, improved with meclizine, but this caused drowsiness. She underwent vestibular therapy with significant improvement in the dizziness. The spinning sensation has resolved, however she continues to have a residual "floating" sensation particularly noticeable when she is driving. This would be associated with nausea, no vomiting. If she is driving and staying focused forward, she starts feeling "not normal," her ears will pop, "almost like a pressure," then this would be relieved when she looks off to the side. She constantly feels that there is a glare in her vision, more at night, when things do not seem clear. She had an eye exam which was normal. She has seen ENT and has been told she may have had a viral illness, given a steroid injection last June. She reports a history of herpes simplex from many years ago, when stressed out she feels a "nerve pain" on her arms without any visible lesions. She denies any associated headaches, focal numbness/tingling/weakness. She reports that 3 months prior to the onset of these symptoms, she noticed she would feel lightheaded in the morning while driving. No diplopia, dysarthria/dysphagia, bowel/bladder dysfunction. She has some left-sided neck pain that improved with PT. She denies any head injuries or recent infections. She had been taking Phentermine for weight loss, but stopped it in May when she started having the neck pain. She denies any  daytime drowsiness, sleep is good.  PAST MEDICAL HISTORY: Past Medical History:  Diagnosis Date  . Allergic rhinitis   . Allergy    SEASONAL  . Anemia   . Cardiac murmur    1/6 SEM at [facility] (old finding per patient)  . Diabetes mellitus without  complication ([place])   . Obesity     MEDICATIONS: Current Outpatient Medications on File Prior to Visit  Medication Sig Dispense Refill  . amitriptyline (ELAVIL) 10 MG tablet Take 1 tablet at night 90 tablet 3  . Blood Glucose Monitoring Suppl (ONE TOUCH ULTRA SYSTEM KIT) W/DEVICE KIT 1 kit by Does not apply route once. 1 each 0  . glucose blood test strip One Touch Ultra 100 each 3  . meclizine (ANTIVERT) 25 MG tablet Take 1 tablet (25 mg total) by mouth 3 (three) times daily as needed for dizziness. 30 tablet 5  . metformin (FORTAMET) 1000 MG (OSM) 24 hr tablet Take 1 tablet (1,000 mg total) by mouth 2 (two) times daily. (Start with 1qam; if tolerates then increase to bid) 60 tablet 2  . Omega-3 Fatty Acids (OMEGA-3 FISH OIL PO) Take by mouth.    . simvastatin (ZOCOR) 10 MG tablet Take 1 tablet (10 mg total) by mouth at bedtime. 90 tablet 3   No current facility-administered medications on file prior to visit.     ALLERGIES: Allergies  Allergen Reactions  . Morphine Itching    Hydrocodone is OK  . Topamax [Topiramate] Rash    FAMILY HISTORY: Family History  Problem Relation Age of Onset  . Hypertension Mother   . Diabetes Maternal Aunt   . Heart disease Maternal Grandmother   . Heart disease Paternal Grandmother   . Kidney disease Maternal Aunt   . Colon cancer Neg Hx   . Stomach cancer Neg Hx   . Pancreatic cancer Neg Hx   . Esophageal cancer Neg Hx   . Liver disease Neg Hx     SOCIAL HISTORY: Social History   Socioeconomic History  . Marital status: Married    Spouse name: Not on file  . Number of children: 0  . Years of education: Masters  . Highest education level: Not on file  Occupational History  . 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 Financial resource strain: Not on file  . Food insecurity:    Worry: Not on file    Inability: Not on file  . Transportation needs:    Medical: Not on file    Non-medical: Not  on file  Tobacco Use  . Smoking status: Never Smoker  . Smokeless tobacco: Never Used  Substance and Sexual Activity  . Alcohol use: Yes    Alcohol/week: 0.0 oz    Comment: monthly  . Drug use: No  . Sexual activity: Not on file  Lifestyle  . Physical activity:    Days per week: Not on file    Minutes per session: Not on file  . Stress: Not on file  Relationships  . Social connections:    Talks on phone: Not on file    Gets together: Not on file    Attends religious service: Not on file    Active member of club or organization: Not on file    Attends meetings of clubs or organizations: Not on file    Relationship status: Not on file  . Intimate partner violence:    Fear of current or ex partner:  Not on file    Emotionally abused: Not on file    Physically abused: Not on file    Forced sexual activity: Not on file  Other Topics Concern  . Not on file  Social History Narrative   Drinks coffee about once a month    [place], [place] 2013   Lives with husband in a one story home.  Has one stepson.  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    REVIEW OF SYSTEMS: Constitutional: No fevers, chills, or sweats, no generalized fatigue, change in appetite Eyes: No visual changes, double vision, eye pain Ear, nose and throat: No hearing loss, ear pain, nasal congestion, sore throat Cardiovascular: No chest pain, palpitations Respiratory:  No shortness of breath at rest or with exertion, wheezes GastrointestinaI: No nausea, vomiting, diarrhea, abdominal pain, fecal incontinence Genitourinary:  No dysuria, urinary retention or frequency Musculoskeletal:  No neck pain, back pain Integumentary: No rash, pruritus, skin lesions Neurological: as above Psychiatric: No depression, insomnia, anxiety Endocrine: No palpitations, fatigue, diaphoresis, mood swings, change in appetite, change in weight, increased thirst Hematologic/Lymphatic:  No anemia, purpura, petechiae. Allergic/Immunologic: no  itchy/runny eyes, nasal congestion, recent allergic reactions, rashes  PHYSICAL EXAM: Vitals:   02/07/18 1536  BP: (!) 142/80  Pulse: 86  SpO2: 97%   General: No acute distress Head:  Normocephalic/atraumatic Neck: supple, no paraspinal tenderness, full range of motion Heart:  Regular rate and rhythm Lungs:  Clear to auscultation bilaterally Back: No paraspinal tenderness Skin/Extremities: No rash, no edema Neurological Exam: alert and oriented to person, place, and time. No aphasia or dysarthria. Fund of knowledge is appropriate.  Recent and remote memory are intact.  Attention and concentration are normal.    Able to name objects and repeat phrases. Cranial nerves: Pupils equal, round, reactive to light.  Fundoscopic exam unremarkable, no papilledema. Extraocular movements intact with no nystagmus. Visual fields full. Facial sensation intact. No facial asymmetry. Tongue, uvula, palate midline.  Motor: Bulk and tone normal, muscle strength 5/5 throughout with no pronator drift.  Sensation to light touch intact.  No extinction to double simultaneous stimulation.  Finger to nose testing intact.  Gait narrow-based and steady, able to tandem walk adequately.  Romberg negative.  IMPRESSION: This is a pleasant 57 yo RH woman with a history of diabetes, GERD with new onset dizziness that started 01/06/16. The dizziness was initially vertiginous with spinning sensation, that resolved with vestibular therapy, however she continued to have a residual "floating" sensation when she drives, with associated nausea. Her neurological exam is non-focal. MRI brain normal. She underwent vestibular testing and an audiogram at [facility] with normal findings, no evidence of peripheral or central vestibular dysfunction, and symptoms were felt to be due to migraine-associated dizziness. She had improvement in symptoms with amitriptyline, and reports continued improvement since her last visit, now able to drive at night. We  discussed trying to wean off amitriptyline, but if symptoms recur, restart medication. She will follow-up in 6 months and knows to call for any changes.   Thank you for allowing me to participate in her care.  Please do not hesitate to call for any questions or concerns.  The duration of this appointment visit was 20 minutes of face-to-face time with the patient.  Greater than 50% of this time was spent in counseling, explanation of diagnosis, planning of further management, and coordination of care.   Ellouise Newer, M.D.   CC: Dr. Deborra Medina

## 2018-02-07 NOTE — Patient Instructions (Signed)
1. Try tapering amitriptyline to 1 every other day for a week, then stop. If at any point symptoms worsen, resume medication  2. Follow-up in 6 months, call for any changes

## 2018-04-30 ENCOUNTER — Other Ambulatory Visit: Payer: Self-pay | Admitting: Family Medicine

## 2018-05-07 ENCOUNTER — Ambulatory Visit (INDEPENDENT_AMBULATORY_CARE_PROVIDER_SITE_OTHER): Payer: 59 | Admitting: Family Medicine

## 2018-05-07 ENCOUNTER — Encounter: Payer: Self-pay | Admitting: Family Medicine

## 2018-05-07 VITALS — BP 136/84 | HR 80 | Temp 98.5°F | Ht 60.0 in | Wt 198.4 lb

## 2018-05-07 DIAGNOSIS — E119 Type 2 diabetes mellitus without complications: Secondary | ICD-10-CM | POA: Diagnosis not present

## 2018-05-07 DIAGNOSIS — Z23 Encounter for immunization: Secondary | ICD-10-CM

## 2018-05-07 DIAGNOSIS — E785 Hyperlipidemia, unspecified: Secondary | ICD-10-CM | POA: Insufficient documentation

## 2018-05-07 LAB — COMPREHENSIVE METABOLIC PANEL
ALT: 16 U/L (ref 0–35)
AST: 16 U/L (ref 0–37)
Albumin: 4.1 g/dL (ref 3.5–5.2)
Alkaline Phosphatase: 116 U/L (ref 39–117)
BUN: 16 mg/dL (ref 6–23)
CHLORIDE: 102 meq/L (ref 96–112)
CO2: 25 mEq/L (ref 19–32)
Calcium: 9.4 mg/dL (ref 8.4–10.5)
Creatinine, Ser: 0.67 mg/dL (ref 0.40–1.20)
GFR: 117.45 mL/min (ref >60.00)
Glucose, Bld: 169 mg/dL — ABNORMAL HIGH (ref 70–99)
POTASSIUM: 4.1 meq/L (ref 3.5–5.1)
SODIUM: 136 meq/L (ref 135–145)
TOTAL PROTEIN: 8 g/dL (ref 6.0–8.3)
Total Bilirubin: 0.7 mg/dL (ref 0.2–1.2)

## 2018-05-07 LAB — HEMOGLOBIN A1C: HEMOGLOBIN A1C: 8.2 % — AB (ref 4.6–6.5)

## 2018-05-07 LAB — TSH: TSH: 0.73 u[IU]/mL (ref 0.35–4.50)

## 2018-05-07 MED ORDER — SIMVASTATIN 10 MG PO TABS: 10.0000 mg | ORAL_TABLET | Freq: Every day | ORAL | 3 refills | Status: DC

## 2018-05-07 NOTE — Assessment & Plan Note (Signed)
zocor resent to pharmacy. Repeat lipid panel when she follows up with me in 3 months. The patient indicates understanding of these issues and agrees with the plan.

## 2018-05-07 NOTE — Patient Instructions (Addendum)
Great to see you. I will call you with your lab results from today and you can view them online.   Please start taking simvastatin.    We can check your cholesterol when you return in 3 months for a follow up visit.  Please sign release for Dr. Sherlynn Stalls office to send [place] updated PAP information :)

## 2018-05-07 NOTE — Assessment & Plan Note (Signed)
Tolerating Metformin XR.  Check a1c today.  Eat right diet hand out given.
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
## 2018-05-21 ENCOUNTER — Ambulatory Visit (INDEPENDENT_AMBULATORY_CARE_PROVIDER_SITE_OTHER): Payer: 59 | Admitting: Nurse Practitioner

## 2018-05-21 ENCOUNTER — Encounter: Payer: Self-pay | Admitting: Nurse Practitioner

## 2018-05-21 VITALS — BP 140/92 | HR 73 | Temp 98.4°F | Ht 60.0 in | Wt 197.0 lb

## 2018-05-21 DIAGNOSIS — B309 Viral conjunctivitis, unspecified: Secondary | ICD-10-CM | POA: Diagnosis not present

## 2018-05-21 DIAGNOSIS — R51 Headache: Secondary | ICD-10-CM

## 2018-05-21 DIAGNOSIS — R519 Headache, unspecified: Secondary | ICD-10-CM

## 2018-05-21 MED ORDER — FLUTICASONE PROPIONATE 50 MCG/ACT NA SUSP: 2.0000 | Freq: Every day | NASAL | 0 refills | Status: DC

## 2018-05-21 MED ORDER — OLOPATADINE HCL 0.2 % OP SOLN: 1.0000 [drp] | Freq: Every day | OPHTHALMIC | 0 refills | Status: DC

## 2018-05-21 MED ORDER — KETOROLAC TROMETHAMINE 30 MG/ML IJ SOLN
30.0000 mg | Freq: Once | INTRAMUSCULAR | Status: AC
  Administered 2018-05-21: 30 mg via INTRAMUSCULAR

## 2018-05-21 NOTE — Patient Instructions (Signed)
Sinus Headache A sinus headache happens when your sinuses become clogged or swollen. You may feel pain or pressure in your face, forehead, ears, or upper teeth. Sinus headaches can be mild or severe. Follow these instructions at home:  Take medicines only as told by your doctor.  If you were given an antibiotic medicine, finish all of it even if you start to feel better.  Use a nose spray if you feel stuffed up (congested).  If told, apply a warm, moist washcloth to your face to help lessen pain. Contact a doctor if:  You get headaches more than one time each week.  Light or sound bothers you.  You have a fever.  You feel sick to your stomach (nauseous) or you throw up (vomit).  Your headaches do not get better with treatment. Get help right away if:  You have trouble seeing.  You suddenly have very bad pain in your face or head.  You start to twitch or shake (seizure).  You are confused.  You have a stiff neck. This information is not intended to replace advice given to you by your health care provider. Make sure you discuss any questions you have with your health care provider. Document Released: 11/29/2010 Document Revised: 03/25/2016 Document Reviewed: 07/26/2014 Elsevier Interactive Patient Education  2018 Elsevier Inc.  

## 2018-05-21 NOTE — Progress Notes (Signed)
Subjective:  Patient ID: Linda [name], female    DOB: 10-23-1960  Age: 57 y.o. MRN: [id number]  CC: Headache (patient is complaining of sinus pressure,little light headed,sneezing,throat discomfort,clammy,eyes drainage. going on 3 days. )  Headache   This is a new problem. The current episode started in the past 7 days. The problem occurs intermittently. The problem has been unchanged. The pain is located in the frontal region. The pain does not radiate. The pain quality is similar to prior headaches. The quality of the pain is described as aching and dull. Associated symptoms include dizziness, eye watering and sinus pressure. Pertinent negatives include no abnormal behavior, anorexia, blurred vision, coughing, drainage, ear pain, fever, hearing loss, insomnia, loss of balance, muscle aches, nausea, neck pain, numbness, phonophobia, photophobia, rhinorrhea, scalp tenderness, sore throat, swollen glands, tinnitus, visual change, vomiting or weakness. Nothing aggravates the symptoms. She has tried nothing for the symptoms. Her past medical history is significant for migraine headaches and obesity. There is no history of recent head traumas or sinus disease.   Reviewed past Medical, Social and Family history today.  Outpatient Medications Prior to Visit  Medication Sig Dispense Refill  . amitriptyline (ELAVIL) 10 MG tablet Take 1 tablet at night 90 tablet 3  . Blood Glucose Monitoring Suppl (ONE TOUCH ULTRA SYSTEM KIT) W/DEVICE KIT 1 kit by Does not apply route once. 1 each 0  . glucose blood test strip One Touch Ultra 100 each 3  . meclizine (ANTIVERT) 25 MG tablet Take 1 tablet (25 mg total) by mouth 3 (three) times daily as needed for dizziness. 30 tablet 5  . metFORMIN (GLUCOPHAGE-XR) 500 MG 24 hr tablet TAKE 2 TABLETS BY MOUTH 2 TIMES DAILY (START 2 TABLETS IN THE AM IF TOLERATED THEN INCREASE 2X DAILY 120 tablet 0  . Omega-3 Fatty Acids (OMEGA-3 FISH OIL PO) Take by mouth.    .  simvastatin (ZOCOR) 10 MG tablet Take 1 tablet (10 mg total) by mouth at bedtime. 90 tablet 3   No facility-administered medications prior to visit.     ROS See HPI  Objective:  BP (!) 140/92   Pulse 73   Temp 98.4 F (36.9 C) (Oral)   Ht 5' (1.524 m)   Wt 197 lb (89.4 kg)   SpO2 98%   BMI 38.47 kg/m   BP Readings from Last 3 Encounters:  05/21/18 (!) 140/92  05/07/18 136/84  02/07/18 (!) 142/80    Wt Readings from Last 3 Encounters:  05/21/18 197 lb (89.4 kg)  05/07/18 198 lb 6.4 oz (90 kg)  02/07/18 198 lb (89.8 kg)    Physical Exam  Constitutional: She is oriented to person, place, and time. She appears well-developed and well-nourished.  HENT:  Right Ear: Tympanic membrane, external ear and ear canal normal.  Left Ear: Tympanic membrane, external ear and ear canal normal.  Nose: Mucosal edema present. No rhinorrhea or sinus tenderness. Right sinus exhibits maxillary sinus tenderness. Right sinus exhibits no frontal sinus tenderness. Left sinus exhibits maxillary sinus tenderness. Left sinus exhibits no frontal sinus tenderness.  Mouth/Throat: Oropharynx is clear and moist.  Eyes: Pupils are equal, round, and reactive to light. EOM are normal. Right eye exhibits no chemosis, no discharge, no exudate and no hordeolum. No foreign body present in the right eye. Left eye exhibits chemosis. Left eye exhibits no discharge, no exudate and no hordeolum. No foreign body present in the left eye. Right conjunctiva is not injected. Right conjunctiva has no hemorrhage.  Left conjunctiva is injected. Left conjunctiva has no hemorrhage. No scleral icterus.  Neck: Normal range of motion. Neck supple.  Cardiovascular: Normal rate and regular rhythm.  Pulmonary/Chest: Effort normal and breath sounds normal.  Musculoskeletal: Normal range of motion.  Lymphadenopathy:    She has no cervical adenopathy.  Neurological: She is alert and oriented to person, place, and time. She has normal  strength.  Psychiatric: She has a normal mood and affect. Her behavior is normal. Thought content normal.  Vitals reviewed.   Lab Results  Component Value Date   WBC 8.4 12/11/2007   HGB 8.5 (L) 12/11/2007   HCT 24.9 (L) 12/11/2007   PLT 222 12/11/2007   GLUCOSE 169 (H) 05/07/2018   CHOL 224 (H) 02/03/2018   TRIG 115.0 02/03/2018   HDL 63.70 02/03/2018   LDLDIRECT 118.9 07/05/2014   LDLCALC 138 (H) 02/03/2018   ALT 16 05/07/2018   AST 16 05/07/2018   NA 136 05/07/2018   K 4.1 05/07/2018   CL 102 05/07/2018   CREATININE 0.67 05/07/2018   BUN 16 05/07/2018   CO2 25 05/07/2018   TSH 0.73 05/07/2018   INR 0.9 12/08/2007   HGBA1C 8.2 (H) 05/07/2018   MICROALBUR 1.2 08/02/2017    Mm Diag Breast Tomo Bilateral  Result Date: 01/03/2017 CLINICAL DATA:  Delayed follow-up of the left breast and annual examination of the right breast. Probably benign mass or asymmetry was noted in the left breast on the MLO view of screening mammogram in March 2017 and diagnostic mammogram of April 2017. No suspicious findings were seen on ultrasound. EXAM: 2D DIGITAL DIAGNOSTIC BILATERAL MAMMOGRAM WITH CAD AND ADJUNCT TOMO COMPARISON:  Previous exam(s). ACR Breast Density Category b: There are scattered areas of fibroglandular density. FINDINGS: The parenchymal pattern of both breasts is stable. Asymmetry adjacent to a vessel in the posterior left breast on the MLO view appears tubular and may be due to a vessel branch. There is no suspicious mass in this region of the left breast, and no change compared to the mammogram of 2017. The remainder of the left breast is negative. There is no architectural distortion or suspicious microcalcification. No suspicious mass, architectural distortion, or suspicious microcalcification is identified in the right breast to suggest malignancy. Mammographic images were processed with CAD. IMPRESSION: No evidence of malignancy in either breast. RECOMMENDATION: Screening  mammogram in one year.(Code:SM-B-01Y) I have discussed the findings and recommendations with the patient. Results were also provided in writing at the conclusion of the visit. If applicable, a reminder letter will be sent to the patient regarding the next appointment. BI-RADS CATEGORY  2: Benign. Electronically Signed   By: Curlene Dolphin M.D.   On: 01/03/2017 13:46    Assessment & Plan:   Galina was seen today for headache.  Diagnoses and all orders for this visit:  Sinus headache -     ketorolac (TORADOL) 30 MG/ML injection 30 mg -     fluticasone (FLONASE) 50 MCG/ACT nasal spray; Place 2 sprays into both nostrils daily.  Viral conjunctivitis of left eye -     Olopatadine HCl 0.2 % SOLN; Apply 1 drop to eye daily. Use x 1week   I am having Anjali H. Noell start on Olopatadine HCl and fluticasone. I am also having her maintain her [place] KIT, glucose blood, Omega-3 Fatty Acids (OMEGA-3 FISH OIL PO), meclizine, amitriptyline, metFORMIN, and simvastatin. We administered ketorolac.  Meds ordered this encounter  Medications  . ketorolac (TORADOL) 30 MG/ML  injection 30 mg  . Olopatadine HCl 0.2 % SOLN    Sig: Apply 1 drop to eye daily. Use x 1week    Dispense:  2.5 mL    Refill:  0    Order Specific Question:   Supervising Provider    Answer:   Lucille Passy [[id number]]  . fluticasone (FLONASE) 50 MCG/ACT nasal spray    Sig: Place 2 sprays into both nostrils daily.    Dispense:  16 g    Refill:  0    Order Specific Question:   Supervising Provider    Answer:   Lucille Passy [[id number]]    Follow-up: No follow-ups on file.  Wilfred Lacy, NP

## 2018-05-22 ENCOUNTER — Ambulatory Visit: Payer: 59 | Admitting: Family Medicine

## 2018-05-30 ENCOUNTER — Other Ambulatory Visit: Payer: Self-pay | Admitting: Neurology

## 2018-05-30 DIAGNOSIS — G43109 Migraine with aura, not intractable, without status migrainosus: Secondary | ICD-10-CM

## 2018-06-02 ENCOUNTER — Other Ambulatory Visit: Payer: Self-pay | Admitting: Obstetrics & Gynecology

## 2018-06-02 DIAGNOSIS — Z1231 Encounter for screening mammogram for malignant neoplasm of breast: Secondary | ICD-10-CM

## 2018-06-30 ENCOUNTER — Other Ambulatory Visit: Payer: Self-pay | Admitting: Nurse Practitioner

## 2018-06-30 ENCOUNTER — Other Ambulatory Visit: Payer: Self-pay | Admitting: Family Medicine

## 2018-06-30 DIAGNOSIS — R519 Headache, unspecified: Secondary | ICD-10-CM

## 2018-06-30 DIAGNOSIS — R51 Headache: Principal | ICD-10-CM

## 2018-07-08 ENCOUNTER — Ambulatory Visit
Admission: RE | Admit: 2018-07-08 | Discharge: 2018-07-08 | Disposition: A | Discharge status: Home / Self Care | Payer: 59 | Source: Ambulatory Visit | Attending: Obstetrics & Gynecology | Admitting: Obstetrics & Gynecology

## 2018-07-08 DIAGNOSIS — Z1231 Encounter for screening mammogram for malignant neoplasm of breast: Secondary | ICD-10-CM

## 2018-07-24 ENCOUNTER — Ambulatory Visit (INDEPENDENT_AMBULATORY_CARE_PROVIDER_SITE_OTHER): Payer: 59 | Admitting: Neurology

## 2018-07-24 ENCOUNTER — Encounter: Payer: Self-pay | Admitting: Neurology

## 2018-07-24 ENCOUNTER — Encounter

## 2018-07-24 DIAGNOSIS — G43109 Migraine with aura, not intractable, without status migrainosus: Secondary | ICD-10-CM | POA: Diagnosis not present

## 2018-07-24 MED ORDER — AMITRIPTYLINE HCL 10 MG PO TABS: ORAL_TABLET | ORAL | 3 refills | Status: DC

## 2018-07-24 NOTE — Patient Instructions (Signed)
1. Schedule 1-hour EEG, then 48-hour EEG 2. Continue amitriptyline 20mg  every night 3. Call Dr. Gerlene Fee at [facility] to schedule follow-up [phone]) 4. Follow-up in 6 months, call for any changes
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
## 2018-07-30 DIAGNOSIS — R519 Headache, unspecified: Secondary | ICD-10-CM | POA: Insufficient documentation

## 2018-07-31 ENCOUNTER — Other Ambulatory Visit: Payer: 59

## 2018-08-04 ENCOUNTER — Ambulatory Visit: Payer: 59 | Admitting: Family Medicine

## 2018-08-11 ENCOUNTER — Encounter: Payer: Self-pay | Admitting: Family Medicine

## 2018-08-11 ENCOUNTER — Ambulatory Visit (INDEPENDENT_AMBULATORY_CARE_PROVIDER_SITE_OTHER): Payer: 59 | Admitting: Family Medicine

## 2018-08-11 VITALS — BP 128/86 | HR 89 | Temp 98.3°F | Ht 60.0 in | Wt 204.2 lb

## 2018-08-11 DIAGNOSIS — E119 Type 2 diabetes mellitus without complications: Secondary | ICD-10-CM | POA: Diagnosis not present

## 2018-08-11 DIAGNOSIS — E785 Hyperlipidemia, unspecified: Secondary | ICD-10-CM | POA: Diagnosis not present

## 2018-08-11 DIAGNOSIS — G43109 Migraine with aura, not intractable, without status migrainosus: Secondary | ICD-10-CM | POA: Diagnosis not present

## 2018-08-11 LAB — POCT GLYCOSYLATED HEMOGLOBIN (HGB A1C)
HbA1c POC (<> result, manual entry): 7.8 % (ref 4.0–5.6)
Hemoglobin A1C: 7.8 % — AB (ref 4.0–5.6)

## 2018-08-11 NOTE — Patient Instructions (Signed)
Great to see you. I will call you with your lab results from today and you can view them online.   Happy New Year!

## 2018-08-11 NOTE — Assessment & Plan Note (Addendum)
Improved since she restarted elavil 10 mg qhs. Followed by [facility]. >25 minutes spent in face to face time with patient, >50% spent in counselling or coordination of care discussing migraines, DM, HLD.
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
## 2018-08-11 NOTE — Assessment & Plan Note (Signed)
Has been compliant with zocor. Check lipid panel today.

## 2018-08-11 NOTE — Assessment & Plan Note (Signed)
Improved, she will on exercise and diet. Continue current medication.  Follow up in 3 months.

## 2018-08-12 LAB — LIPID PANEL
CHOL/HDL RATIO: 3
CHOLESTEROL: 191 mg/dL (ref 0–200)
HDL: 75.4 mg/dL (ref >39.00)
LDL Cholesterol: 97 mg/dL (ref 0–99)
NonHDL: 115.49
TRIGLYCERIDES: 91 mg/dL (ref 0.0–149.0)
VLDL: 18.2 mg/dL (ref 0.0–40.0)

## 2018-08-12 LAB — MICROALBUMIN / CREATININE URINE RATIO
CREATININE, U: 234.9 mg/dL
Microalb Creat Ratio: 0.4 mg/g (ref 0.0–30.0)
Microalb, Ur: 1 mg/dL (ref 0.0–1.9)

## 2018-09-01 ENCOUNTER — Ambulatory Visit: Payer: Self-pay

## 2018-09-01 NOTE — Telephone Encounter (Signed)
Message from Esaw Dace sent at 09/01/2018 2:26 PM EST   Summary: Exposure to Flu   Pt stated that her husband has gotten the flu and she would like to know if Tamiflu can be called in as a preventative. Please advise. CB# [phone] [id number] [place], [place] - [address] [phone] (Phone) [phone] (Fax)         Please advise

## 2018-09-02 MED ORDER — OSELTAMIVIR PHOSPHATE 75 MG PO CAPS: 75.0000 mg | ORAL_CAPSULE | Freq: Every day | ORAL | 0 refills | Status: DC

## 2018-09-02 NOTE — Telephone Encounter (Signed)
Spoke with pt and she is aware of the medication that has been sent to pharmacy.

## 2018-09-02 NOTE — Telephone Encounter (Signed)
Dr. Deborra Medina please advise notes below. Pt husband dx with flu and would like to know if we send her in some Tamiflu. Please let me know if you would like for me to send this in for you.   Thanks

## 2018-09-02 NOTE — Telephone Encounter (Signed)
I am so sorry to hear that and I hope he feels better soon.  Tamiflu prophylaxis sent to pharmacy on file.

## 2018-09-02 NOTE — Addendum Note (Signed)
Addended by: Lucille Passy on: 09/02/2018 11:24 AM   Modules accepted: Orders

## 2018-09-04 ENCOUNTER — Other Ambulatory Visit: Payer: Self-pay | Admitting: *Deleted

## 2018-09-04 MED ORDER — OSELTAMIVIR PHOSPHATE 75 MG PO CAPS: 75.0000 mg | ORAL_CAPSULE | Freq: Every day | ORAL | 0 refills | Status: DC

## 2018-09-05 ENCOUNTER — Telehealth: Payer: Self-pay | Admitting: Nurse Practitioner

## 2018-09-05 ENCOUNTER — Ambulatory Visit (INDEPENDENT_AMBULATORY_CARE_PROVIDER_SITE_OTHER): Payer: 59 | Admitting: Nurse Practitioner

## 2018-09-05 ENCOUNTER — Telehealth: Payer: Self-pay

## 2018-09-05 ENCOUNTER — Encounter: Payer: Self-pay | Admitting: Nurse Practitioner

## 2018-09-05 VITALS — BP 140/92 | HR 91 | Temp 98.6°F | Ht 60.0 in | Wt 206.6 lb

## 2018-09-05 DIAGNOSIS — R6889 Other general symptoms and signs: Secondary | ICD-10-CM

## 2018-09-05 LAB — POC INFLUENZA A&B (BINAX/QUICKVUE)
Influenza A, POC: NEGATIVE
Influenza B, POC: NEGATIVE

## 2018-09-05 MED ORDER — ONDANSETRON HCL 4 MG PO TABS: 4.0000 mg | ORAL_TABLET | Freq: Three times a day (TID) | ORAL | 0 refills | Status: DC | PRN

## 2018-09-05 MED ORDER — BENZONATATE 100 MG PO CAPS: 100.0000 mg | ORAL_CAPSULE | Freq: Three times a day (TID) | ORAL | 0 refills | Status: DC | PRN

## 2018-09-05 MED ORDER — PROMETHAZINE-DM 6.25-15 MG/5ML PO SYRP: 5.0000 mL | ORAL_SOLUTION | Freq: Three times a day (TID) | ORAL | 0 refills | Status: DC | PRN

## 2018-09-05 MED ORDER — OSELTAMIVIR PHOSPHATE 75 MG PO CAPS: 75.0000 mg | ORAL_CAPSULE | Freq: Two times a day (BID) | ORAL | 0 refills | Status: DC

## 2018-09-05 NOTE — Telephone Encounter (Signed)
[facility] called-Pt states that the cough med is on back order and needs an alternative/thx dmf

## 2018-09-05 NOTE — Patient Instructions (Signed)
Increase tamiflu dose to 75mg  BID x 5days.  Use tylenol or ibuprofen for fever and bodyaches.  Maintain adequate oral hydration.  Call office if no improvement in 5days.   Influenza, Adult Influenza, more commonly known as "the flu," is a viral infection that mainly affects the respiratory tract. The respiratory tract includes organs that help you breathe, such as the lungs, nose, and throat. The flu causes many symptoms similar to the common cold along with high fever and body aches. The flu spreads easily from person to person (is contagious). Getting a flu shot (influenza vaccination) every year is the best way to prevent the flu. What are the causes? This condition is caused by the influenza virus. You can get the virus by:  Breathing in droplets that are in the air from an infected person's cough or sneeze.  Touching something that has been exposed to the virus (has been contaminated) and then touching your mouth, nose, or eyes. What increases the risk? The following factors may make you more likely to get the flu:  Not washing or sanitizing your hands often.  Having close contact with many people during cold and flu season.  Touching your mouth, eyes, or nose without first washing or sanitizing your hands.  Not getting a yearly (annual) flu shot. You may have a higher risk for the flu, including serious problems such as a lung infection (pneumonia), if you:  Are older than 65.  Are pregnant.  Have a weakened disease-fighting system (immune system). You may have a weakened immune system if you: ? Have HIV or AIDS. ? Are undergoing chemotherapy. ? Are taking medicines that reduce (suppress) the activity of your immune system.  Have a long-term (chronic) illness, such as heart disease, kidney disease, diabetes, or lung disease.  Have a liver disorder.  Are severely overweight (morbidly obese).  Have anemia. This is a condition that affects your red blood cells.  Have  asthma. What are the signs or symptoms? Symptoms of this condition usually begin suddenly and last 4-14 days. They may include:  Fever and chills.  Headaches, body aches, or muscle aches.  Sore throat.  Cough.  Runny or stuffy (congested) nose.  Chest discomfort.  Poor appetite.  Weakness or fatigue.  Dizziness.  Nausea or vomiting. How is this diagnosed? This condition may be diagnosed based on:  Your symptoms and medical history.  A physical exam.  Swabbing your nose or throat and testing the fluid for the influenza virus. How is this treated? If the flu is diagnosed early, you can be treated with medicine that can help reduce how severe the illness is and how long it lasts (antiviral medicine). This may be given by mouth (orally) or through an IV. Taking care of yourself at home can help relieve symptoms. Your health care provider may recommend:  Taking over-the-counter medicines.  Drinking plenty of fluids. In many cases, the flu goes away on its own. If you have severe symptoms or complications, you may be treated in a hospital. Follow these instructions at home: Activity  Rest as needed and get plenty of sleep.  Stay home from work or school as told by your health care provider. Unless you are visiting your health care provider, avoid leaving home until your fever has been gone for 24 hours without taking medicine. Eating and drinking  Take an oral rehydration solution (ORS). This is a drink that is sold at pharmacies and retail stores.  Drink enough fluid to keep your  urine pale yellow.  Drink clear fluids in small amounts as you are able. Clear fluids include water, ice chips, diluted fruit juice, and low-calorie sports drinks.  Eat bland, easy-to-digest foods in small amounts as you are able. These foods include bananas, applesauce, rice, lean meats, toast, and crackers.  Avoid drinking fluids that contain a lot of sugar or caffeine, such as energy  drinks, regular sports drinks, and soda.  Avoid alcohol.  Avoid spicy or fatty foods. General instructions      Take over-the-counter and prescription medicines only as told by your health care provider.  Use a cool mist humidifier to add humidity to the air in your home. This can make it easier to breathe.  Cover your mouth and nose when you cough or sneeze.  Wash your hands with soap and water often, especially after you cough or sneeze. If soap and water are not available, use alcohol-based hand sanitizer.  Keep all follow-up visits as told by your health care provider. This is important. How is this prevented?   Get an annual flu shot. You may get the flu shot in late summer, fall, or winter. Ask your health care provider when you should get your flu shot.  Avoid contact with people who are sick during cold and flu season. This is generally fall and winter. Contact a health care provider if:  You develop new symptoms.  You have: ? Chest pain. ? Diarrhea. ? A fever.  Your cough gets worse.  You produce more mucus.  You feel nauseous or you vomit. Get help right away if:  You develop shortness of breath or difficulty breathing.  Your skin or nails turn a bluish color.  You have severe pain or stiffness in your neck.  You develop a sudden headache or sudden pain in your face or ear.  You cannot eat or drink without vomiting. Summary  Influenza, more commonly known as "the flu," is a viral infection that primarily affects your respiratory tract.  Symptoms of the flu usually begin suddenly and last 4-14 days.  Getting an annual flu shot is the best way to prevent getting the flu.  Stay home from work or school as told by your health care provider. Unless you are visiting your health care provider, avoid leaving home until your fever has been gone for 24 hours without taking medicine.  Keep all follow-up visits as told by your health care provider. This is  important. This information is not intended to replace advice given to you by your health care provider. Make sure you discuss any questions you have with your health care provider. Document Released: 07/27/2000 Document Revised: 01/15/2018 Document Reviewed: 01/15/2018 Elsevier Interactive Patient Education  2019 [employer].

## 2018-09-05 NOTE — Telephone Encounter (Signed)
Please advise.     Copied from [id number]. Topic: General - Other >> Sep 05, 2018  3:20 PM Carolyn Stare wrote: [facility] [place] called they said that promethazine-dextromethorphan (PROMETHAZINE-DM) 6.25-15 MG/5ML syrup   is on back order   And is asking if the doctor would like to change to something else

## 2018-09-05 NOTE — Telephone Encounter (Signed)
Sent zofran and benzonatate in [place]

## 2018-09-05 NOTE — Progress Notes (Signed)
Subjective:  Patient ID: [name], female    DOB: January 19, 1961  Age: 58 y.o. MRN: [id number]  CC: Cough (non productive cough, headache, nauseous/ 2 days/ OTC metamucil/ husband dx with flu/ Dr. Deborra Medina sent in prophalaxis tamiflu taken 1 )  Cough  This is a new problem. The current episode started yesterday. The problem has been unchanged. The cough is non-productive. Associated symptoms include chills, nasal congestion, postnasal drip, rhinorrhea, a sore throat and shortness of breath. The symptoms are aggravated by lying down. Risk factors for lung disease include smoking/tobacco exposure. She has tried OTC cough suppressant for the symptoms.  she wants to be tested for influenza even though she was directly exposed and started tamiflu yesterday.  Reviewed past Medical, Social and Family history today.  Outpatient Medications Prior to Visit  Medication Sig Dispense Refill  . amitriptyline (ELAVIL) 10 MG tablet Take 2 tablets at night 180 tablet 3  . Blood Glucose Monitoring Suppl (ONE TOUCH ULTRA SYSTEM KIT) W/DEVICE KIT 1 kit by Does not apply route once. 1 each 0  . fluticasone (FLONASE) 50 MCG/ACT nasal spray SPRAY 2 SPRAYS INTO EACH NOSTRIL EVERY DAY 16 g 1  . glucose blood test strip One Touch Ultra 100 each 3  . meclizine (ANTIVERT) 25 MG tablet Take 1 tablet (25 mg total) by mouth 3 (three) times daily as needed for dizziness. 30 tablet 5  . metFORMIN (GLUCOPHAGE-XR) 500 MG 24 hr tablet Take 2bid 360 tablet 1  . Olopatadine HCl 0.2 % SOLN Apply 1 drop to eye daily. Use x 1week 2.5 mL 0  . Omega-3 Fatty Acids (OMEGA-3 FISH OIL PO) Take by mouth.    . simvastatin (ZOCOR) 10 MG tablet Take 1 tablet (10 mg total) by mouth at bedtime. 90 tablet 3  . oseltamivir (TAMIFLU) 75 MG capsule Take 1 capsule (75 mg total) by mouth daily. 10 capsule 0   No facility-administered medications prior to visit.     ROS See HPI  Objective:  BP (!) 140/92   Pulse 91   Temp 98.6 F (37 C)  (Oral)   Ht 5' (1.524 m)   Wt 206 lb 9.6 oz (93.7 kg)   SpO2 95%   BMI 40.35 kg/m   BP Readings from Last 3 Encounters:  09/05/18 (!) 140/92  08/11/18 128/86  07/24/18 (!) 142/88    Wt Readings from Last 3 Encounters:  09/05/18 206 lb 9.6 oz (93.7 kg)  08/11/18 204 lb 3.2 oz (92.6 kg)  07/24/18 203 lb (92.1 kg)    Physical Exam Vitals signs reviewed.  HENT:     Nose: Congestion and rhinorrhea present.     Mouth/Throat:     Pharynx: Posterior oropharyngeal erythema present. No oropharyngeal exudate.  Cardiovascular:     Rate and Rhythm: Normal rate and regular rhythm.  Pulmonary:     Effort: Pulmonary effort is normal.     Breath sounds: Normal breath sounds.  Neurological:     Mental Status: She is alert and oriented to person, place, and time.    Lab Results  Component Value Date   WBC 8.4 12/11/2007   HGB 8.5 (L) 12/11/2007   HCT 24.9 (L) 12/11/2007   PLT 222 12/11/2007   GLUCOSE 169 (H) 05/07/2018   CHOL 191 08/11/2018   TRIG 91.0 08/11/2018   HDL 75.40 08/11/2018   LDLDIRECT 118.9 07/05/2014   LDLCALC 97 08/11/2018   ALT 16 05/07/2018   AST 16 05/07/2018   NA 136 05/07/2018  K 4.1 05/07/2018   CL 102 05/07/2018   CREATININE 0.67 05/07/2018   BUN 16 05/07/2018   CO2 25 05/07/2018   TSH 0.73 05/07/2018   INR 0.9 12/08/2007   HGBA1C 7.8 (A) 08/11/2018   HGBA1C 7.8 08/11/2018   MICROALBUR 1.0 08/11/2018    Mm Digital Screening Bilateral  Result Date: 07/08/2018 CLINICAL DATA:  Screening. EXAM: DIGITAL SCREENING BILATERAL MAMMOGRAM WITH CAD COMPARISON:  Previous exam(s). ACR Breast Density Category c: The breast tissue is heterogeneously dense, which may obscure small masses. FINDINGS: There are no findings suspicious for malignancy. Images were processed with CAD. IMPRESSION: No mammographic evidence of malignancy. A result letter of this screening mammogram will be mailed directly to the patient. RECOMMENDATION: Screening mammogram in one year.  (Code:[id number]-B-01Y) BI-RADS CATEGORY  1: Negative. Electronically Signed   By: Drew  Davis M.D.   On: 07/08/2018 16:59    Assessment & Plan:   Lynann was seen today for cough.  Diagnoses and all orders for this visit:  Flu-like symptoms -     oseltamivir (TAMIFLU) 75 MG capsule; Take 1 capsule (75 mg total) by mouth 2 (two) times daily. -     POC Influenza A&B(BINAX/QUICKVUE) -     promethazine-dextromethorphan (PROMETHAZINE-DM) 6.25-15 MG/5ML syrup; Take 5 mLs by mouth 3 (three) times daily as needed for cough.   I have changed Lyriq H. Prajapati's oseltamivir. I am also having her start on promethazine-dextromethorphan. Additionally, I am having her maintain her ONE TOUCH ULTRA SYSTEM KIT, glucose blood, Omega-3 Fatty Acids (OMEGA-3 FISH OIL PO), meclizine, simvastatin, Olopatadine HCl, metFORMIN, fluticasone, and amitriptyline.  Meds ordered this encounter  Medications  . oseltamivir (TAMIFLU) 75 MG capsule    Sig: Take 1 capsule (75 mg total) by mouth 2 (two) times daily.    Dispense:  10 capsule    Refill:  0    Order Specific Question:   Supervising Provider    Answer:   ARON, TALIA M [[id number]]  . promethazine-dextromethorphan (PROMETHAZINE-DM) 6.25-15 MG/5ML syrup    Sig: Take 5 mLs by mouth 3 (three) times daily as needed for cough.    Dispense:  120 mL    Refill:  0    Order Specific Question:   Supervising Provider    Answer:   ARON, TALIA M [[id number]]    Problem List Items Addressed This Visit    None    Visit Diagnoses    Flu-like symptoms    -  Primary   Relevant Medications   oseltamivir (TAMIFLU) 75 MG capsule   promethazine-dextromethorphan (PROMETHAZINE-DM) 6.25-15 MG/5ML syrup   Other Relevant Orders   POC Influenza A&B(BINAX/QUICKVUE) (Completed)       Follow-up: Return if symptoms worsen or fail to improve.  Charlotte Nche, NP 

## 2018-09-05 NOTE — Telephone Encounter (Signed)
Pt is aware.  

## 2018-09-18 LAB — HM DIABETES EYE EXAM

## 2018-09-19 ENCOUNTER — Telehealth: Payer: Self-pay | Admitting: Family Medicine

## 2018-09-19 NOTE — Telephone Encounter (Signed)
Copied from [place] [id number]. Topic: Quick Communication - See Telephone Encounter >> Sep 19, 2018  4:29 PM Linda [name] wrote: CRM for notification. See Telephone encounter for: 09/19/18.  Patient said she went to her eye doctor yesterday and he noticed that her blood pressure was around 150/90 and today she checked it and it was 143/97. She wanted to know if Dr Deborra Medina received the fax from that office and what her next steps are because she is a little concerned

## 2018-09-26 ENCOUNTER — Encounter: Payer: Self-pay | Admitting: Family Medicine

## 2018-09-26 NOTE — Progress Notes (Signed)
[facility]/thx dmf 

## 2018-10-03 ENCOUNTER — Ambulatory Visit: Payer: 59 | Admitting: Neurology

## 2018-11-10 ENCOUNTER — Ambulatory Visit: Payer: 59 | Admitting: Family Medicine

## 2018-11-24 ENCOUNTER — Telehealth: Payer: Self-pay | Admitting: Family Medicine

## 2018-11-24 NOTE — Telephone Encounter (Signed)
Tried to call multiple times to see if patient wanted to reschedule appt with dr Deborra Medina for virtual visit, left message.

## 2018-12-05 ENCOUNTER — Encounter: Payer: Self-pay | Admitting: Gastroenterology

## 2019-02-27 ENCOUNTER — Ambulatory Visit: Payer: 59 | Admitting: Neurology

## 2019-03-20 DIAGNOSIS — K635 Polyp of colon: Secondary | ICD-10-CM | POA: Insufficient documentation
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
## 2019-03-24 ENCOUNTER — Other Ambulatory Visit (INDEPENDENT_AMBULATORY_CARE_PROVIDER_SITE_OTHER): Payer: 59

## 2019-03-24 ENCOUNTER — Ambulatory Visit (INDEPENDENT_AMBULATORY_CARE_PROVIDER_SITE_OTHER): Payer: 59 | Admitting: Family Medicine

## 2019-03-24 ENCOUNTER — Encounter: Payer: Self-pay | Admitting: Family Medicine

## 2019-03-24 VITALS — BP 132/82 | Wt 199.0 lb

## 2019-03-24 DIAGNOSIS — D122 Benign neoplasm of ascending colon: Secondary | ICD-10-CM

## 2019-03-24 DIAGNOSIS — E785 Hyperlipidemia, unspecified: Secondary | ICD-10-CM

## 2019-03-24 DIAGNOSIS — G43109 Migraine with aura, not intractable, without status migrainosus: Secondary | ICD-10-CM

## 2019-03-24 DIAGNOSIS — K635 Polyp of colon: Secondary | ICD-10-CM

## 2019-03-24 DIAGNOSIS — E119 Type 2 diabetes mellitus without complications: Secondary | ICD-10-CM

## 2019-03-24 LAB — CBC
HCT: 37.5 % (ref 36.0–46.0)
Hemoglobin: 12.2 g/dL (ref 12.0–15.0)
MCHC: 32.6 g/dL (ref 30.0–36.0)
MCV: 81.5 fl (ref 78.0–100.0)
Platelets: 302 10*3/uL (ref 150.0–400.0)
RBC: 4.6 Mil/uL (ref 3.87–5.11)
RDW: 14.1 % (ref 11.5–15.5)
WBC: 7.1 10*3/uL (ref 4.0–10.5)

## 2019-03-24 LAB — COMPREHENSIVE METABOLIC PANEL
ALT: 17 U/L (ref 0–35)
AST: 15 U/L (ref 0–37)
Albumin: 4.2 g/dL (ref 3.5–5.2)
Alkaline Phosphatase: 117 U/L (ref 39–117)
BUN: 17 mg/dL (ref 6–23)
CO2: 28 mEq/L (ref 19–32)
Calcium: 9.4 mg/dL (ref 8.4–10.5)
Chloride: 103 mEq/L (ref 96–112)
Creatinine, Ser: 0.76 mg/dL (ref 0.40–1.20)
GFR: 95.24 mL/min (ref >60.00)
Glucose, Bld: 195 mg/dL — ABNORMAL HIGH (ref 70–99)
Potassium: 5.1 mEq/L (ref 3.5–5.1)
Sodium: 138 mEq/L (ref 135–145)
Total Bilirubin: 0.5 mg/dL (ref 0.2–1.2)
Total Protein: 7.3 g/dL (ref 6.0–8.3)

## 2019-03-24 LAB — HEMOGLOBIN A1C: Hgb A1c MFr Bld: 8.8 % — ABNORMAL HIGH (ref 4.6–6.5)

## 2019-03-24 LAB — LIPID PANEL
Cholesterol: 219 mg/dL — ABNORMAL HIGH (ref 0–200)
HDL: 63.4 mg/dL (ref >39.00)
LDL Cholesterol: 139 mg/dL — ABNORMAL HIGH (ref 0–99)
NonHDL: 155.53
Total CHOL/HDL Ratio: 3
Triglycerides: 83 mg/dL (ref 0.0–149.0)
VLDL: 16.6 mg/dL (ref 0.0–40.0)

## 2019-03-24 LAB — TSH: TSH: 1.04 u[IU]/mL (ref 0.35–4.50)

## 2019-03-24 NOTE — Assessment & Plan Note (Signed)
Now only requiring elavil 4 times per week or so.  Having less episodes.  Followed at [facility] for this.

## 2019-03-24 NOTE — Assessment & Plan Note (Signed)
She feels like she is doing well but has not been checking her FSBS.  Urine micro UTD. On statin. Due for labs, she will come in today at 11:15.  Orders Placed This Encounter  Procedures  . Hemoglobin A1c  . Lipid panel  . Comprehensive metabolic panel  . TSH  . CBC

## 2019-03-24 NOTE — Assessment & Plan Note (Signed)
She just spoke with GI and asked if she could wait a month or two before coming in due to the pandemic.

## 2019-03-24 NOTE — Assessment & Plan Note (Signed)
Remains compliant with statin. Check labs today.

## 2019-03-26 ENCOUNTER — Encounter: Payer: Self-pay | Admitting: Family Medicine

## 2019-03-26 ENCOUNTER — Other Ambulatory Visit: Payer: Self-pay

## 2019-03-26 MED ORDER — ONETOUCH DELICA LANCETS 33G MISC: 12 refills | Status: DC

## 2019-03-26 MED ORDER — METFORMIN HCL ER 500 MG PO TB24: ORAL_TABLET | ORAL | 1 refills | Status: DC

## 2019-03-26 MED ORDER — GLUCOSE BLOOD VI STRP: ORAL_STRIP | 12 refills | Status: AC

## 2019-05-05 ENCOUNTER — Ambulatory Visit: Payer: Self-pay | Admitting: *Deleted

## 2019-05-05 ENCOUNTER — Other Ambulatory Visit: Payer: Self-pay

## 2019-05-05 ENCOUNTER — Emergency Department (HOSPITAL_COMMUNITY)
Admission: EM | Admit: 2019-05-05 | Discharge: 2019-05-05 | Disposition: A | Discharge status: Home / Self Care | Payer: 59 | Attending: Emergency Medicine | Admitting: Emergency Medicine

## 2019-05-05 ENCOUNTER — Encounter (HOSPITAL_COMMUNITY): Payer: Self-pay | Admitting: Emergency Medicine

## 2019-05-05 DIAGNOSIS — Z79899 Other long term (current) drug therapy: Secondary | ICD-10-CM | POA: Diagnosis not present

## 2019-05-05 DIAGNOSIS — R5383 Other fatigue: Secondary | ICD-10-CM | POA: Diagnosis present

## 2019-05-05 DIAGNOSIS — R030 Elevated blood-pressure reading, without diagnosis of hypertension: Secondary | ICD-10-CM | POA: Diagnosis not present

## 2019-05-05 DIAGNOSIS — Z7984 Long term (current) use of oral hypoglycemic drugs: Secondary | ICD-10-CM | POA: Diagnosis not present

## 2019-05-05 DIAGNOSIS — E119 Type 2 diabetes mellitus without complications: Secondary | ICD-10-CM | POA: Insufficient documentation

## 2019-05-05 LAB — BASIC METABOLIC PANEL
Anion gap: 10 (ref 5–15)
BUN: 14 mg/dL (ref 6–20)
CO2: 23 mmol/L (ref 22–32)
Calcium: 8.5 mg/dL — ABNORMAL LOW (ref 8.9–10.3)
Chloride: 104 mmol/L (ref 98–111)
Creatinine, Ser: 0.59 mg/dL (ref 0.44–1.00)
GFR calc Af Amer: >60 mL/min (ref >60)
GFR calc non Af Amer: >60 mL/min (ref >60)
Glucose, Bld: 138 mg/dL — ABNORMAL HIGH (ref 70–99)
Potassium: 3.6 mmol/L (ref 3.5–5.1)
Sodium: 137 mmol/L (ref 135–145)

## 2019-05-05 NOTE — Telephone Encounter (Signed)
Patient is calling with concerns of high BP- patient has been monitoring her BP this morning and her reading are 173/105 and 169/106. Patient states she has "tightness in her head and nausea.  Advised ED for BP evaluation and call office for follow up.  Reason for Disposition . [id number] Systolic BP  >= 0000000 OR Diastolic >= [id number] AND [id number] cardiac or neurologic symptoms (e.g., chest pain, difficulty breathing, unsteady gait, blurred vision)  Answer Assessment - Initial Assessment Questions 1. BLOOD PRESSURE: "What is the blood pressure?" "Did you take at least two measurements 5 minutes apart?"     173/105, 130/97 P 72 132/104 P 65 2. ONSET: "When did you take your blood pressure?"     Today-11:30, 12:00 3. HOW: "How did you obtain the blood pressure?" (e.g., visiting nurse, automatic home BP monitor)     Automatic cuff 4. HISTORY: "Do you have a history of high blood pressure?"     Suspected high BP in the past 5. MEDICATIONS: "Are you taking any medications for blood pressure?" "Have you missed any doses recently?"     no 6. OTHER SYMPTOMS: "Do you have any symptoms?" (e.g., headache, chest pain, blurred vision, difficulty breathing, weakness)     Nausea, frequent HA-tightness in forehead 7. PREGNANCY: "Is there any chance you are pregnant?" "When was your last menstrual period?"     n/a  Protocols used: HIGH BLOOD PRESSURE-A-AH

## 2019-05-05 NOTE — Discharge Instructions (Addendum)
Your blood work, Copy and your exam are normal and reassuring today. Initially your blood pressure was elevated (155/81), but the last 2 checks here have been good with the last measurement at 124/65.  I would not be inclined to start you on any medications for blood pressure at this time but you should have a blood pressure recheck within the next week.  Call your doctor for an office visit for this.

## 2019-05-05 NOTE — ED Triage Notes (Signed)
Pt states she was sent over by PCP for hypertension and nausea. Pt states she had a headache, but that had resolved now. Pt states her last BP was 140/105 at home. No known hx of HTN. States she took her BP at home because she was not feeling well.
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
## 2019-05-06 ENCOUNTER — Ambulatory Visit: Payer: Self-pay | Admitting: *Deleted

## 2019-05-06 NOTE — Telephone Encounter (Signed)
Message from Linda [name] sent at 05/06/2019 4:50 PM EDT  Summary: head pain/ high BP    Patient was triaged yesterday. Patient would like to speak to nurse again. Patient as of right now is just having head pain. Patient did go to ER today.  BP a couple hours ago was 143/106  Call back [phone]          Returned call to pt who states that she was calling to follow up due to her elevated BP.  Yesterday morning BP was 173/105. Pt did go the the ED and states that by the time she left BP 124/65 when she left.Pt states that she was not treated with any BP lowering medications during her visit to the ED. This morning was 140/104  And this afternoon BP was 143/106.  While on the phone with the triage nurse BP was 144/105 and HR 92. Pt states she does not take any BP medications and does not have a history of HTN. Pt denies any headaches, blurry vision, chest pain or difficulty breathing at this time. Pt states her BP usually ranges from the 120s-130s/80s. Pt states that 3 years ago she was diagnosed with silent migraines and has recently experienced migraines that caused tightness and pain in her head over her forehead. Pt has an appt scheduled on 05/11/19 but would like to know if it possible to be seen sooner. Pt can be contacted at [phone] Reason for Disposition . [id number] Systolic BP  >= [id number] OR Diastolic >= 80 AND A999333 not taking BP medications  Answer Assessment - Initial Assessment Questions 1. BLOOD PRESSURE: "What is the blood pressure?" "Did you take at least two measurements 5 minutes apart?"     144/105 and HR-92 2. ONSET: "When did you take your blood pressure?"     This morning and afternoon and while on the phone with the triage nuse 3. HOW: "How did you obtain the blood pressure?" (e.g., visiting nurse, automatic home BP monitor)    Automatic home monitor 4. HISTORY: "Do you have a history of high blood pressure?"     No 5. MEDICATIONS: "Are you taking any medications  for blood pressure?" "Have you missed any doses recently?"     No BP meds currently 6. OTHER SYMPTOMS: "Do you have any symptoms?" (e.g., headache, chest pain, blurred vision, difficulty breathing, weakness)     No headaches right now, but may last for 2 or 3 secs and go away. Denies other symptoms 7. PREGNANCY: "Is there any chance you are pregnant?" "When was your last menstrual period?"     n/a  Protocols used: HIGH BLOOD PRESSURE-A-AH

## 2019-05-07 NOTE — Telephone Encounter (Signed)
I left a message for pt letting her know that Dr. Deborra Medina won't be back in the office until her appt on Monday, but that we can schedule her with a different provider if she would like to be seen sooner.

## 2019-05-08 NOTE — Telephone Encounter (Signed)
I don't work on Fridays so I am seeing her as soon as possible.  Does she mean another provider/?  Today?

## 2019-05-08 NOTE — Telephone Encounter (Signed)
Ok thanks 

## 2019-05-08 NOTE — Telephone Encounter (Signed)
I left her a message yesterday to get her appointment moved up sooner if she wanted with someone else that was here, but she hasn't returned my call at this time. She's still scheduled to see you Monday.

## 2019-05-08 NOTE — Telephone Encounter (Signed)
Pt was seen at the ER, and I left her a voicemail to move her appointment up if she would like. As of now, she's scheduled to see you Monday at 3pm.

## 2019-05-11 ENCOUNTER — Other Ambulatory Visit: Payer: Self-pay

## 2019-05-11 ENCOUNTER — Encounter: Payer: Self-pay | Admitting: Family Medicine

## 2019-05-11 ENCOUNTER — Ambulatory Visit (INDEPENDENT_AMBULATORY_CARE_PROVIDER_SITE_OTHER): Payer: 59 | Admitting: Family Medicine

## 2019-05-11 VITALS — BP 124/92 | HR 93 | Temp 98.2°F | Wt 202.2 lb

## 2019-05-11 DIAGNOSIS — E119 Type 2 diabetes mellitus without complications: Secondary | ICD-10-CM | POA: Diagnosis not present

## 2019-05-11 DIAGNOSIS — Z09 Encounter for follow-up examination after completed treatment for conditions other than malignant neoplasm: Secondary | ICD-10-CM

## 2019-05-11 DIAGNOSIS — R030 Elevated blood-pressure reading, without diagnosis of hypertension: Secondary | ICD-10-CM

## 2019-05-11 NOTE — Patient Instructions (Addendum)
Renal Artery Stenosis Renal artery stenosis (RAS) is a narrowing of the artery that carries blood to the kidneys. It can affect one or both kidneys. The kidneys filter waste and extra fluid from the blood. Waste and fluid are then removed when a person passes urine. The kidneys also make an important chemical messenger (hormone) called renin. Renin helps regulate blood pressure. The first sign of RAS may be high blood pressure. Other symptoms can develop over time. What are the causes? A common cause of this condition is plaque buildup in your arteries (atherosclerosis). The plaques that cause this are made up of:  Fat.  Cholesterol.  Calcium.  Other substances. As these substances build up in your renal artery, the blood supply to your kidneys slows. The lack of blood and oxygen causes the signs and symptoms of RAS. A much less common cause of RAS is a disease called fibromuscular dysplasia. This disease causes abnormal cell growth that narrows the renal artery. It is not related to atherosclerosis. It occurs mostly in women who are 25-50 years old. It may be passed down through families.  What increases the risk? You are more likely to develop this condition if you:  Are a man who is at least 56 years old.  Are a woman who is at least 55 years old.  Have high blood pressure.  Have high cholesterol.  Are a smoker.  Abuse alcohol.  Have diabetes or prediabetes.  Are overweight or obese.  Have a family history of early heart disease. What are the signs or symptoms? RAS usually develops slowly. You may not have any signs or symptoms at first. Early signs may include:  Development of high blood pressure.  A sudden increase in existing high blood pressure.  No longer responding to medicine that used to control your blood pressure. Later signs and symptoms are due to kidney damage. They may include:  Feeling tired (fatigue).  Shortness of breath.  Swollen legs and feet.   Dry skin.  Headaches.  Muscle cramps.  Loss of appetite.  Nausea or vomiting. How is this diagnosed? This condition may be diagnosed based on:  Your symptoms and medical history. Your health care provider may suspect RAS based on changes in your blood pressure and your risk factors.  A physical exam. During the exam, your health care provider will use a stethoscope to listen for a whooshing sound (bruit) that can occur where the renal artery is blocking blood flow.  Various tests. These may include: ? Blood and urine tests to check your kidney function. ? Imaging tests of your kidneys, such as:  A test that uses sound waves to create an image of your kidneys and the blood flow to your kidneys (ultrasound).  A test in which dye is injected into one of your blood vessels so images can be taken as the dye flows through your renal arteries (angiogram). This can be done using X-rays, a CT scan (computed tomography angiogram, CTA), or a type of MRI (magnetic resonance angiogram, MRA). How is this treated? Making lifestyle changes to reduce your risk factors is the first treatment option for early RAS. If the blood flow to one of your kidneys is cut by more than half, you may need medicine to:  Lower your blood pressure. This is the main medical treatment for RAS. You may need more than one type of medicine for this. The types that work best for people with RAS are: ? ACE inhibitors. ? Angiotensin receptor   blockers.  Reduce fluid in the body (diuretics).  Lower your cholesterol (statins). If medicine is not enough to control RAS, you may need surgery. This may involve:  Threading a tube with an inflatable balloon into the renal artery to force it to open (angioplasty).  Removing plaque from inside the artery (endarterectomy). Follow these instructions at home:  Lifestyle  Make any lifestyle changes recommended by your health care provider. This may include: ? Working with a  dietitian to maintain a heart-healthy diet. This type of diet is low in saturated fat, salt, and added sugar. ? Starting an exercise program as directed by your health care provider. ? Maintaining a healthy weight. ? Quitting smoking. ? Not abusing alcohol. General instructions  Take over-the-counter and prescription medicines only as told by your health care provider.  Keep all follow-up visits as told by your health care provider. This is important. Contact a health care provider if:  Your symptoms of RAS are not getting better.  Your symptoms are changing or getting worse. Get help right away if you have:  Very bad pain in your back or abdomen.  Blood in your urine. Summary  Renal artery stenosis (RAS) is a narrowing of the artery that carries blood to the kidneys. It can affect one or both kidneys.  RAS usually develops slowly. You may not have any signs or symptoms at first, but high blood pressure that is difficult to control is a key symptom.  Making lifestyle changes to reduce your risk factors is the first treatment option for early RAS. If the blood flow to one of your kidneys is cut by more than half, you may need medicines to help manage your cholesterol and blood pressure. This information is not intended to replace advice given to you by your health care provider. Make sure you discuss any questions you have with your health care provider. Document Released: 04/25/2005 Document Revised: 08/26/2017 Document Reviewed: 08/26/2017 Elsevier Patient Education  2020 [employer]  

## 2019-05-11 NOTE — Assessment & Plan Note (Signed)
>  40  minutes spent in face to face time with patient, >50% spent in counselling or coordination of care- reviewing ED notes, EKG, labs, blood pressure, diabetes and symptoms. ? Renal artery stenosis- will order [place]. Advised to keep me updated. Referral placed to diabetic nutritionist. She is on a statin.  Orders Placed This Encounter  Procedures  . US Renal Artery Stenosis  . Referral to Nutrition and Diabetes Services
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
## 2019-05-25 ENCOUNTER — Encounter: Payer: Self-pay | Admitting: Family Medicine

## 2019-05-29 ENCOUNTER — Other Ambulatory Visit: Payer: 59

## 2019-06-01 ENCOUNTER — Ambulatory Visit
Admission: RE | Admit: 2019-06-01 | Discharge: 2019-06-01 | Disposition: A | Discharge status: Home / Self Care | Payer: 59 | Source: Ambulatory Visit | Attending: Family Medicine | Admitting: Family Medicine

## 2019-06-01 DIAGNOSIS — R030 Elevated blood-pressure reading, without diagnosis of hypertension: Secondary | ICD-10-CM

## 2019-06-07 ENCOUNTER — Encounter: Payer: Self-pay | Admitting: Family Medicine

## 2019-06-08 ENCOUNTER — Other Ambulatory Visit: Payer: Self-pay

## 2019-06-09 ENCOUNTER — Encounter: Payer: Self-pay | Admitting: Family Medicine

## 2019-06-09 ENCOUNTER — Ambulatory Visit (INDEPENDENT_AMBULATORY_CARE_PROVIDER_SITE_OTHER): Payer: 59 | Admitting: Family Medicine

## 2019-06-09 VITALS — BP 140/98 | HR 74 | Temp 98.7°F | Ht 60.0 in | Wt 204.2 lb

## 2019-06-09 DIAGNOSIS — M62838 Other muscle spasm: Secondary | ICD-10-CM | POA: Diagnosis not present

## 2019-06-09 DIAGNOSIS — M5481 Occipital neuralgia: Secondary | ICD-10-CM

## 2019-06-09 MED ORDER — SIMVASTATIN 10 MG PO TABS: 10.0000 mg | ORAL_TABLET | Freq: Every day | ORAL | 3 refills | Status: DC

## 2019-06-09 MED ORDER — CYCLOBENZAPRINE HCL 5 MG PO TABS: 5.0000 mg | ORAL_TABLET | Freq: Every day | ORAL | 1 refills | Status: DC

## 2019-06-09 NOTE — Patient Instructions (Signed)
Occipital Neuralgia  Occipital neuralgia is a type of headache that causes brief episodes of very bad pain in the back of your head. Pain from occipital neuralgia may spread (radiate) to other parts of your head. These headaches may be caused by irritation of the nerves that leave your spinal cord high up in your neck, just below the base of your skull (occipital nerves). Your occipital nerves transmit sensations from the back of your head, the top of your head, and the areas behind your ears. What are the causes? This condition can occur without any known cause (primary headache syndrome). In other cases, this condition is caused by pressure on or irritation of one of the two occipital nerves. Pressure and irritation may be due to:  Muscle spasm in the neck.  Neck injury.  Wear and tear of the vertebrae in the neck (osteoarthritis).  Disease of the disks that separate the vertebrae.  Swollen blood vessels that put pressure on the occipital nerves.  Infections.  Tumors.  Diabetes. What are the signs or symptoms? This condition causes brief burning, stabbing, electric, shocking, or shooting pain which can radiate to the top of the head. It can happen on one side or both sides of the head. It can also cause:  Pain behind the eye.  Pain triggered by neck movement or hair brushing.  Scalp tenderness.  Aching in the back of the head between episodes of very bad pain.  Pain gets worse with exposure to bright lights. How is this diagnosed? There is no test that diagnoses this condition. Your health care provider may diagnose this condition based on a physical exam and your symptoms. Other tests may be done, such as:  Imaging studies of the brain and neck (cervical spine), such as an MRI or CT scan. These look for causes of pinched nerves.  Applying pressure to the nerves in the neck to try to re-create the pain.  Injection of numbing medicine into the occipital nerve areas to see if  pain goes away (diagnostic nerve block). How is this treated? Treatment for this condition may begin with simple measures, such as:  Rest.  Massage.  Applying heat or cold on the area.  Over-the-counter pain relievers. If these measures do not work, you may need other treatments, including:  Medicines, such as: ? Prescription-strength anti-inflammatory medicines. ? Muscle relaxants. ? Anti-seizure medicines, which can relieve pain. ? Antidepressants, which can relieve pain. ? Injected medicines, such as medicines that numb the area (local anesthetic) and steroids.  Pulsed radiofrequency ablation. This is when wires are implanted to deliver electrical impulses that block pain signals from the occipital nerve.  Surgery to relieve nerve pressure.  Physical therapy. Follow these instructions at home: Pain management      Avoid any activities that cause pain.  Rest when you have an attack of pain.  Try gentle massage to relieve pain.  Try a different pillow or sleeping position.  If directed, apply heat to the affected area as told by your health care provider. Use the heat source that your health care provider recommends, such as a moist heat pack or a heating pad. ? Place a towel between your skin and the heat source. ? Leave the heat on for 20-30 minutes. ? Remove the heat if your skin turns bright red. This is especially important if you are unable to feel pain, heat, or cold. You may have a greater risk of getting burned.  If directed, apply ice to the   back of the head and neck area as told by your health care provider. ? Put ice in a plastic bag. ? Place a towel between your skin and the bag. ? Leave the ice on for 20 minutes, 2-3 times per day. General instructions  Take over-the-counter and prescription medicines only as told by your health care provider.  Avoid things that make your symptoms worse, such as bright lights.  Try to stay active. Get regular  exercise that does not cause pain. Ask your health care provider to suggest safe exercises for you.  Work with a physical therapist to learn stretching exercises you can do at home.  Practice good posture.  Keep all follow-up visits as told by your health care provider. This is important. Contact a health care provider if:  Your medicine is not working.  You have new or worsening symptoms. Get help right away if:  You have very bad head pain that does not go away.  You have a sudden change in vision, balance, or speech. Summary  Occipital neuralgia is a type of headache that causes brief episodes of very bad pain in the back of your head.  Pain from occipital neuralgia may spread (radiate) to other parts of your head.  Treatment for this condition includes rest, massage, and medicines. This information is not intended to replace advice given to you by your health care provider. Make sure you discuss any questions you have with your health care provider. Document Released: 07/24/2001 Document Revised: 07/16/2017 Document Reviewed: 10/04/2016 Elsevier Patient Education  2020 [employer].
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
## 2019-06-23 ENCOUNTER — Other Ambulatory Visit: Payer: Self-pay

## 2019-06-23 ENCOUNTER — Encounter: Payer: 59 | Attending: Family Medicine | Admitting: *Deleted

## 2019-07-22 ENCOUNTER — Other Ambulatory Visit: Payer: Self-pay | Admitting: Obstetrics & Gynecology

## 2019-07-22 ENCOUNTER — Encounter: Payer: Self-pay | Admitting: Gastroenterology

## 2019-07-22 DIAGNOSIS — Z1231 Encounter for screening mammogram for malignant neoplasm of breast: Secondary | ICD-10-CM

## 2019-08-05 ENCOUNTER — Encounter: Payer: Self-pay | Admitting: Family Medicine

## 2019-08-06 ENCOUNTER — Encounter: Payer: Self-pay | Admitting: Family Medicine

## 2019-08-06 ENCOUNTER — Telehealth (INDEPENDENT_AMBULATORY_CARE_PROVIDER_SITE_OTHER): Payer: Managed Care, Other (non HMO) | Admitting: Family Medicine

## 2019-08-06 DIAGNOSIS — K219 Gastro-esophageal reflux disease without esophagitis: Secondary | ICD-10-CM

## 2019-08-06 DIAGNOSIS — M62838 Other muscle spasm: Secondary | ICD-10-CM | POA: Insufficient documentation

## 2019-08-06 MED ORDER — METAXALONE 800 MG PO TABS: 800.0000 mg | ORAL_TABLET | Freq: Every evening | ORAL | 3 refills | Status: DC | PRN

## 2019-08-06 MED ORDER — PANTOPRAZOLE SODIUM 40 MG PO TBEC: 40.0000 mg | DELAYED_RELEASE_TABLET | Freq: Every day | ORAL | 3 refills | Status: DC

## 2019-08-06 MED ORDER — PREDNISONE 10 MG PO TABS: ORAL_TABLET | ORAL | 0 refills | Status: DC

## 2019-08-06 NOTE — Assessment & Plan Note (Signed)
History: Gastrointestinal ROS: no abdominal pain, change in bowel habits, or black or bloody stools positive for - gas/bloating and heartburn. Medication compliance: taking NSAIDS on an empty stomach, eating greasy foods. Assessment/Plan: We reviewed the diagnosis of GERD and the reasons why it was important to treat this. We discussed "red flag" symptoms and the importance of follow up if symptoms persisted despite treatment. We reviewed non-pharmacologic management of GERD symptoms: including: caffeine reduction, dietary changes, elevate HOB, NPO after supper, reduction of alcohol intake, tobacco cessation, and weight loss.  eRx sent for protonix to pharmacy on file.  Call or send my chart message prn if these symptoms worsen or fail to improve as anticipated. The patient indicates understanding of these issues and agrees with the plan.
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
## 2019-08-06 NOTE — Assessment & Plan Note (Signed)
SUBJECTIVE:  Linda [name] is a 58 y.o. female who complains of neck pain for  3 month(s) ago. The pain is positional with movement of neck without radiation of pain down the arms. No known injury Symptoms have been intermittent since that time. Prior history of neck problems: no prior neck problems. There is no numbness, tingling, weakness in the arms.  Taking Advil on an empty stomach, heat an doing exercises.  Pain persists    ASSESSMENT:  cervical strain and underlying chronic DDD likely  PLAN: rest the injured area as much as practical, d/c flexeril. eRx sent for prednisone to be taking int he morning (and with food) and skelaxin at bedtime.  D/c flexeril. Consider Physical Therapy and XRay studies if not improving.  Call or return to clinic prn if these symptoms worsen or fail to improve as anticipated.  The patient indicates understanding of these issues and agrees with the plan.

## 2019-08-06 NOTE — Patient Instructions (Signed)

## 2019-08-09 ENCOUNTER — Other Ambulatory Visit: Payer: Self-pay | Admitting: Neurology

## 2019-08-09 DIAGNOSIS — G43109 Migraine with aura, not intractable, without status migrainosus: Secondary | ICD-10-CM

## 2019-08-11 ENCOUNTER — Other Ambulatory Visit: Payer: Self-pay

## 2019-08-11 ENCOUNTER — Ambulatory Visit (AMBULATORY_SURGERY_CENTER): Payer: Managed Care, Other (non HMO) | Admitting: *Deleted

## 2019-08-11 VITALS — Temp 96.5°F | Ht 60.0 in | Wt 203.6 lb

## 2019-08-11 DIAGNOSIS — Z1159 Encounter for screening for other viral diseases: Secondary | ICD-10-CM

## 2019-08-11 DIAGNOSIS — Z1211 Encounter for screening for malignant neoplasm of colon: Secondary | ICD-10-CM

## 2019-08-11 MED ORDER — SUPREP BOWEL PREP KIT 17.5-3.13-1.6 GM/177ML PO SOLN: ORAL | 0 refills | Status: DC

## 2019-08-11 NOTE — Progress Notes (Signed)
Pt is aware that care partner will wait in the car during procedure; if they feel like they will be too hot or cold to wait in the car; they may wait in the 4 th floor lobby. Patient is aware to bring only one care partner. We want them to wear a mask (we do not have any that we can provide them), practice social distancing, and we will check their temperatures when they get here.  I did remind the patient that their care partner needs to stay in the parking lot the entire time and have a cell phone available, we will call them when the pt is ready for discharge. Patient will wear mask into building.  covid test 08-25-19 at 3:25 pm  No egg or soy allergy  No home oxygen use or problems with anesthesia  No medications for weight loss taken  emmi information given  $15 off Suprep coupon given

## 2019-08-12 DIAGNOSIS — Z6839 Body mass index (BMI) 39.0-39.9, adult: Secondary | ICD-10-CM | POA: Diagnosis not present

## 2019-08-12 DIAGNOSIS — Z01419 Encounter for gynecological examination (general) (routine) without abnormal findings: Secondary | ICD-10-CM | POA: Diagnosis not present

## 2019-08-13 LAB — HM PAP SMEAR: HM Pap smear: NEGATIVE

## 2019-08-18 ENCOUNTER — Encounter: Payer: Self-pay | Admitting: Gastroenterology

## 2019-08-25 ENCOUNTER — Ambulatory Visit (INDEPENDENT_AMBULATORY_CARE_PROVIDER_SITE_OTHER): Payer: BC Managed Care – PPO

## 2019-08-25 ENCOUNTER — Other Ambulatory Visit: Payer: Self-pay | Admitting: Gastroenterology

## 2019-08-25 DIAGNOSIS — Z1159 Encounter for screening for other viral diseases: Secondary | ICD-10-CM | POA: Diagnosis not present

## 2019-08-26 LAB — SARS CORONAVIRUS 2 (TAT 6-24 HRS): SARS Coronavirus 2: NEGATIVE

## 2019-08-28 ENCOUNTER — Ambulatory Visit (AMBULATORY_SURGERY_CENTER): Payer: BC Managed Care – PPO | Admitting: Gastroenterology

## 2019-08-28 ENCOUNTER — Encounter: Payer: Self-pay | Admitting: Gastroenterology

## 2019-08-28 ENCOUNTER — Other Ambulatory Visit: Payer: Self-pay

## 2019-08-28 VITALS — BP 154/93 | HR 75 | Temp 97.8°F | Resp 17 | Ht 60.0 in | Wt 203.6 lb

## 2019-08-28 DIAGNOSIS — Z1211 Encounter for screening for malignant neoplasm of colon: Secondary | ICD-10-CM

## 2019-08-28 DIAGNOSIS — D122 Benign neoplasm of ascending colon: Secondary | ICD-10-CM | POA: Diagnosis not present

## 2019-08-28 MED ORDER — SODIUM CHLORIDE 0.9 % IV SOLN: 500.0000 mL | Freq: Once | INTRAVENOUS | Status: DC

## 2019-08-28 NOTE — Patient Instructions (Signed)
Handout given on polyps.  YOU HAD AN ENDOSCOPIC PROCEDURE TODAY AT [facility]:   Refer to the procedure report that was given to you for any specific questions about what was found during the examination.  If the procedure report does not answer your questions, please call your gastroenterologist to clarify.  If you requested that your care partner not be given the details of your procedure findings, then the procedure report has been included in a sealed envelope for you to review at your convenience later.  YOU SHOULD EXPECT: Some feelings of bloating in the abdomen. Passage of more gas than usual.  Walking can help get rid of the air that was put into your GI tract during the procedure and reduce the bloating. If you had a lower endoscopy (such as a colonoscopy or flexible sigmoidoscopy) you may notice spotting of blood in your stool or on the toilet paper. If you underwent a bowel prep for your procedure, you may not have a normal bowel movement for a few days.  Please Note:  You might notice some irritation and congestion in your nose or some drainage.  This is from the oxygen used during your procedure.  There is no need for concern and it should clear up in a day or so.  SYMPTOMS TO REPORT IMMEDIATELY:   Following lower endoscopy (colonoscopy or flexible sigmoidoscopy):  Excessive amounts of blood in the stool  Significant tenderness or worsening of abdominal pains  Swelling of the abdomen that is new, acute  Fever of 100F or higher   For urgent or emergent issues, a gastroenterologist can be reached at any hour by calling [phone].   DIET:  We do recommend a small meal at first, but then you may proceed to your regular diet.  Drink plenty of fluids but you should avoid alcoholic beverages for 24 hours.  ACTIVITY:  You should plan to take it easy for the rest of today and you should NOT DRIVE or use heavy machinery until tomorrow (because of the sedation  medicines used during the test).    FOLLOW UP: Our staff will call the number listed on your records 48-72 hours following your procedure to check on you and address any questions or concerns that you may have regarding the information given to you following your procedure. If we do not reach you, we will leave a message.  We will attempt to reach you two times.  During this call, we will ask if you have developed any symptoms of COVID 19. If you develop any symptoms (ie: fever, flu-like symptoms, shortness of breath, cough etc.) before then, please call [phone].  If you test positive for Covid 19 in the 2 weeks post procedure, please call and report this information to [place].    If any biopsies were taken you will be contacted by phone or by letter within the next 1-3 weeks.  Please call us at [phone] if you have not heard about the biopsies in 3 weeks.    SIGNATURES/CONFIDENTIALITY: You and/or your care partner have signed paperwork which will be entered into your electronic medical record.  These signatures attest to the fact that that the information above on your After Visit Summary has been reviewed and is understood.  Full responsibility of the confidentiality of this discharge information lies with you and/or your care-partner.

## 2019-08-28 NOTE — Op Note (Signed)
[facility] Patient Name: Linda [name] Procedure Date: 08/28/2019 8:26 AM MRN: [id number] Endoscopist: Thornton Park MD, MD Age: 59 Referring MD:  Date of Birth: 03-24-1961 Gender: Female Account #: [id number] Procedure:                Colonoscopy Indications:              Screening for colorectal malignant neoplasm                           Colonoscopy with Dr. Deatra Ina in 2015 identified a                            small ascending colon polyp but he was unable to                            find it again for removal. Repeat exam recommended                            in 5 years. Medicines:                Monitored Anesthesia Care Procedure:                Pre-Anesthesia Assessment:                           - Prior to the procedure, a History and Physical                            was performed, and patient medications and                            allergies were reviewed. The patient's tolerance of                            previous anesthesia was also reviewed. The risks                            and benefits of the procedure and the sedation                            options and risks were discussed with the patient.                            All questions were answered, and informed consent                            was obtained. Prior Anticoagulants: The patient has                            taken no previous anticoagulant or antiplatelet                            agents. ASA Grade Assessment: II - A patient with  mild systemic disease. After reviewing the risks                            and benefits, the patient was deemed in                            satisfactory condition to undergo the procedure.                           After obtaining informed consent, the colonoscope                            was passed under direct vision. Throughout the                            procedure, the patient's blood pressure, pulse, and                           oxygen saturations were monitored continuously. The                            Colonoscope was introduced through the anus and                            advanced to the 3 cm into the ileum. A second                            forward view of the right colon was performed. The                            colonoscopy was performed without difficulty. The                            patient tolerated the procedure well. The quality                            of the bowel preparation was excellent. The                            terminal ileum, ileocecal valve, appendiceal                            orifice, and rectum were photographed. Scope In: 8:33:55 AM Scope Out: 8:49:08 AM Scope Withdrawal Time: 0 hours 12 minutes 11 seconds  Total Procedure Duration: 0 hours 15 minutes 13 seconds  Findings:                 The perianal and digital rectal examinations were                            normal.                           A 3 mm polyp was found in the proximal ascending  colon. The polyp was sessile. The polyp was removed                            with a cold snare. Resection and retrieval were                            complete. Estimated blood loss was minimal.                           The exam was otherwise without abnormality on                            direct and retroflexion views. Complications:            No immediate complications. Estimated blood loss:                            Minimal. Estimated Blood Loss:     Estimated blood loss was minimal. Impression:               - One 3 mm polyp in the proximal ascending colon,                            removed with a cold snare. Resected and retrieved.                           - The examination was otherwise normal on direct                            and retroflexion views. Recommendation:           - Patient has a contact number available for                             emergencies. The signs and symptoms of potential                            delayed complications were discussed with the                            patient. Return to normal activities tomorrow.                            Written discharge instructions were provided to the                            patient.                           - Resume previous diet.                           - Continue present medications.                           - Await pathology results.                           -  Repeat colonoscopy in 7 years for surveillance if                            the polyp is an adenoma. Thornton Park MD, MD 08/28/2019 8:56:58 AM This report has been signed electronically.

## 2019-08-28 NOTE — Progress Notes (Signed)
To PACU, VSS. Report to [email or web address] 

## 2019-08-28 NOTE — Progress Notes (Signed)
Called to room to assist during endoscopic procedure.  Patient ID and intended procedure confirmed with present staff. Received instructions for my participation in the procedure from the performing physician.  

## 2019-09-01 ENCOUNTER — Telehealth: Payer: Self-pay

## 2019-09-01 ENCOUNTER — Encounter: Payer: Self-pay | Admitting: Gastroenterology

## 2019-09-01 NOTE — Telephone Encounter (Signed)
1st follow up call made.  NALM 

## 2019-09-01 NOTE — Telephone Encounter (Signed)
  Follow up Call-  Call back number 08/28/2019  Post procedure Call Back phone  # [phone]  Permission to leave phone message Yes  Some recent data might be hidden     Patient questions:  Do you have a fever, pain , or abdominal swelling? No. Pain Score  0 *  Have you tolerated food without any problems? Yes.    Have you been able to return to your normal activities? Yes.    Do you have any questions about your discharge instructions: Diet   No. Medications  No. Follow up visit  No.  Do you have questions or concerns about your Care? No.  Actions: * If pain score is 4 or above: No action needed, pain <4.  1. Have you developed a fever since your procedure? no  2.   Have you had an respiratory symptoms (SOB or cough) since your procedure? no  3.   Have you tested positive for COVID 19 since your procedure no  4.   Have you had any family members/close contacts diagnosed with the COVID 19 since your procedure?  no   If yes to any of these questions please route to Joylene John, RN and Alphonsa Gin, Therapist, sports.

## 2019-09-10 ENCOUNTER — Other Ambulatory Visit: Payer: Self-pay

## 2019-09-10 ENCOUNTER — Ambulatory Visit
Admission: RE | Admit: 2019-09-10 | Discharge: 2019-09-10 | Disposition: A | Discharge status: Home / Self Care | Payer: BC Managed Care – PPO | Source: Ambulatory Visit | Attending: Obstetrics & Gynecology | Admitting: Obstetrics & Gynecology

## 2019-09-10 DIAGNOSIS — Z1231 Encounter for screening mammogram for malignant neoplasm of breast: Secondary | ICD-10-CM | POA: Diagnosis not present

## 2019-09-14 ENCOUNTER — Ambulatory Visit (INDEPENDENT_AMBULATORY_CARE_PROVIDER_SITE_OTHER): Payer: BC Managed Care – PPO | Admitting: Orthopedic Surgery

## 2019-09-14 ENCOUNTER — Ambulatory Visit: Payer: Self-pay

## 2019-09-14 ENCOUNTER — Encounter: Payer: Self-pay | Admitting: Orthopedic Surgery

## 2019-09-14 ENCOUNTER — Other Ambulatory Visit: Payer: Self-pay

## 2019-09-14 DIAGNOSIS — M542 Cervicalgia: Secondary | ICD-10-CM | POA: Diagnosis not present

## 2019-09-16 ENCOUNTER — Telehealth: Payer: Self-pay | Admitting: Family Medicine

## 2019-09-16 NOTE — Telephone Encounter (Signed)
Linda [name] is calling and stated that patient had labs done on 8/11and needed to know if the labs were routine or medical in nature. CB [phone]

## 2019-09-17 NOTE — Telephone Encounter (Signed)
LMOVM advising that 8.11.20 labs were routine monitoring labs/thx dmf

## 2019-09-19 ENCOUNTER — Encounter: Payer: Self-pay | Admitting: Orthopedic Surgery
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
## 2019-09-20 ENCOUNTER — Encounter: Payer: Self-pay | Admitting: Orthopedic Surgery

## 2019-09-20 DIAGNOSIS — M542 Cervicalgia: Secondary | ICD-10-CM | POA: Diagnosis not present

## 2019-09-20 MED ORDER — LIDOCAINE HCL 1 % IJ SOLN
0.3000 mL | INTRAMUSCULAR | Status: AC | PRN
  Administered 2019-09-20: .3 mL

## 2019-09-20 MED ORDER — TRIAMCINOLONE ACETONIDE 40 MG/ML IJ SUSP
10.0000 mg | INTRAMUSCULAR | Status: AC | PRN
  Administered 2019-09-20: 09:00:00 10 mg via INTRAMUSCULAR

## 2019-10-05 ENCOUNTER — Encounter: Payer: Self-pay | Admitting: Family Medicine

## 2019-10-05 ENCOUNTER — Ambulatory Visit: Payer: BC Managed Care – PPO | Admitting: Family Medicine

## 2019-10-05 ENCOUNTER — Other Ambulatory Visit: Payer: Self-pay

## 2019-10-05 VITALS — BP 142/82 | HR 86 | Temp 97.3°F | Ht 60.0 in | Wt 205.8 lb

## 2019-10-05 DIAGNOSIS — E119 Type 2 diabetes mellitus without complications: Secondary | ICD-10-CM | POA: Diagnosis not present

## 2019-10-05 DIAGNOSIS — L723 Sebaceous cyst: Secondary | ICD-10-CM | POA: Diagnosis not present

## 2019-10-05 DIAGNOSIS — L089 Local infection of the skin and subcutaneous tissue, unspecified: Secondary | ICD-10-CM

## 2019-10-05 LAB — MICROALBUMIN / CREATININE URINE RATIO
Creatinine,U: 89.6 mg/dL
Microalb Creat Ratio: 0.8 mg/g (ref 0.0–30.0)
Microalb, Ur: <0.7 mg/dL (ref 0.0–1.9)

## 2019-10-05 LAB — POCT GLYCOSYLATED HEMOGLOBIN (HGB A1C): Hemoglobin A1C: 8.8 % — AB (ref 4.0–5.6)

## 2019-10-05 MED ORDER — CEPHALEXIN 500 MG PO CAPS: 500.0000 mg | ORAL_CAPSULE | Freq: Three times a day (TID) | ORAL | 0 refills | Status: DC

## 2019-10-05 NOTE — Patient Instructions (Signed)
Good to see you today  A resource that I like is www.dietdoctor.com/diabetes/diet  I recommend you check your blood sugar daily and keep a log.  Very the time you check your blood sugar such as fasting, before meal, 2 hours after a meal and at bedtime.  Look for trends with the foods you are eating and be a scientist of your body.  Here are some guidelines to help you with meal planning -  Avoid all processed and packaged foods (bread, pasta, crackers, chips, etc) and beverages containing calories.  Avoid added sugars and excessive natural sugars.  Attention to how you feel if you consume artificial sweeteners.  Do they make you more hungry or raise your blood sugar?  With every meal and snack, aim to get 20 g of protein (3 ounces of meat, 4 ounces of fish, 3 eggs, protein powder, 1 cup [place] yogurt, 1 cup cottage cheese, etc.)  Increase fiber in the form of non-starchy vegetables.  These help you feel full with very little carbohydrates and are good for gut health.  Eat 1 serving healthy carb per meal- 1/2 cup brown rice, beans, potato, corn- pay attention to whether or not this significantly raises your blood sugar. If it does, reduce the frequency you consume these.   Eat 2-3 servings of lower sugar fruits daily.  This includes berries, apples, oranges, peaches, pears, one half banana.  Have small amounts of good fats such as avocado, nuts, olive oil, nut butters, olives.  Add a little cheese to your salads to make them tasty.

## 2019-10-05 NOTE — Progress Notes (Signed)
Subjective:    Patient ID: Linda [name], female    DOB: 1961-04-01, 59 y.o.   MRN: [id number]  HPI Chief Complaint  Patient presents with  . Cyst    Cyst on back x several years - now sore, some redness. Cannot lay on her back d/t pain at site. Cyst is the size of a 1/2 dollar, raised, hard and a little hot to the touch  This is a 59 year old female, previous patient of Dr. Marjory Lies, who is going to establish care with Dr. Einar Pheasant at a later time.  She presents today with upper back pain and swelling.  She reports that she has had a cyst there for many years, cannot remember what type.  It has not really bothered her until the last week when it became larger and painful.  She has not had any fever, chills, drainage.  She has not taken anything for pain.  History of diabetes mellitus.  She does not check her blood sugars at home.  Last hemoglobin A1c 03/24/2019 was 8.8.  Obesity-struggles with making healthy food choices, very busy at her job.  She lives with her husband and they have recently turned an extra room into a home gym and she is hoping that increased exercise will help.    Review of Systems Per HPI    Objective:   Physical Exam Vitals reviewed.  Constitutional:      General: She is not in acute distress.    Appearance: Normal appearance. She is obese. She is not ill-appearing, toxic-appearing or diaphoretic.  HENT:     Head: Normocephalic and atraumatic.  Cardiovascular:     Rate and Rhythm: Normal rate.  Pulmonary:     Effort: Pulmonary effort is normal.  Skin:    General: Skin is warm and dry.     Findings: Lesion present.       Neurological:     Mental Status: She is alert and oriented to person, place, and time.  Psychiatric:        Mood and Affect: Mood normal.        Behavior: Behavior normal.        Thought Content: Thought content normal.        Judgment: Judgment normal.       BP (!) 142/82 (BP Location: Left Arm, Patient Position: Sitting, Cuff  Size: Large)   Pulse 86   Temp (!) 97.3 F (36.3 C) (Temporal)   Ht 5' (1.524 m)   Wt 205 lb 12.8 oz (93.4 kg)   SpO2 97%   BMI 40.19 kg/m  Wt Readings from Last 3 Encounters:  10/05/19 205 lb 12.8 oz (93.4 kg)  08/28/19 203 lb 9.6 oz (92.4 kg)  08/11/19 203 lb 9.6 oz (92.4 kg)   BP Readings from Last 3 Encounters:  10/05/19 (!) 142/82  08/28/19 (!) 154/93  06/09/19 (!) 140/98       Assessment & Plan:  1. Infected sebaceous cyst - given size and location, will refer to general surgery for removal -Increased size and tenderness recently will give her a course of antibiotics - cephALEXin (KEFLEX) 500 MG capsule; Take 1 capsule (500 mg total) by mouth 3 (three) times daily.  Dispense: 21 capsule; Refill: 0 - Ambulatory referral to General Surgery - POCT glycosylated hemoglobin (Hb A1C)  2. Type 2 diabetes mellitus without complication, without long-term current use of insulin (HCC) - Microalbumin / creatinine urine ratio - POCT glycosylated hemoglobin (Hb A1C)  3. Morbid obesity due  to excess calories [facility]) -Provided written and verbal information regarding healthy eating with balanced meals.  Discussed barriers to making healthy food choices. - POCT glycosylated hemoglobin (Hb A1C)  This visit occurred during the SARS-CoV-2 public health emergency.  Safety protocols were in place, including screening questions prior to the visit, additional usage of staff PPE, and extensive cleaning of exam room while observing appropriate contact time as indicated for disinfecting solutions.    Clarene Reamer, FNP-BC  [facility]  10/05/2019 12:55 PM

## 2019-10-06 ENCOUNTER — Ambulatory Visit: Payer: BC Managed Care – PPO | Admitting: Nurse Practitioner

## 2019-10-06 MED ORDER — GLIPIZIDE ER 5 MG PO TB24: 5.0000 mg | ORAL_TABLET | Freq: Every day | ORAL | 1 refills | Status: DC

## 2019-10-06 NOTE — Addendum Note (Signed)
Addended by: Clarene Reamer B on: 10/06/2019 08:32 AM   Modules accepted: Orders

## 2019-10-07 ENCOUNTER — Telehealth (INDEPENDENT_AMBULATORY_CARE_PROVIDER_SITE_OTHER): Payer: BC Managed Care – PPO | Admitting: Neurology

## 2019-10-07 ENCOUNTER — Ambulatory Visit: Payer: BC Managed Care – PPO | Admitting: Neurology

## 2019-10-07 ENCOUNTER — Encounter: Payer: Self-pay | Admitting: Neurology

## 2019-10-07 ENCOUNTER — Other Ambulatory Visit: Payer: Self-pay

## 2019-10-07 DIAGNOSIS — G43109 Migraine with aura, not intractable, without status migrainosus: Secondary | ICD-10-CM | POA: Diagnosis not present

## 2019-10-07 MED ORDER — AMITRIPTYLINE HCL 10 MG PO TABS: ORAL_TABLET | ORAL | 3 refills | Status: DC

## 2019-10-07 NOTE — Progress Notes (Signed)
Virtual Visit via Video Note The purpose of this virtual visit is to provide medical care while limiting exposure to the novel coronavirus.    Consent was obtained for video visit:  Yes.   Answered questions that patient had about telehealth interaction:  Yes.   I discussed the limitations, risks, security and privacy concerns of performing an evaluation and management service by telemedicine. I also discussed with the patient that there may be a patient responsible charge related to this service. The patient expressed understanding and agreed to proceed.  Pt location: Home Physician Location: office Name of referring provider:  Lucille Passy, MD I connected with [name] at patients initiation/request on 10/07/2019 at 10:00 AM EST by video enabled telemedicine application and verified that I am speaking with the correct person using two identifiers. Pt MRN:  [id number] Pt DOB:  05/20/1961 Video Participants:  [name]   History of Present Illness:  The patient was seen as a virtual video visit on 10/07/2019. She was last seen in the neurology clinic in 07/2018 for dizziness, felt to be secondary to migraines. MRI brain normal. She had seen Dr. Gerlene Fee at [facility] in 2017 with testing showing normal audiogram and vestibular testing, no evidence of peripheral or central vestibular pathology, symptoms felt to be likely migraine-associated dizziness. She obtained a second opinion with Dr. Lavinia Sharps at [facility] in 11/2018, and it is noted that the diagnosis of vestibular migraines is a diagnosis of exclusion, and wondered if hypertensive urgency could be causing some of her symptoms. She was instructed to keep a log of her BP. She states that when she checks her BP it is higher, but when she goes to her PCP, BP is fine and PCP has not noticed any fluctuations in BP. She is not on any BP medications. She feels symptoms are stable since her last visit, she does not get the complete  dizziness she was previously having, every once in a while she feels a little breakthrough usually when she is driving and seeing 3D things going on or the sun's reflection of trees on the roads. She just shakes her head side to side and she feels back to normal. She denies any significant headaches, she has split second pain infrequently. No nausea. She has been taking the amitritpyline intermittently, she takes 1 tab then skips a couple of days. Sometimes she takes 2 tablets when she feels like she has a little issue like the driving. She bases it on how her body is. No side effects. She has not been taking the meclizine. Sleep is good. No focal numbness/tingling/weakness. She has received trigger point injections for neck pain and feels it helped some. She feels her biggest problem is working on herself with exercise and diet. She slipped on ice a few weeks ago, no significant injuries.   History on Initial Assessment 03/16/2016: This is a pleasant 59 yo RH woman with a history of diabetes, GERD, with dizziness. The symptoms started on 01/06/16 while she was driving home when she started feeling as if she would pass out, dizzy with a spinning sensation. She pulled over and called her husband, but was able to eventually drive home. She noticed she would veer to the right when walking. She saw her PCP and was noted to have nystagmus with Dix-Hallpike testing and impacted cerumen on the left. She returned with ear fullness on the right and again had cerumen disimpaction. She continued to have the dizziness, improved  with meclizine, but this caused drowsiness. She underwent vestibular therapy with significant improvement in the dizziness. The spinning sensation has resolved, however she continues to have a residual "floating" sensation particularly noticeable when she is driving. This would be associated with nausea, no vomiting. If she is driving and staying focused forward, she starts feeling "not normal," her ears  will pop, "almost like a pressure," then this would be relieved when she looks off to the side. She constantly feels that there is a glare in her vision, more at night, when things do not seem clear. She had an eye exam which was normal. She has seen ENT and has been told she may have had a viral illness, given a steroid injection last June. She reports a history of herpes simplex from many years ago, when stressed out she feels a "nerve pain" on her arms without any visible lesions. She denies any associated headaches, focal numbness/tingling/weakness. She reports that 3 months prior to the onset of these symptoms, she noticed she would feel lightheaded in the morning while driving. No diplopia, dysarthria/dysphagia, bowel/bladder dysfunction. She has some left-sided neck pain that improved with PT. She denies any head injuries or recent infections. She had been taking Phentermine for weight loss, but stopped it in May when she started having the neck pain. She denies any daytime drowsiness, sleep is good.  Outpatient Encounter Medications as of 10/07/2019  Medication Sig  . amitriptyline (ELAVIL) 10 MG tablet Take 2 tablets at night  . glucose blood test strip Onetouch Delica test strips; UAD to monitor glucose bid;Dx:E11.9  . ibuprofen (ADVIL) 100 MG/5ML suspension Take 200 mg by mouth every 4 (four) hours as needed.  . meclizine (ANTIVERT) 25 MG tablet Take 1 tablet (25 mg total) by mouth 3 (three) times daily as needed for dizziness. (Patient not taking: Reported on 10/05/2019)  . metaxalone (SKELAXIN) 800 MG tablet Take 1 tablet (800 mg total) by mouth at bedtime as needed for muscle spasms.  . metFORMIN (GLUCOPHAGE-XR) 500 MG 24 hr tablet Take 2bid  . Omega-3 Fatty Acids (OMEGA-3 FISH OIL PO) Take by mouth.  [name] Delica Lancets 99991111 MISC UAD to monitor glucose bid;Dx E11.9  . pantoprazole (PROTONIX) 40 MG tablet Take 1 tablet (40 mg total) by mouth daily.  . simvastatin (ZOCOR) 10 MG tablet  Take 1 tablet (10 mg total) by mouth at bedtime.           No facility-administered encounter medications on file as of 10/07/2019.    Observations/Objective:   GEN:  The patient appears stated age and is in NAD.  Neurological examination: Patient is awake, alert, oriented x 3. No aphasia or dysarthria. Intact fluency and comprehension. Remote and recent memory intact. Cranial nerves: Extraocular movements intact with no nystagmus. No facial asymmetry. Motor: moves all extremities symmetrically, at least anti-gravity x 4. No incoordination on finger to nose testing. Gait: narrow-based and steady, able to tandem walk adequately.  Assessment and Plan:   This is a pleasant 59 yo RH woman with a history of diabetes, GERD who presented with dizziness that started in May 2017. The dizziness was initially vertiginous with spinning sensation, that resolved with vestibular therapy, however she continued to have a residual "floating" sensation when she drives, with associated nausea. Her neurological exam is non-focal. MRI brain normal. She underwent vestibular testing and an audiogram at [facility] with normal findings, no evidence of peripheral or central vestibular dysfunction, and symptoms were felt to be due to migraine-associated  dizziness. A second opinion from [facility] indicated the possibility of BP-related symptoms. She is overall stable, mostly having slight symptoms while driving. She takes amitriptyline intermittently either 1 or 2 tabs, depending on how she feels. Refills for amitriptyline sent today. She has not needed meclizine. Follow-up in 1 year, call for any changes.   Follow Up Instructions:   -I discussed the assessment and treatment plan with the patient. The patient was provided an opportunity to ask questions and all were answered. The patient agreed with the plan and demonstrated an understanding of the instructions.   The patient was advised to call back or seek an in-person  evaluation if the symptoms worsen or if the condition fails to improve as anticipated.    Cameron Sprang, MD

## 2019-10-08 DIAGNOSIS — L723 Sebaceous cyst: Secondary | ICD-10-CM | POA: Diagnosis not present

## 2019-10-08 DIAGNOSIS — L02212 Cutaneous abscess of back [any part, except buttock]: Secondary | ICD-10-CM | POA: Diagnosis not present

## 2019-10-22 ENCOUNTER — Encounter: Payer: Self-pay | Admitting: Family Medicine

## 2019-10-28 ENCOUNTER — Other Ambulatory Visit: Payer: Self-pay

## 2019-10-28 MED ORDER — PANTOPRAZOLE SODIUM 40 MG PO TBEC: 40.0000 mg | DELAYED_RELEASE_TABLET | Freq: Every day | ORAL | 1 refills | Status: DC

## 2019-11-03 ENCOUNTER — Ambulatory Visit: Payer: Self-pay

## 2019-11-03 ENCOUNTER — Ambulatory Visit: Payer: BC Managed Care – PPO | Admitting: Orthopaedic Surgery

## 2019-11-03 ENCOUNTER — Encounter: Payer: Self-pay | Admitting: Orthopaedic Surgery

## 2019-11-03 ENCOUNTER — Other Ambulatory Visit: Payer: Self-pay

## 2019-11-03 VITALS — BP 159/91 | HR 75 | Ht 60.0 in | Wt 200.0 lb

## 2019-11-03 DIAGNOSIS — M25511 Pain in right shoulder: Secondary | ICD-10-CM
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
## 2019-11-06 ENCOUNTER — Other Ambulatory Visit: Payer: Self-pay | Admitting: Surgery

## 2019-11-06 DIAGNOSIS — L089 Local infection of the skin and subcutaneous tissue, unspecified: Secondary | ICD-10-CM | POA: Diagnosis not present

## 2019-11-06 DIAGNOSIS — L723 Sebaceous cyst: Secondary | ICD-10-CM | POA: Diagnosis not present

## 2019-11-16 ENCOUNTER — Other Ambulatory Visit: Payer: Self-pay

## 2019-11-16 MED ORDER — PANTOPRAZOLE SODIUM 40 MG PO TBEC: 40.0000 mg | DELAYED_RELEASE_TABLET | Freq: Every day | ORAL | 0 refills | Status: DC

## 2019-11-16 NOTE — Telephone Encounter (Signed)
Patient has NP appointment on 12/07/19 with Dr Einar Pheasant.

## 2019-11-20 ENCOUNTER — Other Ambulatory Visit: Payer: Self-pay

## 2019-11-20 MED ORDER — METFORMIN HCL ER 500 MG PO TB24: ORAL_TABLET | ORAL | 1 refills | Status: DC

## 2019-12-07 ENCOUNTER — Encounter: Payer: Self-pay | Admitting: Family Medicine

## 2019-12-07 ENCOUNTER — Ambulatory Visit: Payer: BC Managed Care – PPO | Admitting: Family Medicine

## 2019-12-07 ENCOUNTER — Other Ambulatory Visit: Payer: Self-pay

## 2019-12-07 VITALS — BP 134/96 | HR 82 | Temp 98.4°F | Resp 18 | Ht 59.75 in | Wt 202.0 lb

## 2019-12-07 DIAGNOSIS — E119 Type 2 diabetes mellitus without complications: Secondary | ICD-10-CM

## 2019-12-07 DIAGNOSIS — E1159 Type 2 diabetes mellitus with other circulatory complications: Secondary | ICD-10-CM | POA: Diagnosis not present

## 2019-12-07 DIAGNOSIS — K219 Gastro-esophageal reflux disease without esophagitis: Secondary | ICD-10-CM | POA: Diagnosis not present

## 2019-12-07 DIAGNOSIS — M25511 Pain in right shoulder: Secondary | ICD-10-CM | POA: Diagnosis not present

## 2019-12-07 DIAGNOSIS — I10 Essential (primary) hypertension: Secondary | ICD-10-CM

## 2019-12-07 NOTE — Assessment & Plan Note (Signed)
Saw ortho, but PT was going to be expensive. Discussed seeing if a different therapist would be an option. If still expensive she could look for out of pocket PT.

## 2019-12-07 NOTE — Assessment & Plan Note (Signed)
Based on home CBG likely improving on glipizide. Cont medications, cont working on exercise and diet. Return in 3 months

## 2019-12-07 NOTE — Assessment & Plan Note (Signed)
BP elevated. She is planning to start exercising and losing weight. Will do 3 months of life style changes and then consider ACE-inhibitor if not improving.

## 2019-12-07 NOTE — Patient Instructions (Addendum)
Your blood pressure high.   High blood pressure increases your risk for heart attack and stroke.    Please check your blood pressure 2-4 times a week.   To check your blood pressure 1) Sit in a quiet and relaxed place for 5 minutes 2) Make sure your feet are flat on the ground 3) Consider checking first thing in the morning   Normal blood pressure is less than 140/90 Ideally you blood pressure should be around 120/80  Other ways you can reduce your blood pressure:  1) Regular exercise -- Try to get 150 minutes (30 minutes, 5 days a week) of moderate to vigorous aerobic excercise -- Examples: brisk walking (2.5 miles per hour), water aerobics, dancing, gardening, tennis, biking slower than 10 miles per hour 2) DASH Diet - low fat meats, more fresh fruits and vegetables, whole grains, low salt 3) Quit smoking if you smoke 4) Loose 5-10% of your body weight  

## 2019-12-07 NOTE — Assessment & Plan Note (Signed)
Continue pantoprazole. Discussed that if she unable to stop w/o immediate return to symptoms we may want to plan for a GI referral to evaluate further. Reports only taking for the last few months.

## 2019-12-07 NOTE — Progress Notes (Signed)
Subjective:     Linda [name] is a 59 y.o. female presenting for Transfer of Care (from Dr Deborra Medina) and Diabetes Tor Netters, Np started patient on Glipizide but pt was not aware so she did not start this medication right away)     HPI  #Diabetes Currently taking recently started Glipizide (10/31/19) and already taking metformin  Using medications without difficulties: No Hypoglycemic episodes:No  Hyperglycemic episodes:No  Feet problems:No  Blood Sugars averaging: 160 Last HgbA1c:  Lab Results  Component Value Date   HGBA1C 8.8 (A) 10/05/2019    Diabetes Health Maintenance Due:    Diabetes Health Maintenance Due  Topic Date Due  . FOOT EXAM  08/02/2018  . OPHTHALMOLOGY EXAM  09/19/2019  . HEMOGLOBIN A1C  04/03/2020  . URINE MICROALBUMIN  10/04/2020    #Reflux - has been taking protonix for several months - when she takes the medicine she doesn't have symptoms - when she stops the medication symptoms return - is modifying diet - avoids lemon, alcohol, eating late - is wanting to work on weight loss  #vestibular migraines - taking medication to prevent   Review of Systems   Social History   Tobacco Use  Smoking Status Never Smoker  Smokeless Tobacco Never Used        Objective:    BP Readings from Last 3 Encounters:  12/07/19 (!) 134/96  11/03/19 (!) 159/91  10/05/19 (!) 142/82   Wt Readings from Last 3 Encounters:  12/07/19 202 lb (91.6 kg)  11/03/19 200 lb (90.7 kg)  10/05/19 200 lb (90.7 kg)    BP (!) 134/96   Pulse 82   Temp 98.4 F (36.9 C)   Resp 18   Ht 4' 11.75" (1.518 m)   Wt 202 lb (91.6 kg)   SpO2 98%   BMI 39.78 kg/m    Physical Exam Constitutional:      General: She is not in acute distress.    Appearance: She is well-developed. She is not diaphoretic.  HENT:     Right Ear: External ear normal.     Left Ear: External ear normal.     Nose: Nose normal.  Eyes:     Conjunctiva/sclera: Conjunctivae normal.    Cardiovascular:     Rate and Rhythm: Normal rate and regular rhythm.     Heart sounds: Murmur present.  Pulmonary:     Effort: Pulmonary effort is normal. No respiratory distress.     Breath sounds: Normal breath sounds. No wheezing.  Musculoskeletal:     Cervical back: Neck supple.  Skin:    General: Skin is warm and dry.     Capillary Refill: Capillary refill takes less than 2 seconds.  Neurological:     Mental Status: She is alert. Mental status is at baseline.  Psychiatric:        Mood and Affect: Mood normal.        Behavior: Behavior normal.           Assessment & Plan:   Problem List Items Addressed This Visit      Cardiovascular and Mediastinum   Hypertension associated with diabetes ([place])    BP elevated. She is planning to start exercising and losing weight. Will do 3 months of life style changes and then consider ACE-inhibitor if not improving.         Digestive   Gastro-esophageal reflux disease without esophagitis    Continue pantoprazole. Discussed that if she unable to stop w/o immediate return  to symptoms we may want to plan for a GI referral to evaluate further. Reports only taking for the last few months.         Endocrine   Type 2 diabetes mellitus ([place]) - Primary    Based on home CBG likely improving on glipizide. Cont medications, cont working on exercise and diet. Return in 3 months        Other   Acute pain of right shoulder    Saw ortho, but PT was going to be expensive. Discussed seeing if a different therapist would be an option. If still expensive she could look for out of pocket PT.       Relevant Orders   Ambulatory referral to Physical Therapy       Return in about 3 months (around 03/07/2020).  Lesleigh Noe, MD

## 2019-12-10 ENCOUNTER — Encounter: Payer: Self-pay | Admitting: Obstetrics & Gynecology

## 2019-12-18 ENCOUNTER — Other Ambulatory Visit: Payer: Self-pay | Admitting: Family Medicine

## 2019-12-18 NOTE — Telephone Encounter (Signed)
Last OV 12/07/19 Last fill 11/16/19  #30/0

## 2020-01-23 ENCOUNTER — Other Ambulatory Visit: Payer: Self-pay | Admitting: Family Medicine

## 2020-02-01 ENCOUNTER — Encounter (HOSPITAL_BASED_OUTPATIENT_CLINIC_OR_DEPARTMENT_OTHER): Payer: Self-pay | Admitting: Surgery

## 2020-02-01 NOTE — Progress Notes (Signed)
Patient's most recent EKG (05/05/19) and medical history reviewed with Dr. Deatra Canter., anesthesiologist. Faythe Ghee to proceed with plan for surgery.

## 2020-02-04 DIAGNOSIS — E119 Type 2 diabetes mellitus without complications: Secondary | ICD-10-CM | POA: Diagnosis not present

## 2020-02-04 LAB — HM DIABETES EYE EXAM

## 2020-02-05 ENCOUNTER — Other Ambulatory Visit (HOSPITAL_COMMUNITY)
Admission: RE | Admit: 2020-02-05 | Discharge: 2020-02-05 | Disposition: A | Discharge status: Home / Self Care | Payer: BC Managed Care – PPO | Source: Ambulatory Visit | Attending: Surgery | Admitting: Surgery

## 2020-02-05 ENCOUNTER — Encounter (HOSPITAL_BASED_OUTPATIENT_CLINIC_OR_DEPARTMENT_OTHER)
Admission: RE | Admit: 2020-02-05 | Discharge: 2020-02-05 | Disposition: A | Discharge status: Home / Self Care | Payer: BC Managed Care – PPO | Source: Ambulatory Visit | Attending: Surgery | Admitting: Surgery

## 2020-02-05 DIAGNOSIS — Z7984 Long term (current) use of oral hypoglycemic drugs: Secondary | ICD-10-CM | POA: Diagnosis not present

## 2020-02-05 DIAGNOSIS — Z20822 Contact with and (suspected) exposure to covid-19: Secondary | ICD-10-CM | POA: Insufficient documentation

## 2020-02-05 DIAGNOSIS — Z01812 Encounter for preprocedural laboratory examination: Secondary | ICD-10-CM | POA: Diagnosis not present

## 2020-02-05 DIAGNOSIS — Z79899 Other long term (current) drug therapy: Secondary | ICD-10-CM | POA: Diagnosis not present

## 2020-02-05 DIAGNOSIS — L723 Sebaceous cyst: Secondary | ICD-10-CM | POA: Diagnosis not present

## 2020-02-05 DIAGNOSIS — L720 Epidermal cyst: Secondary | ICD-10-CM | POA: Diagnosis not present

## 2020-02-05 DIAGNOSIS — K219 Gastro-esophageal reflux disease without esophagitis: Secondary | ICD-10-CM | POA: Diagnosis not present

## 2020-02-05 DIAGNOSIS — I10 Essential (primary) hypertension: Secondary | ICD-10-CM | POA: Diagnosis not present

## 2020-02-05 LAB — BASIC METABOLIC PANEL
Anion gap: 9 (ref 5–15)
BUN: 10 mg/dL (ref 6–20)
CO2: 26 mmol/L (ref 22–32)
Calcium: 9 mg/dL (ref 8.9–10.3)
Chloride: 106 mmol/L (ref 98–111)
Creatinine, Ser: 0.78 mg/dL (ref 0.44–1.00)
GFR calc Af Amer: >60 mL/min (ref >60)
GFR calc non Af Amer: >60 mL/min (ref >60)
Glucose, Bld: 152 mg/dL — ABNORMAL HIGH (ref 70–99)
Potassium: 3.9 mmol/L (ref 3.5–5.1)
Sodium: 141 mmol/L (ref 135–145)

## 2020-02-05 LAB — SARS CORONAVIRUS 2 (TAT 6-24 HRS): SARS Coronavirus 2: NEGATIVE

## 2020-02-05 MED ORDER — ENSURE PRE-SURGERY PO LIQD: 296.0000 mL | Freq: Once | ORAL | Status: DC

## 2020-02-05 NOTE — Progress Notes (Addendum)

## 2020-02-08 NOTE — H&P (Signed)
  [name]  Location: [facility] Patient #: [id number] DOB: October 06, 1960 Married / Language: English / Race: Black or African American Female   History of Present Illness The patient is a 59 year old female who presents with a complaint of Skin problems.  Chief complaint: Chronically infected sebaceous cyst of the upper back  She was seen recently in urgent office with an infected sebaceous cyst on her back requiring incision and drainage. She is now here today to discuss surgical excision of the area. She is still having some pain in the area but reports no drainage. She has finished her course of antibiotics.   Allergies Morphine Sulfate ER *ANALGESICS - OPIOID*  Topiramate *ANTICONVULSANTS*  Allergies Reconciled   Medication History Simvastatin (10MG  Tablet, Oral) Active. Amitriptyline HCl (10MG  Tablet, Oral) Active. metFORMIN HCl (500MG  Tablet, Oral) Active. Pantoprazole Sodium (40MG  Tablet DR, Oral) Active. glipiZIDE (5MG  Tablet, Oral) Active. Medications Reconciled  Vitals   Weight: 205.2 lb Height: 60in Body Surface Area: 1.89 m Body Mass Index: 40.07 kg/m  Temp.: 32F  Pulse: 97 (Regular)  BP: 134/84(Sitting, Left Arm, Standard)     Physical Exam  The physical exam findings are as follows: Note: On exam, the site on her back in the upper area that had been incised and drained is healing well. It is mildly tender but there is no evidence of infection    Assessment & Plan  INFECTED SEBACEOUS CYST (L72.3)  Impression: We discussed the diagnosis of a chronically infected sebaceous cysts. She understands that this will more than likely recur and she was to go ahead and proceed with surgical excision of this area given the previous infection and need for incision and drainage of an abscess. I believe this is very reasonable given the previous infection. I discussed proceeding with an excision of the chronically infected  sebaceous cyst of the upper back. I discussed the risks which include but not limited to bleeding, infection, wound breakdown, recurrence, cardiopulmonary issues with anesthesia, postoperative recovery, etc. She understands and wishes to proceed with surgery

## 2020-02-09 ENCOUNTER — Ambulatory Visit (HOSPITAL_BASED_OUTPATIENT_CLINIC_OR_DEPARTMENT_OTHER): Payer: BC Managed Care – PPO | Admitting: Certified Registered"

## 2020-02-09 ENCOUNTER — Ambulatory Visit (HOSPITAL_BASED_OUTPATIENT_CLINIC_OR_DEPARTMENT_OTHER)
Admission: RE | Admit: 2020-02-09 | Discharge: 2020-02-09 | Disposition: A | Discharge status: Home / Self Care | Payer: BC Managed Care – PPO | Attending: Surgery | Admitting: Surgery

## 2020-02-09 ENCOUNTER — Other Ambulatory Visit: Payer: Self-pay

## 2020-02-09 ENCOUNTER — Encounter (HOSPITAL_BASED_OUTPATIENT_CLINIC_OR_DEPARTMENT_OTHER)
Admission: RE | Disposition: A | Discharge status: Home / Self Care | Payer: Self-pay | Source: Home / Self Care | Attending: Surgery

## 2020-02-09 ENCOUNTER — Encounter (HOSPITAL_BASED_OUTPATIENT_CLINIC_OR_DEPARTMENT_OTHER): Payer: Self-pay | Admitting: Surgery

## 2020-02-09 DIAGNOSIS — Z7984 Long term (current) use of oral hypoglycemic drugs: Secondary | ICD-10-CM | POA: Diagnosis not present

## 2020-02-09 DIAGNOSIS — L720 Epidermal cyst: Secondary | ICD-10-CM | POA: Diagnosis not present

## 2020-02-09 DIAGNOSIS — I10 Essential (primary) hypertension: Secondary | ICD-10-CM | POA: Diagnosis not present

## 2020-02-09 DIAGNOSIS — Z79899 Other long term (current) drug therapy: Secondary | ICD-10-CM | POA: Diagnosis not present

## 2020-02-09 DIAGNOSIS — K219 Gastro-esophageal reflux disease without esophagitis: Secondary | ICD-10-CM | POA: Diagnosis not present

## 2020-02-09 DIAGNOSIS — L723 Sebaceous cyst: Secondary | ICD-10-CM | POA: Diagnosis not present

## 2020-02-09 HISTORY — DX: Essential (primary) hypertension: I10

## 2020-02-09 HISTORY — DX: Dizziness and giddiness: R42

## 2020-02-09 HISTORY — PX: CYST EXCISION: SHX5701

## 2020-02-09 LAB — GLUCOSE, CAPILLARY
Glucose-Capillary: 153 mg/dL — ABNORMAL HIGH (ref 70–99)
Glucose-Capillary: 163 mg/dL — ABNORMAL HIGH (ref 70–99)

## 2020-02-09 SURGERY — CYST REMOVAL
Anesthesia: Monitor Anesthesia Care | Site: Back

## 2020-02-09 MED ORDER — TRAMADOL HCL 50 MG PO TABS: 50.0000 mg | ORAL_TABLET | Freq: Four times a day (QID) | ORAL | 0 refills | Status: DC | PRN

## 2020-02-09 MED ORDER — ACETAMINOPHEN 500 MG PO TABS
ORAL_TABLET | ORAL | Status: AC
  Filled 2020-02-09: qty 2

## 2020-02-09 MED ORDER — CELECOXIB 200 MG PO CAPS
400.0000 mg | ORAL_CAPSULE | ORAL | Status: AC
  Administered 2020-02-09: 400 mg via ORAL

## 2020-02-09 MED ORDER — CELECOXIB 200 MG PO CAPS
ORAL_CAPSULE | ORAL | Status: AC
  Filled 2020-02-09: qty 2

## 2020-02-09 MED ORDER — ONDANSETRON HCL 4 MG/2ML IJ SOLN
INTRAMUSCULAR | Status: DC | PRN
  Administered 2020-02-09: 4 mg via INTRAVENOUS

## 2020-02-09 MED ORDER — PROMETHAZINE HCL 25 MG/ML IJ SOLN: 6.2500 mg | INTRAMUSCULAR | Status: DC | PRN

## 2020-02-09 MED ORDER — MIDAZOLAM HCL 2 MG/2ML IJ SOLN
INTRAMUSCULAR | Status: AC
  Filled 2020-02-09: qty 2

## 2020-02-09 MED ORDER — GABAPENTIN 300 MG PO CAPS
300.0000 mg | ORAL_CAPSULE | ORAL | Status: AC
  Administered 2020-02-09: 300 mg via ORAL

## 2020-02-09 MED ORDER — FENTANYL CITRATE (PF) 100 MCG/2ML IJ SOLN
INTRAMUSCULAR | Status: AC
  Filled 2020-02-09: qty 2

## 2020-02-09 MED ORDER — ACETAMINOPHEN 500 MG PO TABS
1000.0000 mg | ORAL_TABLET | ORAL | Status: AC
  Administered 2020-02-09: 1000 mg via ORAL

## 2020-02-09 MED ORDER — MIDAZOLAM HCL 5 MG/5ML IJ SOLN
INTRAMUSCULAR | Status: DC | PRN
  Administered 2020-02-09: 2 mg via INTRAVENOUS

## 2020-02-09 MED ORDER — ONDANSETRON HCL 4 MG/2ML IJ SOLN
INTRAMUSCULAR | Status: AC
  Filled 2020-02-09: qty 2

## 2020-02-09 MED ORDER — CHLORHEXIDINE GLUCONATE CLOTH 2 % EX PADS: 6.0000 | MEDICATED_PAD | Freq: Once | CUTANEOUS | Status: DC

## 2020-02-09 MED ORDER — OXYCODONE HCL 5 MG/5ML PO SOLN: 5.0000 mg | Freq: Once | ORAL | Status: DC | PRN

## 2020-02-09 MED ORDER — PROPOFOL 500 MG/50ML IV EMUL
INTRAVENOUS | Status: DC | PRN
  Administered 2020-02-09: 200 ug/kg/min via INTRAVENOUS

## 2020-02-09 MED ORDER — OXYCODONE HCL 5 MG PO TABS: 5.0000 mg | ORAL_TABLET | Freq: Once | ORAL | Status: DC | PRN

## 2020-02-09 MED ORDER — PROPOFOL 500 MG/50ML IV EMUL
INTRAVENOUS | Status: AC
  Filled 2020-02-09: qty 50

## 2020-02-09 MED ORDER — LACTATED RINGERS IV SOLN: INTRAVENOUS | Status: DC

## 2020-02-09 MED ORDER — LIDOCAINE-EPINEPHRINE (PF) 1 %-1:200000 IJ SOLN
INTRAMUSCULAR | Status: DC | PRN
  Administered 2020-02-09: 10 mL

## 2020-02-09 MED ORDER — GABAPENTIN 300 MG PO CAPS
ORAL_CAPSULE | ORAL | Status: AC
  Filled 2020-02-09: qty 1

## 2020-02-09 MED ORDER — HYDROMORPHONE HCL 1 MG/ML IJ SOLN: 0.2500 mg | INTRAMUSCULAR | Status: DC | PRN

## 2020-02-09 MED ORDER — CEFAZOLIN SODIUM-DEXTROSE 2-4 GM/100ML-% IV SOLN
INTRAVENOUS | Status: AC
  Filled 2020-02-09: qty 100

## 2020-02-09 MED ORDER — CEFAZOLIN SODIUM-DEXTROSE 2-4 GM/100ML-% IV SOLN
2.0000 g | INTRAVENOUS | Status: AC
  Administered 2020-02-09: 2 g via INTRAVENOUS

## 2020-02-09 MED ORDER — FENTANYL CITRATE (PF) 100 MCG/2ML IJ SOLN
INTRAMUSCULAR | Status: DC | PRN
  Administered 2020-02-09 (×2): 50 ug via INTRAVENOUS

## 2020-02-09 SURGICAL SUPPLY — 33 items
BLADE SURG 15 STRL LF DISP TIS (BLADE) ×1 IMPLANT
BLADE SURG 15 STRL SS (BLADE) ×3
CANISTER SUCT 1200ML W/VALVE (MISCELLANEOUS) IMPLANT
CHLORAPREP W/TINT 26 (MISCELLANEOUS) ×3 IMPLANT
COVER BACK TABLE 60X90IN (DRAPES) ×3 IMPLANT
COVER MAYO STAND STRL (DRAPES) ×3 IMPLANT
COVER WAND RF STERILE (DRAPES) IMPLANT
DECANTER SPIKE VIAL GLASS SM (MISCELLANEOUS) IMPLANT
DERMABOND ADVANCED (GAUZE/BANDAGES/DRESSINGS) ×2
DERMABOND ADVANCED .7 DNX12 (GAUZE/BANDAGES/DRESSINGS) ×1 IMPLANT
DRAPE LAPAROTOMY 100X72 PEDS (DRAPES) ×3 IMPLANT
DRAPE UTILITY XL STRL (DRAPES) ×3 IMPLANT
ELECT REM PT RETURN 9FT ADLT (ELECTROSURGICAL) ×3
ELECTRODE REM PT RTRN 9FT ADLT (ELECTROSURGICAL) ×1 IMPLANT
GLOVE SURG SIGNA 7.5 PF LTX (GLOVE) ×3 IMPLANT
GOWN STRL REUS W/ TWL LRG LVL3 (GOWN DISPOSABLE) ×1 IMPLANT
GOWN STRL REUS W/ TWL XL LVL3 (GOWN DISPOSABLE) ×1 IMPLANT
GOWN STRL REUS W/TWL LRG LVL3 (GOWN DISPOSABLE) ×3
GOWN STRL REUS W/TWL XL LVL3 (GOWN DISPOSABLE) ×3
NEEDLE HYPO 25X1 1.5 SAFETY (NEEDLE) ×3 IMPLANT
NS IRRIG 1000ML POUR BTL (IV SOLUTION) IMPLANT
PENCIL SMOKE EVACUATOR (MISCELLANEOUS) ×3 IMPLANT
SET BASIN DAY SURGERY F.S. (CUSTOM PROCEDURE TRAY) ×3 IMPLANT
SLEEVE SCD COMPRESS KNEE MED (MISCELLANEOUS) IMPLANT
SPONGE LAP 4X18 RFD (DISPOSABLE) ×3 IMPLANT
SUT MNCRL AB 4-0 PS2 18 (SUTURE) ×3 IMPLANT
SUT VIC AB 3-0 SH 27 (SUTURE) ×3
SUT VIC AB 3-0 SH 27X BRD (SUTURE) ×1 IMPLANT
SYR CONTROL 10ML LL (SYRINGE) ×3 IMPLANT
TOWEL GREEN STERILE FF (TOWEL DISPOSABLE) ×3 IMPLANT
TUBE CONNECTING 20'X1/4 (TUBING)
TUBE CONNECTING 20X1/4 (TUBING) IMPLANT
YANKAUER SUCT BULB TIP NO VENT (SUCTIONS) ×3 IMPLANT

## 2020-02-09 NOTE — Interval H&P Note (Signed)
History and Physical Interval Note:no change in H and P  02/09/2020 7:44 AM  Linda [name]  has presented today for surgery, with the diagnosis of CHONICALLY INFECTED SEBACEOUS CYST UPPER BACK.  The various methods of treatment have been discussed with the patient and family. After consideration of risks, benefits and other options for treatment, the patient has consented to  Procedure(s): EXCISION UPPER BACK CYST (N/A) as a surgical intervention.  The patient's history has been reviewed, patient examined, no change in status, stable for surgery.  I have reviewed the patient's chart and labs.  Questions were answered to the patient's satisfaction.     Coralie Keens

## 2020-02-09 NOTE — Op Note (Signed)
EXCISION UPPER BACK CYST  Procedure Note  LISAMARIE COKE 02/09/2020   Pre-op Diagnosis: CHONICALLY INFECTED SEBACEOUS CYST UPPER BACK     Post-op Diagnosis: same  Procedure(s): EXCISION UPPER BACK CYST (2 cm)  Surgeon(s): Coralie Keens, MD  Anesthesia: Monitor Anesthesia Care  Staff:  Circulator: McDonough-Hughes, Delene Ruffini, RN Scrub Person: Jackie Plum Circulator Assistant: Izora Ribas, RN  Estimated Blood Loss: Minimal               Specimens: sent to path  Procedure: The patient was brought to the operating room and identifies correct patient.  She is placed upon the operating table and turned into the left lateral decubitus position.  Anesthesia was induced.  Her upper back was prepped and draped in usual sterile fashion.  I anesthetized skin around the previously infected chronic sebaceous cyst with lidocaine.  I then made elliptical incision around the cyst with a scalpel and excised the 2 cm cyst in its entirety with the cautery.  It was sent to pathology for evaluation.  I achieved hemostasis with cautery.  I then closed the subcutaneous tissue with interrupted 3-0 Vicryl sutures and closed the skin with a running 4-0 Monocryl.  Dermabond was then applied.  The patient tolerated the procedure well.  All the counts were correct at the end of the procedure.  The patient was then taken in a stable condition from the operating room to the recovery room.          Coralie Keens   Date: 02/09/2020  Time: 8:34 AM

## 2020-02-09 NOTE — Transfer of Care (Signed)
Immediate Anesthesia Transfer of Care Note  Patient: [name]  Procedure(s) Performed: EXCISION UPPER BACK CYST (N/A Back)  Patient Location: PACU  Anesthesia Type:MAC  Level of Consciousness: awake, alert  and oriented  Airway & Oxygen Therapy: Patient Spontanous Breathing and Patient connected to face mask oxygen  Post-op Assessment: Report given to RN and Post -op Vital signs reviewed and stable  Post vital signs: Reviewed and stable  Last Vitals:  Vitals Value Taken Time  BP    Temp    Pulse 73 02/09/20 0834  Resp 19 02/09/20 0834  SpO2 100 % 02/09/20 0834  Vitals shown include unvalidated device data.  Last Pain:  Vitals:   02/09/20 0722  TempSrc: Oral  PainSc: 0-No pain      Patients Stated Pain Goal: 3 (90/30/09 2330)  Complications: No complications documented.

## 2020-02-09 NOTE — Anesthesia Preprocedure Evaluation (Signed)
Anesthesia Evaluation  Patient identified by MRN, date of birth, ID band Patient awake    Reviewed: Allergy & Precautions, NPO status , Patient's Chart, lab work & pertinent test results  Airway Mallampati: II  TM Distance: >3 FB Neck ROM: Full    Dental no notable dental hx.    Pulmonary neg pulmonary ROS,    Pulmonary exam normal breath sounds clear to auscultation       Cardiovascular hypertension, Pt. on medications negative cardio ROS Normal cardiovascular exam Rhythm:Regular Rate:Normal     Neuro/Psych  Headaches, negative psych ROS   GI/Hepatic Neg liver ROS, GERD  ,  Endo/Other  negative endocrine ROSdiabetes  Renal/GU negative Renal ROS  negative genitourinary   Musculoskeletal negative musculoskeletal ROS (+)   Abdominal (+) + obese,   Peds negative pediatric ROS (+)  Hematology negative hematology ROS (+)   Anesthesia Other Findings   Reproductive/Obstetrics negative OB ROS                             Anesthesia Physical Anesthesia Plan  ASA: III  Anesthesia Plan: MAC   Post-op Pain Management:    Induction: Intravenous  PONV Risk Score and Plan: 2 and Ondansetron, Midazolam and Treatment may vary due to age or medical condition  Airway Management Planned: Simple Face Mask  Additional Equipment:   Intra-op Plan:   Post-operative Plan:   Informed Consent: I have reviewed the patients History and Physical, chart, labs and discussed the procedure including the risks, benefits and alternatives for the proposed anesthesia with the patient or authorized representative who has indicated his/her understanding and acceptance.     Dental advisory given  Plan Discussed with: CRNA  Anesthesia Plan Comments:         Anesthesia Quick Evaluation

## 2020-02-09 NOTE — Anesthesia Postprocedure Evaluation (Signed)
Anesthesia Post Note  Patient: [name]  Procedure(s) Performed: EXCISION UPPER BACK CYST (N/A Back)     Patient location during evaluation: PACU Anesthesia Type: MAC Level of consciousness: awake and alert Pain management: pain level controlled Vital Signs Assessment: post-procedure vital signs reviewed and stable Respiratory status: spontaneous breathing, nonlabored ventilation and respiratory function stable Cardiovascular status: blood pressure returned to baseline and stable Postop Assessment: no apparent nausea or vomiting Anesthetic complications: no   No complications documented.  Last Vitals:  Vitals:   02/09/20 0856 02/09/20 0910  BP: (!) 145/85 (!) 158/91  Pulse: 67 69  Resp: 19 18  Temp:  36.6 C  SpO2: 99% 100%    Last Pain:  Vitals:   02/09/20 0910  TempSrc: Oral  PainSc: 0-No pain                 Lynda Rainwater

## 2020-02-09 NOTE — Discharge Instructions (Signed)
Ok to shower starting tomorrow  No vigorous activity for one week  Tylenol, ibuprofen and ice pack also for pain  Ok to return to work on 02/16/2020 to full activity  No Tylenol until 1:30pm if needed. No Ibuprofen/Motrin until 3:30pm if needed.    Post Anesthesia Home Care Instructions  Activity: Get plenty of rest for the remainder of the day. A responsible individual must stay with you for 24 hours following the procedure.  For the next 24 hours, DO NOT: -Drive a car -Paediatric nurse -Drink alcoholic beverages -Take any medication unless instructed by your physician -Make any legal decisions or sign important papers.  Meals: Start with liquid foods such as gelatin or soup. Progress to regular foods as tolerated. Avoid greasy, spicy, heavy foods. If nausea and/or vomiting occur, drink only clear liquids until the nausea and/or vomiting subsides. Call your physician if vomiting continues.  Special Instructions/Symptoms: Your throat may feel dry or sore from the anesthesia or the breathing tube placed in your throat during surgery. If this causes discomfort, gargle with warm salt water. The discomfort should disappear within 24 hours.

## 2020-02-10 ENCOUNTER — Encounter (HOSPITAL_BASED_OUTPATIENT_CLINIC_OR_DEPARTMENT_OTHER): Payer: Self-pay | Admitting: Surgery

## 2020-02-10 LAB — SURGICAL PATHOLOGY

## 2020-02-14 ENCOUNTER — Other Ambulatory Visit: Payer: Self-pay | Admitting: Neurology

## 2020-02-14 DIAGNOSIS — G43109 Migraine with aura, not intractable, without status migrainosus: Secondary | ICD-10-CM

## 2020-02-17 ENCOUNTER — Encounter: Payer: Self-pay | Admitting: Family Medicine

## 2020-03-07 ENCOUNTER — Ambulatory Visit (INDEPENDENT_AMBULATORY_CARE_PROVIDER_SITE_OTHER): Payer: BC Managed Care – PPO | Admitting: Family Medicine

## 2020-03-07 ENCOUNTER — Other Ambulatory Visit: Payer: Self-pay

## 2020-03-07 ENCOUNTER — Encounter: Payer: Self-pay | Admitting: Family Medicine

## 2020-03-07 VITALS — BP 120/90 | HR 92 | Temp 96.9°F | Wt 207.5 lb

## 2020-03-07 DIAGNOSIS — I10 Essential (primary) hypertension: Secondary | ICD-10-CM

## 2020-03-07 DIAGNOSIS — E785 Hyperlipidemia, unspecified: Secondary | ICD-10-CM

## 2020-03-07 DIAGNOSIS — E1159 Type 2 diabetes mellitus with other circulatory complications: Secondary | ICD-10-CM

## 2020-03-07 DIAGNOSIS — E119 Type 2 diabetes mellitus without complications: Secondary | ICD-10-CM | POA: Diagnosis not present

## 2020-03-07 LAB — POCT GLYCOSYLATED HEMOGLOBIN (HGB A1C): Hemoglobin A1C: 6.6 % — AB (ref 4.0–5.6)

## 2020-03-07 MED ORDER — LISINOPRIL 5 MG PO TABS: 5.0000 mg | ORAL_TABLET | Freq: Every day | ORAL | 3 refills | Status: DC

## 2020-03-07 NOTE — Assessment & Plan Note (Signed)
BP elevated. Discussed low dose lisinopril and home monitoring. Return in 4 weeks for bp check and labs. Symptoms of low bp discussed and can reduce pill

## 2020-03-07 NOTE — Assessment & Plan Note (Signed)
Cont statin, check labs today.

## 2020-03-07 NOTE — Progress Notes (Signed)
Subjective:     Linda [name] is a 59 y.o. female presenting for Follow-up (3 month- DM )     HPI   #Diabetes Currently taking metformin and glipizide  Using medications without difficulties: No Hypoglycemic episodes:getting lows in 40s 2/2 to glipizide Hyperglycemic episodes:No  Feet problems:No  Blood Sugars averaging: 120-130 Last HgbA1c:  Lab Results  Component Value Date   HGBA1C 6.6 (A) 03/07/2020   Will take the glipizide only on days when she is going to eat more   Diabetes Health Maintenance Due:    Diabetes Health Maintenance Due  Topic Date Due  . HEMOGLOBIN A1C  09/07/2020  . OPHTHALMOLOGY EXAM  02/03/2021  . FOOT EXAM  03/07/2021    #HTN - checks at home - up and down   Review of Systems   Social History   Tobacco Use  Smoking Status Never Smoker  Smokeless Tobacco Never Used        Objective:    BP Readings from Last 3 Encounters:  03/07/20 (!) 120/90  02/09/20 (!) 158/91  12/07/19 (!) 134/96   Wt Readings from Last 3 Encounters:  03/07/20 (!) 207 lb 8 oz (94.1 kg)  02/09/20 204 lb 5.9 oz (92.7 kg)  12/07/19 202 lb (91.6 kg)    BP (!) 120/90   Pulse 92   Temp (!) 96.9 F (36.1 C) (Temporal)   Wt (!) 207 lb 8 oz (94.1 kg)   LMP 08/28/2015 (Approximate)   SpO2 98%   BMI 40.52 kg/m    Physical Exam Constitutional:      General: She is not in acute distress.    Appearance: She is well-developed. She is not diaphoretic.  HENT:     Right Ear: External ear normal.     Left Ear: External ear normal.  Eyes:     Conjunctiva/sclera: Conjunctivae normal.  Cardiovascular:     Rate and Rhythm: Normal rate.  Pulmonary:     Effort: Pulmonary effort is normal.  Musculoskeletal:     Cervical back: Neck supple.  Skin:    General: Skin is warm and dry.     Capillary Refill: Capillary refill takes less than 2 seconds.  Neurological:     Mental Status: She is alert. Mental status is at baseline.  Psychiatric:        Mood and  Affect: Mood normal.        Behavior: Behavior normal.    Diabetic Foot Exam - Simple   Simple Foot Form Diabetic Foot exam was performed with the following findings: Yes 03/07/2020  3:35 PM  Visual Inspection No deformities, no ulcerations, no other skin breakdown bilaterally: Yes Sensation Testing Intact to touch and monofilament testing bilaterally: Yes Pulse Check Posterior Tibialis and Dorsalis pulse intact bilaterally: Yes Comments           Assessment & Plan:   Problem List Items Addressed This Visit      Cardiovascular and Mediastinum   Hypertension associated with diabetes ([place])    BP elevated. Discussed low dose lisinopril and home monitoring. Return in 4 weeks for bp check and labs. Symptoms of low bp discussed and can reduce pill       Relevant Medications   lisinopril (ZESTRIL) 5 MG tablet   Other Relevant Orders   Lipid panel     Endocrine   Type 2 diabetes mellitus ([place]) - Primary    Controlled. Can decrease glipizide - discussed stopping but as she has been reducing use based  on diet she will consider if she commits to more consistent dietary changes - she is planning to start a weight loss program soon. Return 3 months      Relevant Medications   lisinopril (ZESTRIL) 5 MG tablet   Other Relevant Orders   POCT glycosylated hemoglobin (Hb A1C) (Completed)   Lipid panel     Other   HLD (hyperlipidemia)    Cont statin, check labs today.       Relevant Medications   lisinopril (ZESTRIL) 5 MG tablet   Other Relevant Orders   Lipid panel       Return in about 4 weeks (around 04/04/2020) for bp and blood work.  Lesleigh Noe, MD  This visit occurred during the SARS-CoV-2 public health emergency.  Safety protocols were in place, including screening questions prior to the visit, additional usage of staff PPE, and extensive cleaning of exam room while observing appropriate contact time as indicated for disinfecting solutions.

## 2020-03-07 NOTE — Patient Instructions (Addendum)
#  Diabetes - keep up the good work with diet - continue to reduce the use of glipizide when you are doing well on diet   Your blood pressure high.   Start lisinopril 5 mg daily    High blood pressure increases your risk for heart attack and stroke.    Please check your blood pressure 2-4 times a week.   To check your blood pressure 1) Sit in a quiet and relaxed place for 5 minutes 2) Make sure your feet are flat on the ground 3) Consider checking first thing in the morning   Normal blood pressure is less than 140/90 Ideally you blood pressure should be around 120/80  Other ways you can reduce your blood pressure:  1) Regular exercise -- Try to get 150 minutes (30 minutes, 5 days a week) of moderate to vigorous aerobic excercise -- Examples: brisk walking (2.5 miles per hour), water aerobics, dancing, gardening, tennis, biking slower than 10 miles per hour 2) DASH Diet - low fat meats, more fresh fruits and vegetables, whole grains, low salt 3) Quit smoking if you smoke 4) Loose 5-10% of your body weight

## 2020-03-07 NOTE — Assessment & Plan Note (Signed)
Controlled. Can decrease glipizide - discussed stopping but as she has been reducing use based on diet she will consider if she commits to more consistent dietary changes - she is planning to start a weight loss program soon. Return 3 months

## 2020-03-08 ENCOUNTER — Other Ambulatory Visit: Payer: Self-pay | Admitting: Family Medicine

## 2020-03-08 LAB — LIPID PANEL
Cholesterol: 187 mg/dL (ref 0–200)
HDL: 60.6 mg/dL (ref >39.00)
LDL Cholesterol: 103 mg/dL — ABNORMAL HIGH (ref 0–99)
NonHDL: 126.34
Total CHOL/HDL Ratio: 3
Triglycerides: 116 mg/dL (ref 0.0–149.0)
VLDL: 23.2 mg/dL (ref 0.0–40.0)

## 2020-03-08 MED ORDER — SIMVASTATIN 20 MG PO TABS: 20.0000 mg | ORAL_TABLET | Freq: Every day | ORAL | 3 refills | Status: DC

## 2020-04-12 ENCOUNTER — Ambulatory Visit: Payer: BC Managed Care – PPO | Admitting: Family Medicine

## 2020-05-16 ENCOUNTER — Other Ambulatory Visit: Payer: Self-pay | Admitting: Family Medicine

## 2020-05-16 DIAGNOSIS — E119 Type 2 diabetes mellitus without complications: Secondary | ICD-10-CM

## 2020-05-22 ENCOUNTER — Other Ambulatory Visit: Payer: Self-pay | Admitting: Family Medicine

## 2020-07-05 ENCOUNTER — Other Ambulatory Visit: Payer: Self-pay

## 2020-07-05 ENCOUNTER — Encounter: Payer: Self-pay | Admitting: Family Medicine

## 2020-07-05 ENCOUNTER — Other Ambulatory Visit: Payer: Self-pay | Admitting: Family Medicine

## 2020-07-05 MED ORDER — METFORMIN HCL ER 500 MG PO TB24: ORAL_TABLET | ORAL | 0 refills | Status: DC

## 2020-07-06 ENCOUNTER — Other Ambulatory Visit: Payer: Self-pay | Admitting: Family Medicine

## 2020-07-06 DIAGNOSIS — E119 Type 2 diabetes mellitus without complications: Secondary | ICD-10-CM

## 2020-07-06 MED ORDER — METFORMIN HCL ER 500 MG PO TB24: ORAL_TABLET | ORAL | 1 refills | Status: DC

## 2020-07-06 NOTE — Progress Notes (Signed)
Please let pt know I sent in a refill of her metformin.   Would recommend f/u visit for blood pressure and lab work when she returns from being out of town in next 1-2 weeks.

## 2020-07-20 ENCOUNTER — Other Ambulatory Visit (INDEPENDENT_AMBULATORY_CARE_PROVIDER_SITE_OTHER): Payer: BC Managed Care – PPO

## 2020-07-20 ENCOUNTER — Ambulatory Visit: Payer: BC Managed Care – PPO | Admitting: Nurse Practitioner

## 2020-07-20 ENCOUNTER — Encounter: Payer: Self-pay | Admitting: Nurse Practitioner

## 2020-07-20 VITALS — BP 164/96 | HR 76 | Ht 60.0 in | Wt 203.0 lb

## 2020-07-20 DIAGNOSIS — R112 Nausea with vomiting, unspecified: Secondary | ICD-10-CM

## 2020-07-20 DIAGNOSIS — K219 Gastro-esophageal reflux disease without esophagitis: Secondary | ICD-10-CM

## 2020-07-20 LAB — C-REACTIVE PROTEIN: CRP: 1.2 mg/dL (ref 0.5–20.0)

## 2020-07-20 LAB — CBC WITH DIFFERENTIAL/PLATELET
Basophils Absolute: 0 10*3/uL (ref 0.0–0.1)
Basophils Relative: 0.5 % (ref 0.0–3.0)
Eosinophils Absolute: 0.1 10*3/uL (ref 0.0–0.7)
Eosinophils Relative: 1.4 % (ref 0.0–5.0)
HCT: 36.6 % (ref 36.0–46.0)
Hemoglobin: 11.9 g/dL — ABNORMAL LOW (ref 12.0–15.0)
Lymphocytes Relative: 22.8 % (ref 12.0–46.0)
Lymphs Abs: 1.6 10*3/uL (ref 0.7–4.0)
MCHC: 32.5 g/dL (ref 30.0–36.0)
MCV: 79.5 fl (ref 78.0–100.0)
Monocytes Absolute: 0.4 10*3/uL (ref 0.1–1.0)
Monocytes Relative: 5.8 % (ref 3.0–12.0)
Neutro Abs: 4.8 10*3/uL (ref 1.4–7.7)
Neutrophils Relative %: 69.5 % (ref 43.0–77.0)
Platelets: 299 10*3/uL (ref 150.0–400.0)
RBC: 4.61 Mil/uL (ref 3.87–5.11)
RDW: 14.7 % (ref 11.5–15.5)
WBC: 6.9 10*3/uL (ref 4.0–10.5)

## 2020-07-20 LAB — COMPREHENSIVE METABOLIC PANEL
ALT: 16 U/L (ref 0–35)
AST: 16 U/L (ref 0–37)
Albumin: 4.1 g/dL (ref 3.5–5.2)
Alkaline Phosphatase: 100 U/L (ref 39–117)
BUN: 16 mg/dL (ref 6–23)
CO2: 29 mEq/L (ref 19–32)
Calcium: 9.3 mg/dL (ref 8.4–10.5)
Chloride: 103 mEq/L (ref 96–112)
Creatinine, Ser: 0.74 mg/dL (ref 0.40–1.20)
GFR: 89.85 mL/min (ref >60.00)
Glucose, Bld: 152 mg/dL — ABNORMAL HIGH (ref 70–99)
Potassium: 4.1 mEq/L (ref 3.5–5.1)
Sodium: 138 mEq/L (ref 135–145)
Total Bilirubin: 0.6 mg/dL (ref 0.2–1.2)
Total Protein: 7.6 g/dL (ref 6.0–8.3)

## 2020-07-20 LAB — LIPASE: Lipase: 20 U/L (ref 11.0–59.0)

## 2020-07-20 MED ORDER — PANTOPRAZOLE SODIUM 40 MG PO TBEC: 40.0000 mg | DELAYED_RELEASE_TABLET | Freq: Every day | ORAL | 1 refills | Status: DC

## 2020-07-20 NOTE — Progress Notes (Signed)
07/20/2020 [name] [id number] 19-May-1961   Chief Complaint: Worsening reflux  History of Present Illness: Linda [name] is a 59 year old female with a past medical history of hypertension, hyperlipidemia, diabetes mellitus type 2 and GERD.  She presents to our office today for further evaluation regarding reflux symptoms.  She reports having worsening acid reflux symptoms for the past 2 to 3 months.  She has nausea and intermittent vomiting which typically occurs after she eats fatty foods.  She describes her emesis as clear to yellowish fluid.  No hematemesis.  Her heartburn is worse after she drinks alcohol or acidic juices.  She stopped taking Pantoprazole approximately 6 months ago as recommended by her PCP.  Her reflux symptoms were previously well controlled.  She had one episode of difficulty swallowing chicken which briefly got stuck in her esophagus which resolved immediately after she drank water.  No further episodes of dysphagia.  No upper or lower abdominal pain.  She underwent an EGD by Dr. Deatra Ina 07/20/2014 which showed inflammatory changes to the esophagus without evidence of Barrett's esophagus.  She is passing normal formed brown bowel movement daily.  No rectal bleeding or melena.  She underwent a colonoscopy 08/28/2019 and 1 tubular adenomatous polyp was removed from the colon.  She was advised to repeat a colonoscopy in 7 years.  Infrequent NSAID use no other complaints at this time.   EGD 07/20/2014: ENDOSCOPIC IMPRESSION: 1. The z-line at 36cm - r/o Barrett's esophagus 2. EGD was otherwise normal Surgical [P], esophagus, biopsy - BENIGN GLANDULAR/COLUMNAR MUCOSA WITH MODERATE CHRONIC INFLAMMATION, SEE COMMENT. - NEGATIVE FOR INTESTINAL METAPLASIA WITH GOBLET CELLS. - NEGATIVE FOR DYSPLASIA.  Colonoscopy 08/28/2019: - One 3 mm polyp in the proximal ascending colon, removed with a cold snare. Resected and retrieved. - The examination was otherwise normal  on direct and retroflexion views.  Current Outpatient Medications on File Prior to Visit  Medication Sig Dispense Refill  . amitriptyline (ELAVIL) 10 MG tablet TAKE 2 TABLETS BY MOUTH AT NIGHT 180 tablet 3  . glipiZIDE (GLUCOTROL XL) 5 MG 24 hr tablet TAKE 1 TABLET BY MOUTH EVERY DAY WITH BREAKFAST 90 tablet 1  . glucose blood test strip Onetouch Delica test strips; UAD to monitor glucose bid;Dx:E11.9 100 each 12  . ibuprofen (ADVIL) 100 MG/5ML suspension Take 200 mg by mouth every 4 (four) hours as needed.    Marland Kitchen lisinopril (ZESTRIL) 5 MG tablet Take 1 tablet (5 mg total) by mouth daily. 90 tablet 3  . meclizine (ANTIVERT) 25 MG tablet Take 1 tablet (25 mg total) by mouth 3 (three) times daily as needed for dizziness. 30 tablet 5  . metFORMIN (GLUCOPHAGE-XR) 500 MG 24 hr tablet TAKE 2 TABLETS BY MOUTH TWICE A DAY 360 tablet 1  . Omega-3 Fatty Acids (OMEGA-3 FISH OIL PO) Take by mouth.    [name] Delica Lancets 40J MISC UAD to monitor glucose bid;Dx E11.9 100 each 12  . simvastatin (ZOCOR) 20 MG tablet Take 1 tablet (20 mg total) by mouth at bedtime. 90 tablet 3  . [DISCONTINUED] pantoprazole (PROTONIX) 40 MG tablet Take 1 tablet (40 mg total) by mouth daily. 30 tablet 0   No current facility-administered medications on file prior to visit.   Allergies  Allergen Reactions  . Morphine Itching    Hydrocodone is OK  . Topamax [Topiramate] Rash    Current Medications, Allergies, Past Medical History, Past Surgical History, Family History and Social History were reviewed in [employer]  electronic medical record.   Review of Systems:   Constitutional: Negative for fever, sweats, chills or weight loss.  Respiratory: Negative for shortness of breath.   Cardiovascular: Negative for chest pain, palpitations and leg swelling.  Gastrointestinal: See HPI.  Musculoskeletal: Negative for back pain or muscle aches.  Neurological: Negative for dizziness, headaches or paresthesias.     Physical Exam: BP (!) 164/96   Pulse 76   Ht 5' (1.524 m)   Wt 203 lb (92.1 kg)   LMP 08/28/2015 (Approximate)   BMI 39.65 kg/m  General: Well developed 59 year old female in no acute distress. Head: Normocephalic and atraumatic. Eyes: No scleral icterus. Conjunctiva pink . Ears: Normal auditory acuity. Mouth: Dentition intact. No ulcers or lesions.  Lungs: Clear throughout to auscultation. Heart: Regular rate and rhythm, no murmur. Abdomen: Soft, nontender and nondistended. No masses or hepatomegaly. Normal bowel sounds x 4 quadrants.  Rectal: Deferred.  Musculoskeletal: Symmetrical with no gross deformities. Extremities: No edema. Neurological: Alert oriented x 4. No focal deficits.  Psychological: Alert and cooperative. Normal mood and affect  Assessment and Recommendations:  13.  59 year old female with reflux symptoms and one isolated episode of dysphagia.   -Restart Pantoprazole 40 mg once daily -GERD hand -EGD benefits and risks discussed including risk with sedation, risk of bleeding, perforation and infection  -Patient to call our office if her symptoms worsen  2.  Intermittent nausea and vomiting which occurs primarily after eating fatty food -Abdominal ultrasound to assess the gallbladder -Avoid fatty food -CBC, CMP, lipase and CRP  3.  History of tubular adenomatous colon polyp -Next colonoscopy due January 2028  Further follow-up to be determined after the above evaluation completed

## 2020-07-20 NOTE — Patient Instructions (Signed)
If you are age 57 or older, your body mass index should be between 23-30. Your Body mass index is 39.65 kg/m. If this is out of the aforementioned range listed, please consider follow up with your Primary Care Provider.  If you are age 49 or younger, your body mass index should be between 19-25. Your Body mass index is 39.65 kg/m. If this is out of the aformentioned range listed, please consider follow up with your Primary Care Provider.   Your provider has requested that you go to the basement level for lab work before leaving today. Press "B" on the elevator. The lab is located at the first door on the left as you exit the elevator.  We have sent the following medications to your pharmacy for you to pick up at your convenience: Pantoprazole 40 mg daily.   Call office if symptoms get worse.   You have been scheduled for an abdominal ultrasound at [facility] (1st floor of hospital) on Thursday 07/28/20 at 9:30 am. Please arrive 15 minutes prior to your appointment for registration. Make certain not to have anything to eat or drink 6 hours prior to your appointment. Should you need to reschedule your appointment, please contact radiology at [phone]. This test typically takes about 30 minutes to perform.

## 2020-07-21 NOTE — Progress Notes (Signed)
Reviewed and agree with management plans. ? ?Candon Caras L. Sharone Almond, MD, MPH  ?

## 2020-07-26 ENCOUNTER — Other Ambulatory Visit: Payer: Self-pay

## 2020-07-26 ENCOUNTER — Ambulatory Visit: Payer: BC Managed Care – PPO | Admitting: Family Medicine

## 2020-07-26 ENCOUNTER — Encounter: Payer: Self-pay | Admitting: Family Medicine

## 2020-07-26 VITALS — BP 124/90 | HR 78 | Temp 97.6°F | Ht 60.0 in | Wt 203.0 lb

## 2020-07-26 DIAGNOSIS — D649 Anemia, unspecified: Secondary | ICD-10-CM

## 2020-07-26 DIAGNOSIS — K219 Gastro-esophageal reflux disease without esophagitis: Secondary | ICD-10-CM

## 2020-07-26 DIAGNOSIS — I152 Hypertension secondary to endocrine disorders: Secondary | ICD-10-CM

## 2020-07-26 DIAGNOSIS — E1159 Type 2 diabetes mellitus with other circulatory complications: Secondary | ICD-10-CM | POA: Diagnosis not present

## 2020-07-26 DIAGNOSIS — E1169 Type 2 diabetes mellitus with other specified complication: Secondary | ICD-10-CM | POA: Diagnosis not present

## 2020-07-26 DIAGNOSIS — E119 Type 2 diabetes mellitus without complications: Secondary | ICD-10-CM | POA: Diagnosis not present

## 2020-07-26 LAB — POCT GLYCOSYLATED HEMOGLOBIN (HGB A1C): Hemoglobin A1C: 7.1 % — AB (ref 4.0–5.6)

## 2020-07-26 NOTE — Assessment & Plan Note (Signed)
Normocytic and just below the normal level. No bleeding per patient. Will recheck in 1-2 months. Reassurance

## 2020-07-26 NOTE — Patient Instructions (Addendum)
#   Diabetes - Continue Metformin 1000 mg twice daily - Stop the Glipizide   - Check blood sugar - Fasting Goal: <140 - 2 hour post meal: <180  #Blood pressure - Stop lisinopril - check in with home monitoring in 2-3 weeks - Will consider Hydrochlorothiazide or Chlorthalidone if still high    Your blood pressure high.   High blood pressure increases your risk for heart attack and stroke.   Please check your blood pressure 2-4 times a week.   To check your blood pressure 1) Sit in a quiet and relaxed place for 5 minutes 2) Make sure your feet are flat on the ground 3) Consider checking first thing in the morning   Normal blood pressure is less than 140/90 Ideally you blood pressure should be around 120/80

## 2020-07-26 NOTE — Assessment & Plan Note (Signed)
Improved with avoiding food triggers. Saw GI and has pantoprazole but has not started. Has EGD coming up as well. Appreciate GI support.

## 2020-07-26 NOTE — Assessment & Plan Note (Signed)
hgbA1c worse in setting of inconsistent medication use. She will take metformin 1000 mg BID and do home monitoring. Hold glipizide. If Glucose at goal will continue with single agent. If CBGs are elevated, will plan to restart glipizide 5 mg daily. Cont diabetic diet and work on weight loss.

## 2020-07-26 NOTE — Assessment & Plan Note (Addendum)
BP elevated. Unable to tolerate ACE-I due to angioedema. Home monitoring x 2 weeks. If elevated will add Thiazide.

## 2020-07-26 NOTE — Progress Notes (Signed)
Subjective:     Linda [name] is a 59 y.o. female presenting for Follow-up (DM )     HPI   #Diabetes Currently taking metformin and glipizide  Using medications without difficulties: No Hypoglycemic episodes:No  Hyperglycemic episodes:No  Feet problems:No  Blood Sugars averaging: 140 Last HgbA1c:  Lab Results  Component Value Date   HGBA1C 7.1 (A) 07/26/2020   Is adjusting her medications due to what she eats May only take 1 or 2 metformin Will occasionally taking glipizide  Has been doing OK with diet  Diabetes Health Maintenance Due:    Diabetes Health Maintenance Due  Topic Date Due  . URINE MICROALBUMIN  10/04/2020  . HEMOGLOBIN A1C  01/24/2021  . OPHTHALMOLOGY EXAM  02/03/2021  . FOOT EXAM  03/07/2021   #HTN - lisinopril 5 mg - lip swelling, then numbness - then started having acid reflux - when she stopped the numbness improved    Review of Systems   Social History   Tobacco Use  Smoking Status Never Smoker  Smokeless Tobacco Never Used        Objective:    BP Readings from Last 3 Encounters:  07/26/20 124/90  07/20/20 (!) 164/96  03/07/20 (!) 120/90   Wt Readings from Last 3 Encounters:  07/26/20 203 lb (92.1 kg)  07/20/20 203 lb (92.1 kg)  03/07/20 (!) 207 lb 8 oz (94.1 kg)    BP 124/90   Pulse 78   Temp 97.6 F (36.4 C) (Temporal)   Ht 5' (1.524 m)   Wt 203 lb (92.1 kg)   LMP 08/28/2015 (Approximate)   SpO2 98%   BMI 39.65 kg/m    Physical Exam Constitutional:      General: She is not in acute distress.    Appearance: She is well-developed. She is not diaphoretic.  HENT:     Right Ear: External ear normal.     Left Ear: External ear normal.  Eyes:     Conjunctiva/sclera: Conjunctivae normal.  Cardiovascular:     Rate and Rhythm: Normal rate and regular rhythm.  Pulmonary:     Effort: Pulmonary effort is normal.     Breath sounds: Normal breath sounds.  Musculoskeletal:     Cervical back: Neck supple.   Skin:    General: Skin is warm and dry.     Capillary Refill: Capillary refill takes less than 2 seconds.  Neurological:     Mental Status: She is alert. Mental status is at baseline.  Psychiatric:        Mood and Affect: Mood normal.        Behavior: Behavior normal.           Assessment & Plan:   Problem List Items Addressed This Visit      Cardiovascular and Mediastinum   Hypertension associated with diabetes ([place])    BP elevated. Unable to tolerate ACE-I due to angioedema. Home monitoring x 2 weeks. If elevated will add Thiazide.         Digestive   Gastro-esophageal reflux disease without esophagitis    Improved with avoiding food triggers. Saw GI and has pantoprazole but has not started. Has EGD coming up as well. Appreciate GI support.         Endocrine   Type 2 diabetes mellitus with other specified complication ([place]) - Primary    hgbA1c worse in setting of inconsistent medication use. She will take metformin 1000 mg BID and do home monitoring. Hold glipizide. If Glucose  at goal will continue with single agent. If CBGs are elevated, will plan to restart glipizide 5 mg daily. Cont diabetic diet and work on weight loss.         Other   Anemia    Normocytic and just below the normal level. No bleeding per patient. Will recheck in 1-2 months. Reassurance          Return in about 3 months (around 10/24/2020) for Diabetes .  Lesleigh Noe, MD  This visit occurred during the SARS-CoV-2 public health emergency.  Safety protocols were in place, including screening questions prior to the visit, additional usage of staff PPE, and extensive cleaning of exam room while observing appropriate contact time as indicated for disinfecting solutions.

## 2020-07-28 ENCOUNTER — Ambulatory Visit (HOSPITAL_COMMUNITY): Payer: BC Managed Care – PPO

## 2020-07-29 ENCOUNTER — Ambulatory Visit (HOSPITAL_COMMUNITY)
Admission: RE | Admit: 2020-07-29 | Discharge: 2020-07-29 | Disposition: A | Discharge status: Home / Self Care | Payer: BC Managed Care – PPO | Source: Ambulatory Visit | Attending: Nurse Practitioner | Admitting: Nurse Practitioner

## 2020-07-29 ENCOUNTER — Other Ambulatory Visit: Payer: Self-pay

## 2020-07-29 DIAGNOSIS — R112 Nausea with vomiting, unspecified: Secondary | ICD-10-CM

## 2020-07-29 DIAGNOSIS — K760 Fatty (change of) liver, not elsewhere classified: Secondary | ICD-10-CM | POA: Diagnosis not present

## 2020-07-29 DIAGNOSIS — K219 Gastro-esophageal reflux disease without esophagitis: Secondary | ICD-10-CM

## 2020-08-05 DIAGNOSIS — Z03818 Encounter for observation for suspected exposure to other biological agents ruled out: Secondary | ICD-10-CM | POA: Diagnosis not present

## 2020-08-11 ENCOUNTER — Encounter: Payer: Self-pay | Admitting: Gastroenterology

## 2020-08-17 ENCOUNTER — Encounter: Payer: Self-pay | Admitting: Gastroenterology

## 2020-08-17 ENCOUNTER — Ambulatory Visit (AMBULATORY_SURGERY_CENTER): Payer: BC Managed Care – PPO | Admitting: Gastroenterology

## 2020-08-17 ENCOUNTER — Other Ambulatory Visit: Payer: Self-pay

## 2020-08-17 VITALS — BP 127/67 | HR 68 | Temp 97.1°F | Resp 14 | Ht 60.0 in | Wt 203.0 lb

## 2020-08-17 DIAGNOSIS — K449 Diaphragmatic hernia without obstruction or gangrene: Secondary | ICD-10-CM | POA: Diagnosis not present

## 2020-08-17 DIAGNOSIS — R112 Nausea with vomiting, unspecified: Secondary | ICD-10-CM

## 2020-08-17 DIAGNOSIS — K219 Gastro-esophageal reflux disease without esophagitis: Secondary | ICD-10-CM

## 2020-08-17 MED ORDER — SODIUM CHLORIDE 0.9 % IV SOLN: 500.0000 mL | Freq: Once | INTRAVENOUS | Status: DC

## 2020-08-17 NOTE — Progress Notes (Signed)
PT taken to PACU. Monitors in place. VSS. Report given to RN. 

## 2020-08-17 NOTE — Op Note (Signed)
[facility] Patient Name: Linda [name] Procedure Date: 08/17/2020 10:08 AM MRN: [id number] Endoscopist: Thornton Park MD, MD Age: 60 Referring MD:  Date of Birth: 29-Apr-1961 Gender: Female Account #: [id number] Procedure:                Upper GI endoscopy Indications:              Dysphagia, Suspected esophageal reflux Medicines:                Monitored Anesthesia Care Procedure:                Pre-Anesthesia Assessment:                           - Prior to the procedure, a History and Physical                            was performed, and patient medications and                            allergies were reviewed. The patient's tolerance of                            previous anesthesia was also reviewed. The risks                            and benefits of the procedure and the sedation                            options and risks were discussed with the patient.                            All questions were answered, and informed consent                            was obtained. Prior Anticoagulants: The patient has                            taken no previous anticoagulant or antiplatelet                            agents. ASA Grade Assessment: II - A patient with                            mild systemic disease. After reviewing the risks                            and benefits, the patient was deemed in                            satisfactory condition to undergo the procedure.                           After obtaining informed consent, the endoscope was  passed under direct vision. Throughout the                            procedure, the patient's blood pressure, pulse, and                            oxygen saturations were monitored continuously. The                            Endoscope was introduced through the mouth, and                            advanced to the third part of duodenum. The upper                            GI endoscopy  was accomplished without difficulty.                            The patient tolerated the procedure well. Scope In: Scope Out: Findings:                 LA Grade A (one or more mucosal breaks less than 5                            mm, not extending between tops of 2 mucosal folds)                            esophagitis with no bleeding was found. Biopsies                            were taken from the mid/proximal and distal                            esophagus with a cold forceps for histology.                            Estimated blood loss was minimal.                           A Hill Grade 2 hiatal hernia was present.                           The stomach was normal.                           The examined duodenum was normal. Complications:            No immediate complications. Estimated blood loss:                            Minimal. Estimated Blood Loss:     Estimated blood loss was minimal. Impression:               - LA Grade A reflux esophagitis with no bleeding.  Biopsied.                           - Hill Grade 2 hiatal hernia.                           - Normal stomach.                           - Normal examined duodenum. Recommendation:           - Patient has a contact number available for                            emergencies. The signs and symptoms of potential                            delayed complications were discussed with the                            patient. Return to normal activities tomorrow.                            Written discharge instructions were provided to the                            patient.                           - Resume previous diet.                           - Continue present medications including                            pantoprazole 40 mg daily.                           - Await pathology results.                           - Continue with reflux modifications.                           - Follow-up in  the office with [name] or Dr. Tarri Glenn in 4-6 weeks, earlier                            if needed. Thornton Park MD, MD 08/17/2020 10:54:38 AM This report has been signed electronically.

## 2020-08-17 NOTE — Progress Notes (Signed)
VS-MO 

## 2020-08-17 NOTE — Patient Instructions (Signed)
Handouts given: esophagitis, Hiatal hernia Resume previous diet Continue current medications, as well as pantoprazole 40mg  daily  Await pathology results   YOU HAD AN ENDOSCOPIC PROCEDURE TODAY AT THE [facility]:   Refer to the procedure report that was given to you for any specific questions about what was found during the examination.  If the procedure report does not answer your questions, please call your gastroenterologist to clarify.  If you requested that your care partner not be given the details of your procedure findings, then the procedure report has been included in a sealed envelope for you to review at your convenience later.  YOU SHOULD EXPECT: Some feelings of bloating in the abdomen. Passage of more gas than usual.  Walking can help get rid of the air that was put into your GI tract during the procedure and reduce the bloating. If you had a lower endoscopy (such as a colonoscopy or flexible sigmoidoscopy) you may notice spotting of blood in your stool or on the toilet paper. If you underwent a bowel prep for your procedure, you may not have a normal bowel movement for a few days.  Please Note:  You might notice some irritation and congestion in your nose or some drainage.  This is from the oxygen used during your procedure.  There is no need for concern and it should clear up in a day or so.  SYMPTOMS TO REPORT IMMEDIATELY:   Following upper endoscopy (EGD)  Vomiting of blood or coffee ground material  New chest pain or pain under the shoulder blades  Painful or persistently difficult swallowing  New shortness of breath  Fever of 100F or higher  Black, tarry-looking stools  For urgent or emergent issues, a gastroenterologist can be reached at any hour by calling [phone]. Do not use MyChart messaging for urgent concerns.   DIET:  We do recommend a small meal at first, but then you may proceed to your regular diet.  Drink plenty of fluids but you should  avoid alcoholic beverages for 24 hours.  ACTIVITY:  You should plan to take it easy for the rest of today and you should NOT DRIVE or use heavy machinery until tomorrow (because of the sedation medicines used during the test).    FOLLOW UP: Our staff will call the number listed on your records 48-72 hours following your procedure to check on you and address any questions or concerns that you may have regarding the information given to you following your procedure. If we do not reach you, we will leave a message.  We will attempt to reach you two times.  During this call, we will ask if you have developed any symptoms of COVID 19. If you develop any symptoms (ie: fever, flu-like symptoms, shortness of breath, cough etc.) before then, please call [phone].  If you test positive for Covid 19 in the 2 weeks post procedure, please call and report this information to [phone].    If any biopsies were taken you will be contacted by phone or by letter within the next 1-3 weeks.  Please call us at [phone] if you have not heard about the biopsies in 3 weeks.   SIGNATURES/CONFIDENTIALITY: You and/or your care partner have signed paperwork which will be entered into your electronic medical record.  These signatures attest to the fact that that the information above on your After Visit Summary has been reviewed and is understood.  Full responsibility of the confidentiality of this discharge  information lies with you and/or your care-partner. 

## 2020-08-17 NOTE — Progress Notes (Signed)
Called to room to assist during endoscopic procedure.  Patient ID and intended procedure confirmed with present staff. Received instructions for my participation in the procedure from the performing physician.  

## 2020-08-19 ENCOUNTER — Telehealth: Payer: Self-pay | Admitting: *Deleted

## 2020-08-19 ENCOUNTER — Telehealth: Payer: Self-pay

## 2020-08-19 NOTE — Telephone Encounter (Signed)
  Follow up Call-  Call back number 08/17/2020 08/28/2019  Post procedure Call Back phone  # [phone]  Permission to leave phone message Yes Yes  Some recent data might be hidden     Patient questions:  Do you have a fever, pain , or abdominal swelling? No. Pain Score  0 *  Have you tolerated food without any problems? Yes.    Have you been able to return to your normal activities? Yes.    Do you have any questions about your discharge instructions: Diet   No. Medications  No. Follow up visit  No.  Do you have questions or concerns about your Care? No.  Actions: * If pain score is 4 or above: No action needed, pain <4. 1. Have you developed a fever since your procedure? no  2.   Have you had an respiratory symptoms (SOB or cough) since your procedure? no  3.   Have you tested positive for COVID 19 since your procedure no  4.   Have you had any family members/close contacts diagnosed with the COVID 19 since your procedure?  no   If yes to any of these questions please route to Joylene John, RN and Joella Prince, RN

## 2020-08-19 NOTE — Telephone Encounter (Signed)
No answer, number identifier, message left we will attempt to call later in the day.

## 2020-08-22 ENCOUNTER — Encounter: Payer: Self-pay | Admitting: Gastroenterology

## 2020-08-23 ENCOUNTER — Other Ambulatory Visit: Payer: Self-pay | Admitting: Family Medicine

## 2020-08-24 ENCOUNTER — Encounter: Payer: Self-pay | Admitting: Family Medicine

## 2020-08-24 DIAGNOSIS — K449 Diaphragmatic hernia without obstruction or gangrene: Secondary | ICD-10-CM | POA: Insufficient documentation

## 2020-08-30 DIAGNOSIS — Z20822 Contact with and (suspected) exposure to covid-19: Secondary | ICD-10-CM | POA: Diagnosis not present

## 2020-08-30 DIAGNOSIS — Z03818 Encounter for observation for suspected exposure to other biological agents ruled out: Secondary | ICD-10-CM | POA: Diagnosis not present

## 2020-09-27 ENCOUNTER — Ambulatory Visit: Payer: BC Managed Care – PPO | Admitting: Gastroenterology

## 2020-09-28 DIAGNOSIS — E118 Type 2 diabetes mellitus with unspecified complications: Secondary | ICD-10-CM | POA: Diagnosis not present

## 2020-09-29 ENCOUNTER — Other Ambulatory Visit: Payer: Self-pay | Admitting: Nurse Practitioner

## 2020-09-30 ENCOUNTER — Encounter: Payer: Self-pay | Admitting: Family Medicine

## 2020-10-07 ENCOUNTER — Ambulatory Visit: Payer: BC Managed Care – PPO | Admitting: Neurology

## 2020-10-07 ENCOUNTER — Other Ambulatory Visit: Payer: Self-pay

## 2020-10-07 ENCOUNTER — Encounter: Payer: Self-pay | Admitting: Neurology

## 2020-10-07 VITALS — BP 137/85 | HR 72 | Ht 60.0 in | Wt 193.6 lb

## 2020-10-07 DIAGNOSIS — G43109 Migraine with aura, not intractable, without status migrainosus: Secondary | ICD-10-CM | POA: Diagnosis not present

## 2020-10-07 MED ORDER — AMITRIPTYLINE HCL 10 MG PO TABS: ORAL_TABLET | ORAL | 3 refills | Status: DC

## 2020-10-07 NOTE — Patient Instructions (Signed)
Always good to see you. Refills for the amitriptyline have been sent to your pharmacy. Follow-up in 1 year, call for any changes.

## 2020-10-07 NOTE — Progress Notes (Signed)
NEUROLOGY FOLLOW UP OFFICE NOTE  Linda [name] [id number] Dec 23, 1960  HISTORY OF PRESENT ILLNESS: I had the pleasure of seeing Linda [name] in follow-up in the neurology clinic on 10/07/2020.  The patient was last seen a year ago for dizziness, felt secondary to migraines. MRI brain normal. She has been to [facility] and [facility] for second opinions. [facility] felt symptoms likely migraine-related dizziness. [facility] noted vestibular migraines is a diagnosis of exclusion and wondered if hypertensive urgency could be causing some of her symptoms. Over the past year, she reports that she has been doing really good. She has not had any major problems, on occasion she may feel a little breakthrough pretty much when she is driving, but not to the extent where she has to pull off the road. She takes amitriptyline 10mg  1-2 tablets every 1-2 days to keep it in her system. Attempts to wean off medication in the past caused worsening of symptoms with falls. She denies any headaches, vision changes, focal numbness/tingling/weakness, no falls. Sleep is good. No side effects on amitriptyline.    History on Initial Assessment 03/16/2016: This is a pleasant 60 yo RH woman with a history of diabetes, GERD, with dizziness. The symptoms started on 01/06/16 while she was driving home when she started feeling as if she would pass out, dizzy with a spinning sensation. She pulled over and called her husband, but was able to eventually drive home. She noticed she would veer to the right when walking. She saw her PCP and was noted to have nystagmus with Dix-Hallpike testing and impacted cerumen on the left. She returned with ear fullness on the right and again had cerumen disimpaction. She continued to have the dizziness, improved with meclizine, but this caused drowsiness. She underwent vestibular therapy with significant improvement in the dizziness. The spinning sensation has resolved, however she continues to have  a residual "floating" sensation particularly noticeable when she is driving. This would be associated with nausea, no vomiting. If she is driving and staying focused forward, she starts feeling "not normal," her ears will pop, "almost like a pressure," then this would be relieved when she looks off to the side. She constantly feels that there is a glare in her vision, more at night, when things do not seem clear. She had an eye exam which was normal. She has seen ENT and has been told she may have had a viral illness, given a steroid injection last June. She reports a history of herpes simplex from many years ago, when stressed out she feels a "nerve pain" on her arms without any visible lesions. She denies any associated headaches, focal numbness/tingling/weakness. She reports that 3 months prior to the onset of these symptoms, she noticed she would feel lightheaded in the morning while driving. No diplopia, dysarthria/dysphagia, bowel/bladder dysfunction. She has some left-sided neck pain that improved with PT. She denies any head injuries or recent infections. She had been taking Phentermine for weight loss, but stopped it in May when she started having the neck pain. She denies any daytime drowsiness, sleep is good.  PAST MEDICAL HISTORY: Past Medical History:  Diagnosis Date  . Allergic rhinitis   . Allergy    SEASONAL- pt states she has not had issues in "years"  . Anemia   . Cardiac murmur    1/6 SEM at [facility] (old finding per patient)- no heart murmur per Dr. Deborra Medina  . Diabetes mellitus without complication ([place])   . GERD (gastroesophageal  reflux disease)   . Hyperlipidemia   . Hypertension   . Neuralgic migraines    "silent migraines"  . Obesity   . Vertigo     MEDICATIONS: Current Outpatient Medications on File Prior to Visit  Medication Sig Dispense Refill  . amitriptyline (ELAVIL) 10 MG tablet TAKE 2 TABLETS BY MOUTH AT NIGHT 180 tablet 3  . glucose blood test strip Onetouch Delica  test strips; UAD to monitor glucose bid;Dx:E11.9 100 each 12  . ibuprofen (ADVIL) 100 MG/5ML suspension Take 200 mg by mouth every 4 (four) hours as needed.    . metFORMIN (GLUCOPHAGE-XR) 500 MG 24 hr tablet TAKE 2 TABLETS BY MOUTH TWICE A DAY 360 tablet 1  . Omega-3 Fatty Acids (OMEGA-3 FISH OIL PO) Take by mouth.    [name] Delica Lancets 70W MISC UAD to monitor glucose bid;Dx E11.9 100 each 12  . pantoprazole (PROTONIX) 40 MG tablet TAKE 1 TABLET BY MOUTH EVERY DAY 30 tablet 11  . simvastatin (ZOCOR) 20 MG tablet Take 1 tablet (20 mg total) by mouth at bedtime. 90 tablet 3   Current Facility-Administered Medications on File Prior to Visit  Medication Dose Route Frequency Provider Last Rate Last Admin  . 0.9 %  sodium chloride infusion  500 mL Intravenous Once Thornton Park, MD        ALLERGIES: Allergies  Allergen Reactions  . Morphine Itching    Hydrocodone is OK  . Lisinopril Swelling    Numbness and swelling of lips   . Topamax [Topiramate] Rash    FAMILY HISTORY: Family History  Problem Relation Age of Onset  . Hypertension Mother   . Early death Father        car accident  . Diabetes Maternal Aunt   . Renal Disease Maternal Aunt   . Heart disease Maternal Grandmother   . Heart disease Paternal Grandmother   . Colon cancer Neg Hx   . Stomach cancer Neg Hx   . Pancreatic cancer Neg Hx   . Esophageal cancer Neg Hx   . Liver disease Neg Hx   . Rectal cancer Neg Hx     SOCIAL HISTORY: Social History   Socioeconomic History  . Marital status: Married    Spouse name: Juanda Crumble  . Number of children: 0  . Years of education: Masters  . Highest education level: Not on file  Occupational History  . 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 Smoking status: Never Smoker  . Smokeless tobacco: Never Used  Vaping Use  . Vaping Use: Never used  Substance and Sexual Activity  . Alcohol use: Not Currently    Alcohol/week: 0.0  standard drinks  . Drug use: No  . Sexual activity: Yes    Birth control/protection: Post-menopausal  Other Topics Concern  . Not on file  Social History Narrative   12/07/19   From: the area   Living: with husband, Juanda Crumble   Work: Teacher, English as a foreign language at [employer]   Education: [place], [place] 2013      Family: Step son - Dahna Hattabaugh       Enjoys: sing - band shows      Exercise: walking on the treadmill    Diet: not doing great with this, trying to cut back on carb intake      Safety   Seat belts: Yes    Guns: Yes  and secure   Safe in relationships: Yes  left handed    Social Determinants of Health   Financial Resource Strain: Not on file  Food Insecurity: Not on file  Transportation Needs: Not on file  Physical Activity: Not on file  Stress: Not on file  Social Connections: Not on file  Intimate Partner Violence: Not on file     PHYSICAL EXAM: Vitals:   10/07/20 0957  BP: 137/85  Pulse: 72  SpO2: 100%   General: No acute distress Head:  Normocephalic/atraumatic Skin/Extremities: No rash, no edema Neurological Exam: alert and awake. No aphasia or dysarthria. Fund of knowledge is appropriate.  Recent and remote memory are intact.  Attention and concentration are normal.   Cranial nerves: Pupils equal, round. Extraocular movements intact with no nystagmus. Visual fields full.  No facial asymmetry.  Motor: Bulk and tone normal, muscle strength 5/5 throughout with no pronator drift.   Finger to nose testing intact.  Gait narrow-based and steady, able to tandem walk adequately.  Romberg negative.   IMPRESSION: This is a pleasant 60 yo RH woman with a history of diabetes, GERD who presented with dizziness that started in May 2017. The dizziness was initially vertiginous with spinning sensation, that resolved with vestibular therapy, however she continued to have a residual "floating" sensation when she drives, with associated nausea. Exam and brain MRI normal.  She underwent vestibular testing and an audiogram at [facility] with normal findings, no evidence of peripheral or central vestibular dysfunction, and symptoms were felt to be due to migraine-associated dizziness. A second opinion from [facility] indicated the possibility of BP-related symptoms. She reports doing well on current regimen of amitriptyline 1-2 tabs every 1-2 nights, no headaches. Refills sent. Follow-up in 1 year, she knows to call for any changes.    Thank you for allowing me to participate in her care.  Please do not hesitate to call for any questions or concerns.   Ellouise Newer, M.D.   CC: Dr. Einar Pheasant

## 2020-10-25 ENCOUNTER — Other Ambulatory Visit: Payer: Self-pay

## 2020-10-25 ENCOUNTER — Ambulatory Visit: Payer: BC Managed Care – PPO | Admitting: Family Medicine

## 2020-10-25 ENCOUNTER — Encounter: Payer: Self-pay | Admitting: Family Medicine

## 2020-10-25 VITALS — BP 142/80 | HR 72 | Temp 97.5°F | Ht 60.0 in | Wt 190.2 lb

## 2020-10-25 DIAGNOSIS — E1159 Type 2 diabetes mellitus with other circulatory complications: Secondary | ICD-10-CM

## 2020-10-25 DIAGNOSIS — G43809 Other migraine, not intractable, without status migrainosus: Secondary | ICD-10-CM | POA: Diagnosis not present

## 2020-10-25 DIAGNOSIS — K2100 Gastro-esophageal reflux disease with esophagitis, without bleeding: Secondary | ICD-10-CM | POA: Diagnosis not present

## 2020-10-25 DIAGNOSIS — E1169 Type 2 diabetes mellitus with other specified complication: Secondary | ICD-10-CM | POA: Diagnosis not present

## 2020-10-25 DIAGNOSIS — E785 Hyperlipidemia, unspecified: Secondary | ICD-10-CM

## 2020-10-25 DIAGNOSIS — E119 Type 2 diabetes mellitus without complications: Secondary | ICD-10-CM | POA: Diagnosis not present

## 2020-10-25 DIAGNOSIS — I152 Hypertension secondary to endocrine disorders: Secondary | ICD-10-CM

## 2020-10-25 LAB — POCT GLYCOSYLATED HEMOGLOBIN (HGB A1C): Hemoglobin A1C: 6.5 % — AB (ref 4.0–5.6)

## 2020-10-25 NOTE — Assessment & Plan Note (Signed)
BP elevated today. Home monitoring. Cont healthy eating and weight loss. Call if elevated at home

## 2020-10-25 NOTE — Assessment & Plan Note (Signed)
Cont simvastatin.  Lab Results  Component Value Date   CHOL 187 03/07/2020   HDL 60.60 03/07/2020   LDLCALC 103 (H) 03/07/2020   LDLDIRECT 118.9 07/05/2014   TRIG 116.0 03/07/2020   CHOLHDL 3 03/07/2020

## 2020-10-25 NOTE — Assessment & Plan Note (Signed)
Improved after weight loss. protonix prn.

## 2020-10-25 NOTE — Progress Notes (Signed)
Subjective:     Linda [name] is a 60 y.o. female presenting for Follow-up (3 month- DM )     HPI   #Diabetes Currently taking metformin (Glucophage, Riomet) - taking this as needed but only taking 1000 mg once daily Using medications without difficulties: No Hypoglycemic episodes:No  Hyperglycemic episodes:No  Feet problems:No  Blood Sugars averaging: 112-120 Last HgbA1c:  Lab Results  Component Value Date   HGBA1C 6.5 (A) 10/25/2020   On a program to help with diabetes management Blood sugars impacted significantly by food   Diabetes Health Maintenance Due:    Diabetes Health Maintenance Due  Topic Date Due  . URINE MICROALBUMIN  10/04/2020  . OPHTHALMOLOGY EXAM  02/03/2021  . FOOT EXAM  03/07/2021  . HEMOGLOBIN A1C  04/27/2021       Review of Systems   Social History   Tobacco Use  Smoking Status Never Smoker  Smokeless Tobacco Never Used        Objective:    BP Readings from Last 3 Encounters:  10/25/20 (!) 142/80  10/07/20 137/85  08/17/20 127/67   Wt Readings from Last 3 Encounters:  10/25/20 190 lb 4 oz (86.3 kg)  10/07/20 193 lb 9.6 oz (87.8 kg)  08/17/20 203 lb (92.1 kg)    BP (!) 142/80   Pulse 72   Temp (!) 97.5 F (36.4 C) (Temporal)   Ht 5' (1.524 m)   Wt 190 lb 4 oz (86.3 kg)   LMP 08/28/2015 (Approximate)   SpO2 99%   BMI 37.16 kg/m    Physical Exam Constitutional:      General: She is not in acute distress.    Appearance: She is well-developed. She is not diaphoretic.  HENT:     Right Ear: External ear normal.     Left Ear: External ear normal.  Eyes:     Conjunctiva/sclera: Conjunctivae normal.  Cardiovascular:     Rate and Rhythm: Normal rate and regular rhythm.     Heart sounds: No murmur heard.   Pulmonary:     Effort: Pulmonary effort is normal. No respiratory distress.     Breath sounds: Normal breath sounds. No wheezing.  Musculoskeletal:     Cervical back: Neck supple.  Skin:    General: Skin  is warm and dry.     Capillary Refill: Capillary refill takes less than 2 seconds.  Neurological:     Mental Status: She is alert. Mental status is at baseline.  Psychiatric:        Mood and Affect: Mood normal.        Behavior: Behavior normal.           Assessment & Plan:   Problem List Items Addressed This Visit      Cardiovascular and Mediastinum   Vestibular migraine    Following with neurology. Doing well. Cont Amitritpline      Hypertension associated with diabetes ([place])    BP elevated today. Home monitoring. Cont healthy eating and weight loss. Call if elevated at home        Digestive   GERD with esophagitis    Improved after weight loss. protonix prn.         Endocrine   Type 2 diabetes mellitus with other specified complication (HCC)    HTN and HLD. Excellent control. Decrease metformin to 500 mg once daily and take BID if high sugars. Cont diet and weight loss. Return 6 months  Other   HLD (hyperlipidemia)    Cont simvastatin.  Lab Results  Component Value Date   CHOL 187 03/07/2020   HDL 60.60 03/07/2020   LDLCALC 103 (H) 03/07/2020   LDLDIRECT 118.9 07/05/2014   TRIG 116.0 03/07/2020   CHOLHDL 3 03/07/2020          Other Visit Diagnoses    Type 2 diabetes mellitus without complication, without long-term current use of insulin (HCC)    -  Primary   Relevant Orders   POCT glycosylated hemoglobin (Hb A1C) (Completed)   Microalbumin / creatinine urine ratio       Return in about 6 months (around 04/27/2021).  Lesleigh Noe, MD  This visit occurred during the SARS-CoV-2 public health emergency.  Safety protocols were in place, including screening questions prior to the visit, additional usage of staff PPE, and extensive cleaning of exam room while observing appropriate contact time as indicated for disinfecting solutions.

## 2020-10-25 NOTE — Patient Instructions (Addendum)
You are doing great!  Come back in 6 months  Try to see how you do on Metformin 500 mg once a day, increase to twice daily

## 2020-10-25 NOTE — Assessment & Plan Note (Signed)
Following with neurology. Doing well. Cont Amitritpline

## 2020-10-25 NOTE — Assessment & Plan Note (Signed)
HTN and HLD. Excellent control. Decrease metformin to 500 mg once daily and take BID if high sugars. Cont diet and weight loss. Return 6 months

## 2020-10-26 DIAGNOSIS — E118 Type 2 diabetes mellitus with unspecified complications: Secondary | ICD-10-CM | POA: Diagnosis not present

## 2020-10-26 LAB — MICROALBUMIN / CREATININE URINE RATIO
Creatinine,U: 148.2 mg/dL
Microalb Creat Ratio: 0.5 mg/g (ref 0.0–30.0)
Microalb, Ur: 0.8 mg/dL (ref 0.0–1.9)

## 2020-11-21 ENCOUNTER — Other Ambulatory Visit: Payer: Self-pay | Admitting: Obstetrics & Gynecology

## 2020-11-21 DIAGNOSIS — Z1231 Encounter for screening mammogram for malignant neoplasm of breast: Secondary | ICD-10-CM

## 2020-11-26 ENCOUNTER — Ambulatory Visit
Admission: RE | Admit: 2020-11-26 | Discharge: 2020-11-26 | Disposition: A | Discharge status: Home / Self Care | Payer: BC Managed Care – PPO | Source: Ambulatory Visit | Attending: Obstetrics & Gynecology | Admitting: Obstetrics & Gynecology

## 2020-11-26 ENCOUNTER — Other Ambulatory Visit: Payer: Self-pay

## 2020-11-26 DIAGNOSIS — Z1231 Encounter for screening mammogram for malignant neoplasm of breast: Secondary | ICD-10-CM | POA: Diagnosis not present

## 2020-11-28 DIAGNOSIS — Z01419 Encounter for gynecological examination (general) (routine) without abnormal findings: Secondary | ICD-10-CM | POA: Diagnosis not present

## 2020-11-28 DIAGNOSIS — Z6836 Body mass index (BMI) 36.0-36.9, adult: Secondary | ICD-10-CM | POA: Diagnosis not present

## 2020-11-29 ENCOUNTER — Ambulatory Visit: Payer: BC Managed Care – PPO | Admitting: Gastroenterology

## 2021-01-11 ENCOUNTER — Ambulatory Visit: Payer: BC Managed Care – PPO

## 2021-01-25 ENCOUNTER — Encounter: Payer: Self-pay | Admitting: Gastroenterology

## 2021-01-25 ENCOUNTER — Ambulatory Visit: Payer: BC Managed Care – PPO | Admitting: Gastroenterology

## 2021-01-25 VITALS — BP 128/80 | HR 69 | Ht 60.0 in | Wt 183.5 lb

## 2021-01-25 DIAGNOSIS — K219 Gastro-esophageal reflux disease without esophagitis: Secondary | ICD-10-CM

## 2021-01-25 DIAGNOSIS — K760 Fatty (change of) liver, not elsewhere classified: Secondary | ICD-10-CM | POA: Diagnosis not present

## 2021-01-25 DIAGNOSIS — R112 Nausea with vomiting, unspecified: Secondary | ICD-10-CM

## 2021-01-25 NOTE — Patient Instructions (Addendum)
It was a pleasure to see you today. Based on our discussion, I am providing you with my recommendations below:  RECOMMENDATION(S):   The following strategies help you prevent heartburn and other symptoms by avoiding foods that reduce the effectiveness of the bottom of the esophagus from protecting the esophagus from from acid injury and keeping stomach contents where they belong.  Eat smaller meals. A large meal remains in the stomach for several hours, increasing the chances for gastroesophageal reflux. Try distributing your daily food intake over three, four, or five smaller meals.  Relax when you eat. Stress increases the production of stomach acid, so make meals a pleasant, relaxing experience. Sit down. Eat slowly. Chew completely. Play soothing music.  Relax between meals. Relaxation therapies such as deep breathing, meditation, massage, tai chi, or yoga may help prevent and relieve heartburn.   Remain upright after eating. You should maintain postures that reduce the risk for reflux for at least three hours after eating. For example, don't bend over or strain to lift heavy objects.  Avoid eating within three hours of going to bed. Lying down after eating will increase chances of reflux.  Lose weight. Congratulation on your recent weight loss!!! Excess pounds increase pressure on the stomach and can push acid into the esophagus.  Loosen up. Avoid tight belts, waistbands, and other clothing that puts pressure on your stomach.  Avoid foods that burn. Abstain from food or drink that increases gastric acid secretion, decreases the valve at the bottom of the esophagus, or slows the emptying of the stomach. Known offenders include high-fat foods, spicy dishes, tomatoes and tomato products, citrus fruits, garlic, onions, milk, carbonated drinks, coffee (including decaf), tea, chocolate, mints, and alcohol.  Avoid medications that can predispose you to reflux including aspirin and other NSAIDs,  oral contraceptives, hormone therapy drugs, and certain antidepressants.  Sleep at an angle. If you're bothered by nighttime heartburn, place a wedge (available in medical supply stores or a wedge pillow through [employer]) under your upper body. But don't elevate your head with extra pillows. That makes reflux worse by bending you at the waist and compressing your stomach. You might also try sleeping on your left side, as studies have shown this can help--perhaps because the stomach is on the left side of the body, so lying on your left positions most of the stomach below the bottom of the esophagus.   BMI:  If you are age 66 or older, your body mass index should be between 23-30. Your There is no height or weight on file to calculate BMI. If this is out of the aforementioned range listed, please consider follow up with your Primary Care Provider.  If you are age 4 or younger, your body mass index should be between 19-25. Your There is no height or weight on file to calculate BMI. If this is out of the aformentioned range listed, please consider follow up with your Primary Care Provider.   MY CHART:  [facility] providers would like to encourage you to use [facility] to communicate with providers for non-urgent requests or questions.  Due to long hold times on the telephone, sending your provider a message by [facility] may be a faster and more efficient way to get a response.  Please allow 48 business hours for a response.  Please remember that this is for non-urgent requests.   FOLLOW UP:  I would like for you to follow up with me in 1 year. Please call the office  at [phone] to schedule your appointment.  Thank you for trusting me with your gastrointestinal care!    Thornton Park, MD, MPH

## 2021-01-25 NOTE — Progress Notes (Signed)
Referring Provider: Lesleigh Noe, MD Primary Care Physician:  Lesleigh Noe, MD  Chief complaint: Nausea and vomiting   IMPRESSION:  LA class a reflux esophagitis presenting with nausea, vomiting, and an isolated episode of dysphagia: Symptoms resolved with intentional 20 pound weight loss. She is not currently requiring medical therapy.   Fatty liver: Reviewed the ultrasound findings.Discussed the diagnosis and natural history of fatty liver. Discussed treatment options. Given her history of diabetes, consider staging for advanced liver disease.  She is also at higher risk for cardiac disease. I recommended the use of the UpToDate website for further information. Recommended abstinence from all beer, wine, liquor, and non-alcoholic beer  History of colon polyps: Tubular adenoma removed in 2021.  Surveillance colonoscopy recommended in 2028.   PLAN: - Reviewed lifestyle modifications - Pantoprazole PRN - Annual follow-up, earlier if needed - Surveillance colonoscopy in 2028, earlier with new symptoms  Please see the "Patient Instructions" section for addition details about the plan.  HPI: Linda [name] is a 60 y.o. female who returns in scheduled follow-up of GERD.  She has a history of hypertension, hyperlipidemia, and type 2 diabetes.  She was recently seen in the office by Carl Best for progressive reflux that occurred after she discontinued pantoprazole.  Symptoms were predominantly nausea, regurgitation, heartburn, and an isolated episode of dysphagia when eating chicken.  Upper endoscopy 08/17/2020 showed biopsy confirmed LA Class A reflux esophagitis and a Hill grade 2 hiatal hernia.  Esophageal biopsies were negative for eosinophilic esophagitis.  Symptoms are well controlled since she started a weight-management program with keto. She has lost over 20 pounds and has not needed any further medical treatment. She has had no further dysphagia.   Endoscopic  history: - EGD with Dr. Deatra Ina 07/20/2014: Esophagitis without Barrett's Esophagus - Colonoscopy with Dr. Deatra Ina in 2015 identified a small ascending colon polyp that was not removed - Colonoscopy 08/28/2019: 3 mm a sending colon tubular adenoma - EGD 08/17/2020: LA Class a reflux esophagitis and a Hill grade 2 hiatal hernia  Prior abdominal imaging: Abdominal ultrasound 07/29/2020: Fatty liver, otherwise normal  Normal platelets and Normal liver enzymes 07/20/20 with ALT 16   Past Medical History:  Diagnosis Date   Allergic rhinitis    Allergy    SEASONAL- pt states she has not had issues in "years"   Anemia    Cardiac murmur    1/6 SEM at [facility] (old finding per patient)- no heart murmur per Dr. Deborra Medina   Diabetes mellitus without complication ([place])    GERD (gastroesophageal reflux disease)    Hyperlipidemia    Hypertension    Neuralgic migraines    "silent migraines"   Obesity    Vertigo     Past Surgical History:  Procedure Laterality Date   COLONOSCOPY     CYST EXCISION N/A 02/09/2020   Procedure: EXCISION UPPER BACK CYST;  Surgeon: Coralie Keens, MD;  Location: [facility];  Service: General;  Laterality: N/A;   fibroid ablation     skin graft for burn     trigger thumb     UPPER GASTROINTESTINAL ENDOSCOPY      Current Outpatient Medications  Medication Sig Dispense Refill   amitriptyline (ELAVIL) 10 MG tablet TAKE 2 TABLETS BY MOUTH AT NIGHT 180 tablet 3   glucose blood test strip Onetouch Delica test strips; UAD to monitor glucose bid;Dx:E11.9 100 each 12   metFORMIN (GLUCOPHAGE-XR) 500 MG 24 hr tablet TAKE 2 TABLETS BY MOUTH  TWICE A DAY 360 tablet 1   OneTouch Delica Lancets 97D MISC UAD to monitor glucose bid;Dx E11.9 100 each 12   simvastatin (ZOCOR) 20 MG tablet Take 1 tablet (20 mg total) by mouth at bedtime. 90 tablet 3   Current Facility-Administered Medications  Medication Dose Route Frequency Provider Last Rate Last Admin   0.9 %  sodium  chloride infusion  500 mL Intravenous Once Thornton Park, MD        Allergies as of 01/25/2021 - Review Complete 01/25/2021  Allergen Reaction Noted   Morphine Itching 11/20/2011   Lisinopril Swelling 07/26/2020   Topamax [topiramate] Rash 06/26/2016    Family History  Problem Relation Age of Onset   Hypertension Mother    Early death Father        car accident   Diabetes Maternal Aunt    Renal Disease Maternal Aunt    Heart disease Maternal Grandmother    Heart disease Paternal Grandmother    Colon cancer Neg Hx    Stomach cancer Neg Hx    Pancreatic cancer Neg Hx    Esophageal cancer Neg Hx    Liver disease Neg Hx    Rectal cancer Neg Hx       Physical Exam: General:   Alert,  well-nourished, pleasant and cooperative in NAD Head:  Normocephalic and atraumatic. Eyes:  Sclera clear, no icterus.   Conjunctiva pink. Abdomen:  Soft, nontender, nondistended, normal bowel sounds, no rebound or guarding. No hepatosplenomegaly.   Neurologic:  Alert and  oriented x4;  grossly nonfocal Skin:  Intact without significant lesions or rashes. Psych:  Alert and cooperative. Normal mood and affect.     Keimya Briddell L. Tarri Glenn, MD, MPH 01/25/2021, 8:45 AM

## 2021-03-02 ENCOUNTER — Encounter: Payer: Self-pay | Admitting: Orthopaedic Surgery

## 2021-03-02 ENCOUNTER — Ambulatory Visit: Payer: BC Managed Care – PPO | Admitting: Orthopaedic Surgery

## 2021-03-02 ENCOUNTER — Ambulatory Visit: Payer: Self-pay

## 2021-03-02 VITALS — Ht 60.0 in | Wt 180.0 lb

## 2021-03-02 DIAGNOSIS — M25561 Pain in right knee: Secondary | ICD-10-CM

## 2021-03-02 NOTE — Progress Notes (Signed)
Office Visit Note   Patient: Linda [name]           Date of Birth: 1961/06/09           MRN: [id number] Visit Date: 03/02/2021              Requested by: Lesleigh Noe, MD [place],  [place] [address] PCP: Lesleigh Noe, MD   Assessment & Plan: Visit Diagnoses:  1. Acute pain of right knee     Plan: I gave her reassurance that there was no fracture.  She does have a prepatellar bursitis of the right knee and there is palpable type areas or calcifications around the bursa.  I would not do anything for these and just give it time with massaging the tissues.  She would benefit from trying some topical Voltaren gel as well.  Reassurance was given and all questions and concerns were answered and addressed.  Follow-up is as needed.  Follow-Up Instructions: Return if symptoms worsen or fail to improve.   Orders:  Orders Placed This Encounter  Procedures   XR Knee 1-2 Views Right   No orders of the defined types were placed in this encounter.     Procedures: No procedures performed   Clinical Data: No additional findings.   Subjective: Chief Complaint  Patient presents with   Right Knee - Pain  The patient is a very pleasant 60 year old female who comes in today for evaluation treatment of acute right knee pain.  She fell a month ago on cement landing directly on her right patella.  She is very tender over her patella on the right side.  She says anytime she puts pressure on her knee or gets down on it it does cause pain.  She denies any decrease in flexion or extension of that knee and has not had to take pain medications.  She is walking without a limp or an assistive device.  She does state that when she is on her knee she feels like she is on pebbles on her right kneecap.  This is when she puts pressure on it.  HPI  Review of Systems There is currently listed no headache, chest pain, shortness of breath, fever, chills, nausea,  vomiting  Objective: Vital Signs: Ht 5' (1.524 m)   Wt 180 lb (81.6 kg)   LMP 08/28/2015 (Approximate)   BMI 35.15 kg/m   Physical Exam She is alert and orient x3 and in no acute distress Ortho Exam Examination of her right knee shows the extensor mechanism is intact.  There is no swelling in the knee is stable on exam with full range of motion.  There is pain over the prepatellar bursa area and palpating the patella.  I can feel slight calcifications in the prepatellar bursa area but there is no redness or drainage and this is not big enough to aspirate.  The knee is ligamentously stable.  There is no knee joint effusion. Specialty Comments:  No specialty comments available.  Imaging: XR Knee 1-2 Views Right  Result Date: 03/02/2021 2 views of the right knee showed no acute findings.  The joint space is well-maintained and there is no evidence of a patella fracture.    PMFS History: Patient Active Problem List   Diagnosis Date Noted   Hiatal hernia 08/24/2020   Anemia 07/26/2020   Hypertension associated with diabetes ([place]) 12/07/2019   Acute pain of right shoulder 12/07/2019   Spasm of cervical paraspinous  muscle 08/06/2019   Colon polyp 03/20/2019   Headache disorder 07/30/2018   HLD (hyperlipidemia) 05/07/2018   Vertiginous migraine 01/25/2017   Vestibular migraine 06/12/2016   BPV (benign positional vertigo) 05/08/2016   Dizziness 01/16/2016   Esophageal reflux 04/21/2014   Dyspepsia 03/31/2014   Obesity 01/13/2013   Cardiac murmur 01/04/2012   Type 2 diabetes mellitus with other specified complication ([place]) 07/62/2633   GERD with esophagitis 10/09/2011   Allergic rhinitis, cause unspecified 12/19/2006   Past Medical History:  Diagnosis Date   Allergic rhinitis    Allergy    SEASONAL- pt states she has not had issues in "years"   Anemia    Cardiac murmur    1/6 SEM at [facility] (old finding per patient)- no heart murmur per Dr. Deborra Medina   Diabetes mellitus without  complication ([facility])    GERD (gastroesophageal reflux disease)    Hyperlipidemia    Hypertension    Neuralgic migraines    "silent migraines"   Obesity    Vertigo     Family History  Problem Relation Age of Onset   Hypertension Mother    Early death Father        car accident   Diabetes Maternal Aunt    Renal Disease Maternal Aunt    Heart disease Maternal Grandmother    Heart disease Paternal Grandmother    Colon cancer Neg Hx    Stomach cancer Neg Hx    Pancreatic cancer Neg Hx    Esophageal cancer Neg Hx    Liver disease Neg Hx    Rectal cancer Neg Hx     Past Surgical History:  Procedure Laterality Date   COLONOSCOPY     CYST EXCISION N/A 02/09/2020   Procedure: EXCISION UPPER BACK CYST;  Surgeon: Coralie Keens, MD;  Location: [facility];  Service: General;  Laterality: N/A;   fibroid ablation     skin graft for burn     trigger thumb     UPPER GASTROINTESTINAL ENDOSCOPY     Social History   Occupational History   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

## 2021-03-17 ENCOUNTER — Telehealth: Payer: Self-pay | Admitting: Family Medicine

## 2021-03-17 NOTE — Telephone Encounter (Signed)
Please schedule patient for 6 months follow up in September with Dr Einar Pheasant. Thank you

## 2021-03-17 NOTE — Telephone Encounter (Signed)
Spoke with patient scheduled follow up appointment

## 2021-04-24 IMAGING — US US RENAL ARTERY STENOSIS
1 series · 13 of 25 positions shown · non-contrast
Comparison: CT abdomen pelvis-10/31/2007

CLINICAL DATA: Fluctuating blood pressures. Evaluate for renal
artery stenosis.

EXAM:
RENAL/URINARY TRACT ULTRASOUND
RENAL DUPLEX DOPPLER ULTRASOUND

[Series 1: us renal artery stenosis · 0.30mm/px · 13 of 66 slices shown]
[im 1/66]
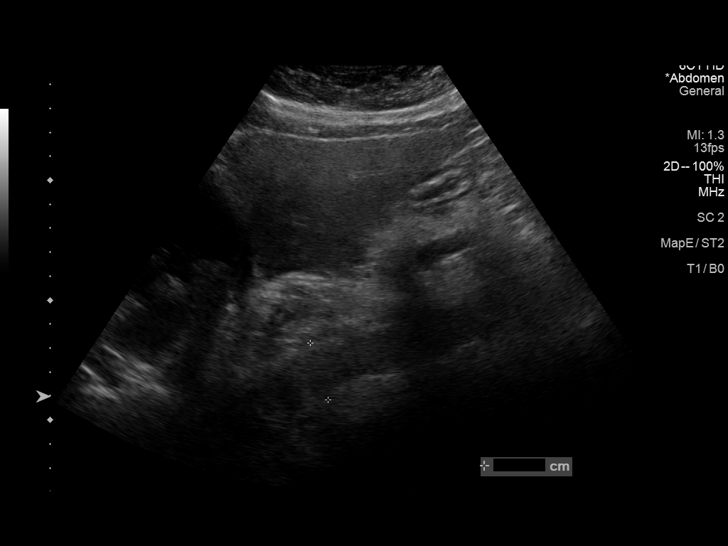
[im 6/66]
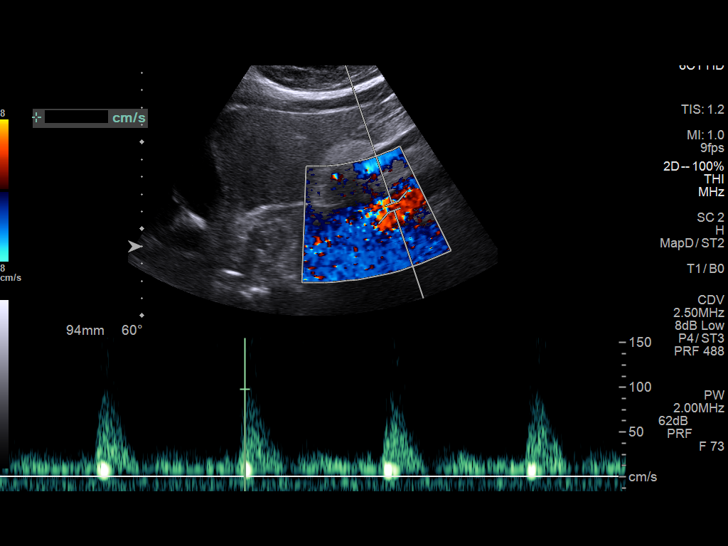
[im 11/66]
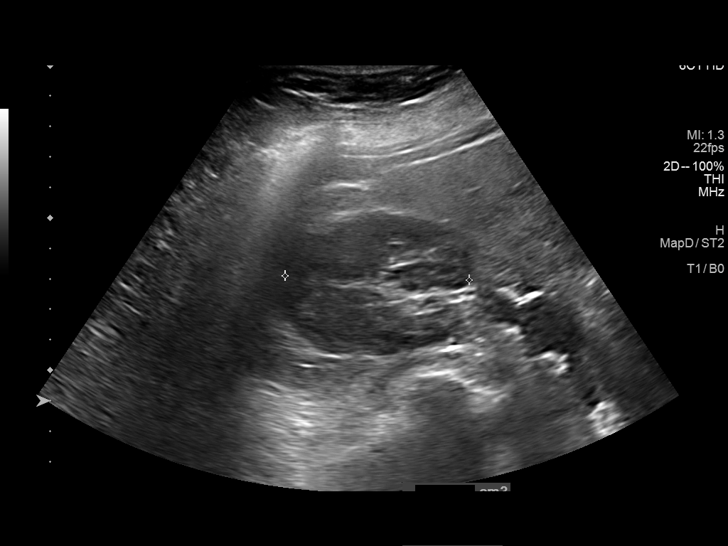
[im 17/66]
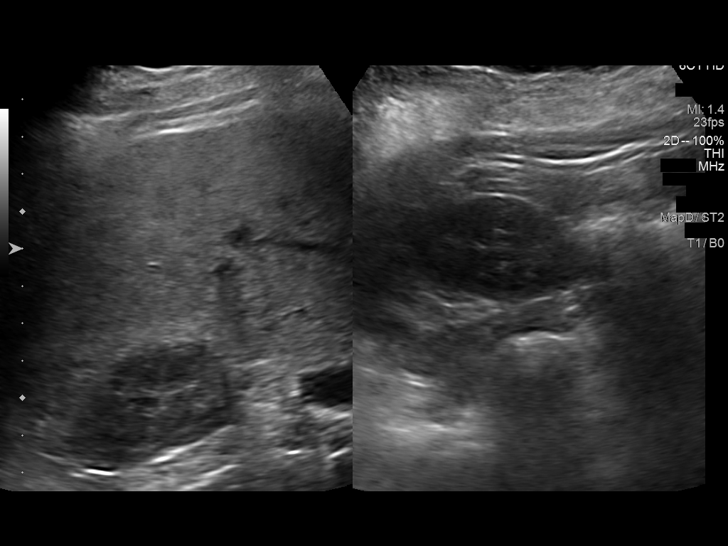
[im 22/66]
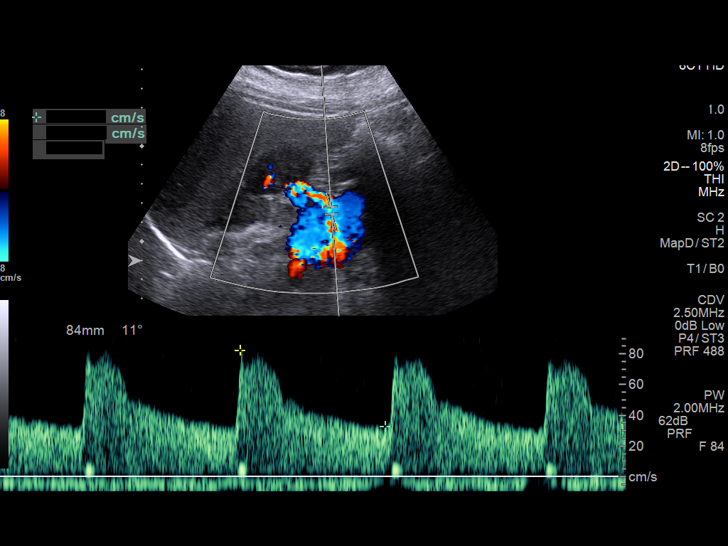
[im 28/66]
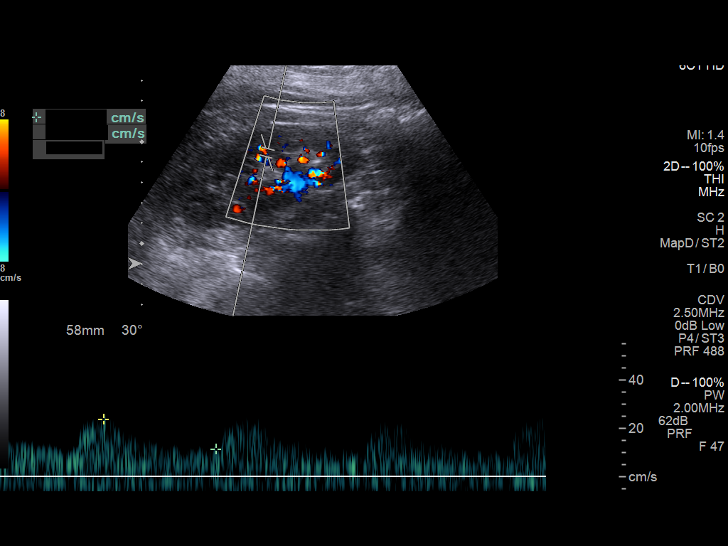
[im 33/66]
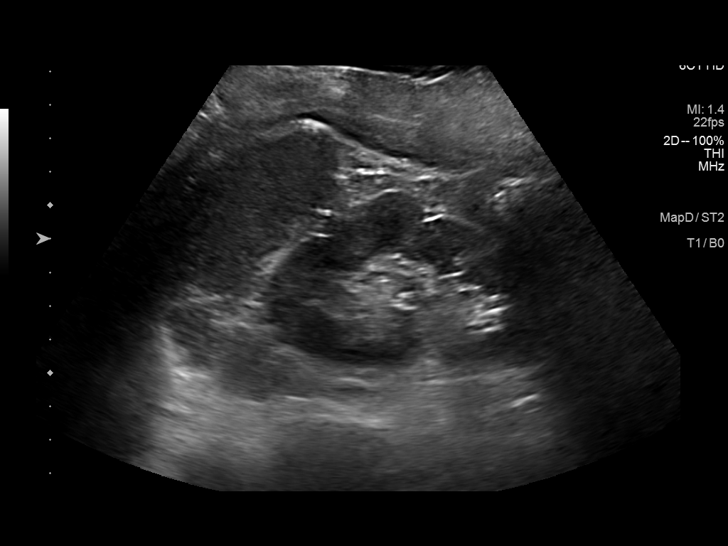
[im 38/66]
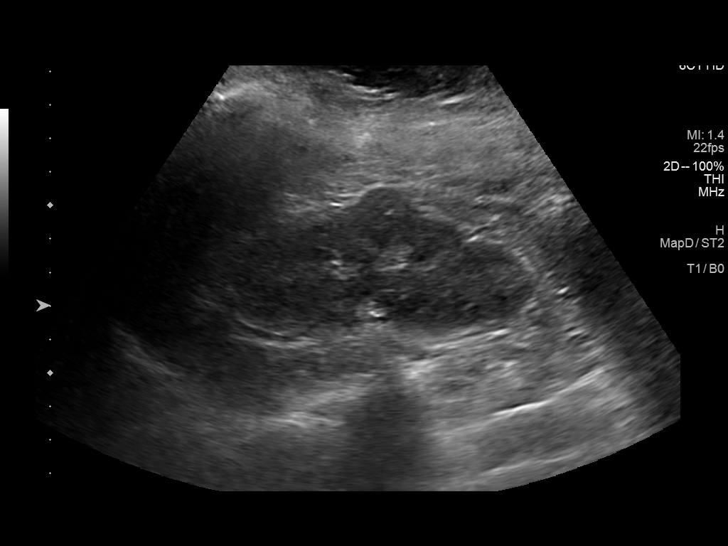
[im 44/66]
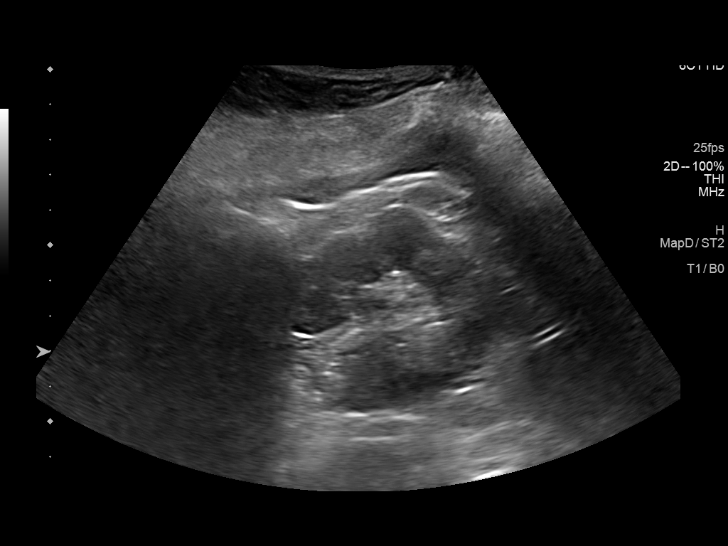
[im 49/66]
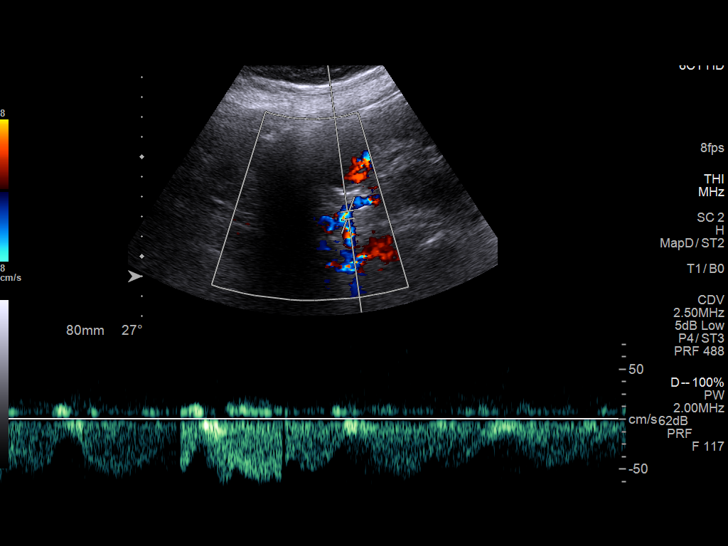
[im 55/66]
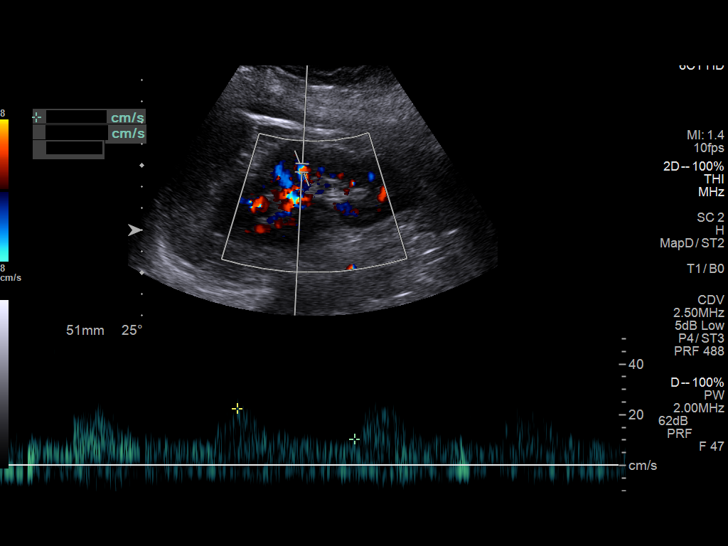
[im 60/66]
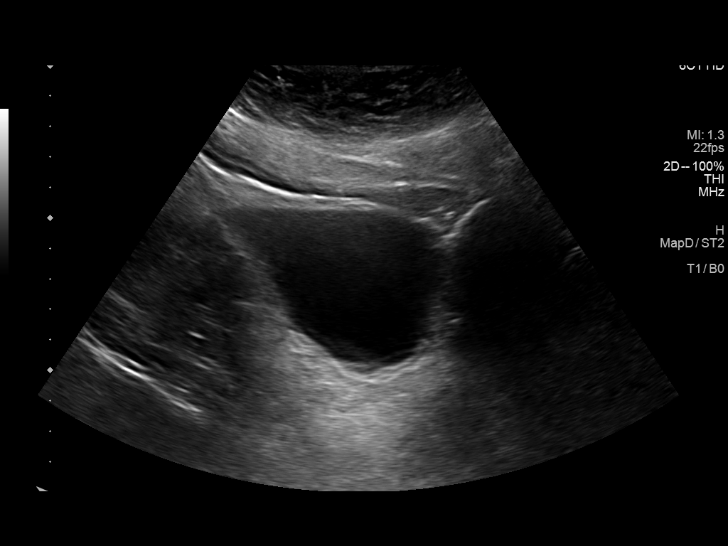
[im 66/66]
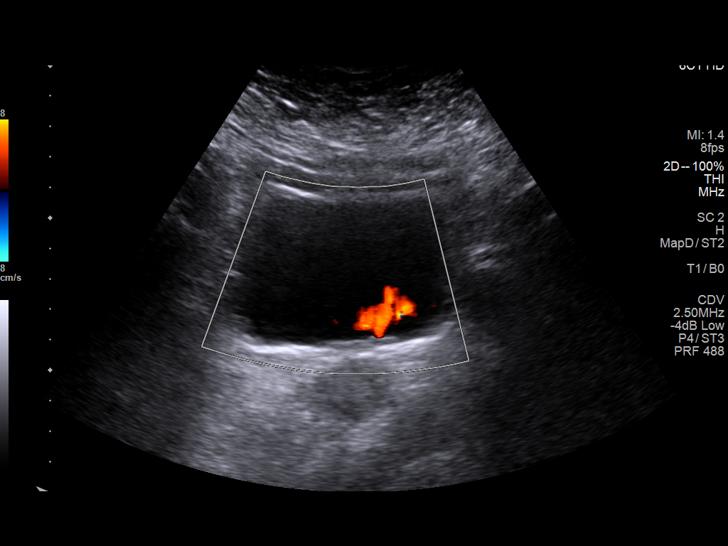

[13 of 25 positions shown; findings below may reference images not displayed]

FINDINGS: Right Kidney:

Normal cortical thickness, echogenicity and size, measuring 10.8 cm
in length. No focal renal lesions. No echogenic renal stones. No
urinary obstruction.

Left Kidney:

Normal cortical thickness, echogenicity and size, measuring 9.7 cm
in length. Note is made of a prominent renal column of Bertin. No
focal renal lesions. No echogenic renal stones. No urinary
obstruction.

Bladder: Normal sonographic appearance of the urinary bladder given
degree distention. Bilateral ureteral jets are identified.

_________________________________________________________

RENAL DUPLEX ULTRASOUND

Right Renal Artery Velocities:

Origin:  56 cm/sec

Mid:  82 cm/sec

Hilum:  81 cm/sec

Interlobar:  48 cm/sec

Arcuate:  27 cm/sec

Left Renal Artery Velocities:

Origin:  91 cm/sec

Mid:  58 cm/sec

Hilum:  63 cm/sec

Interlobar:  40 cm/sec

Arcuate:  25 cm/sec

Aortic Velocity:  98 cm/sec

Right Renal-Aortic Ratios:

Origin:

Mid:

Hilum:

Interlobar:

Arcuate:

Left Renal-Aortic Ratios:

Origin:

Mid:

Hilum:

Interlobar:

Arcuate:

Normal velocities and low resistance waveforms are demonstrated
throughout the interrogated portions of the bilateral renal arteries
and renal parenchyma. The bilateral renal veins appear patent.

Normal caliber of the abdominal aorta.
IMPRESSION: No explanation for patient's fluctuating blood pressure.
Specifically, no evidence of renal artery stenosis.

## 2021-05-22 ENCOUNTER — Encounter: Payer: Self-pay | Admitting: Family Medicine

## 2021-05-22 ENCOUNTER — Ambulatory Visit: Payer: BC Managed Care – PPO | Admitting: Family Medicine

## 2021-05-22 ENCOUNTER — Other Ambulatory Visit: Payer: Self-pay

## 2021-05-22 VITALS — BP 132/80 | HR 79 | Resp 16 | Ht 60.0 in | Wt 172.0 lb

## 2021-05-22 DIAGNOSIS — E669 Obesity, unspecified: Secondary | ICD-10-CM

## 2021-05-22 DIAGNOSIS — Z23 Encounter for immunization: Secondary | ICD-10-CM | POA: Diagnosis not present

## 2021-05-22 DIAGNOSIS — I152 Hypertension secondary to endocrine disorders: Secondary | ICD-10-CM

## 2021-05-22 DIAGNOSIS — E1159 Type 2 diabetes mellitus with other circulatory complications: Secondary | ICD-10-CM | POA: Diagnosis not present

## 2021-05-22 DIAGNOSIS — E785 Hyperlipidemia, unspecified: Secondary | ICD-10-CM | POA: Diagnosis not present

## 2021-05-22 DIAGNOSIS — Z6833 Body mass index (BMI) 33.0-33.9, adult: Secondary | ICD-10-CM

## 2021-05-22 DIAGNOSIS — E1169 Type 2 diabetes mellitus with other specified complication: Secondary | ICD-10-CM | POA: Diagnosis not present

## 2021-05-22 LAB — LIPID PANEL
Cholesterol: 206 mg/dL — ABNORMAL HIGH (ref 0–200)
HDL: 73.2 mg/dL (ref >39.00)
LDL Cholesterol: 122 mg/dL — ABNORMAL HIGH (ref 0–99)
NonHDL: 132.44
Total CHOL/HDL Ratio: 3
Triglycerides: 53 mg/dL (ref 0.0–149.0)
VLDL: 10.6 mg/dL (ref 0.0–40.0)

## 2021-05-22 LAB — POCT GLYCOSYLATED HEMOGLOBIN (HGB A1C): Hemoglobin A1C: 6.8 % — AB (ref 4.0–5.6)

## 2021-05-22 LAB — BASIC METABOLIC PANEL
BUN: 26 mg/dL — ABNORMAL HIGH (ref 6–23)
CO2: 27 mEq/L (ref 19–32)
Calcium: 9.7 mg/dL (ref 8.4–10.5)
Chloride: 103 mEq/L (ref 96–112)
Creatinine, Ser: 0.75 mg/dL (ref 0.40–1.20)
GFR: 87.89 mL/min (ref >60.00)
Glucose, Bld: 134 mg/dL — ABNORMAL HIGH (ref 70–99)
Potassium: 4.3 mEq/L (ref 3.5–5.1)
Sodium: 137 mEq/L (ref 135–145)

## 2021-05-22 NOTE — Patient Instructions (Addendum)
A few things to remember from today's visit:   Type 2 diabetes mellitus without complication, without long-term current use of insulin ([place]) - Plan: POC HgB I7O, Basic metabolic panel  Hyperlipidemia, unspecified hyperlipidemia type - Plan: Lipid panel  Hypertension associated with diabetes ([place])  If you need refills please call your pharmacy. Do not use My Chart to request refills or for acute issues that need immediate attention.   Continue monitoring blood pressure and bring readings with you next visit. Ozempic started today,,0.25 mg and double dose every 2-3 weeks as tolerated. Stop Metformin.  Please be sure medication list is accurate. If a new problem present, please set up appointment sooner than planned today.

## 2021-05-22 NOTE — Assessment & Plan Note (Signed)
Continue Simvastatin 20 mg daily and low fat diet. Further recommendations according to lipid panel results.

## 2021-05-22 NOTE — Progress Notes (Signed)
HPI: [name] is a 60 y.o. female, who is here today to establish care.  Former PCP: Dr Einar Pheasant. Last preventive routine visit: 11/28/20.  Chronic medical problems: DM 2, hypertension, hyperlipidemia, chronic headaches, GERD, and allergy rhinitis among some. Follows with neurologist for migraine headaches, she takes Amitriptyline daily prn.  Diabetes Mellitus II: Dx'ed in 09/2011. - Checking BG at home: 120's. - Medications: She takes Metformin XR 500 daily prn depending on what she is eating. - Diet: She is following a keto like diet, thought her health insurance. She has lost wt, she weighs daily. - Exercise: She is not consistent with a specific regimen but she is active at home. - eye exam: 01/2020.  - Negative for symptoms of hypoglycemia, polyuria, polydipsia, numbness extremities, foot ulcers/trauma  01/2021 HgA1C 6.9.  Lab Results  Component Value Date   MICROALBUR 0.8 10/25/2020   GERD: Asymptomatic since she changed her diet.  Hyperlipidemia: Currently on Simvastatin 20 mg daily. Following a low fat diet: Not consistently, following keto diet. Side effects from medication:None.  Lab Results  Component Value Date   CHOL 187 03/07/2020   HDL 60.60 03/07/2020   LDLCALC 103 (H) 03/07/2020   LDLDIRECT 118.9 07/05/2014   TRIG 116.0 03/07/2020   CHOLHDL 3 03/07/2020   She is concerned about elevated BP, 130-140's/80-90's. HTN on non pharmacologic treatment. She was on Lisinopril and she developed angioedema. Negative for severe/frequent headache, visual changes, chest pain, dyspnea, palpitation,  focal weakness, or edema. Lab Results  Component Value Date   CREATININE 0.74 07/20/2020   BUN 16 07/20/2020   NA 138 07/20/2020   K 4.1 07/20/2020   CL 103 07/20/2020   CO2 29 07/20/2020   Review of Systems  Constitutional:  Negative for chills and fever.  Respiratory:  Negative for choking and stridor.   Gastrointestinal:  Negative for abdominal pain,  nausea and vomiting.  Genitourinary:  Negative for decreased urine volume and hematuria.  Musculoskeletal:  Negative for gait problem.  Neurological:  Negative for syncope, facial asymmetry and weakness.  Rest see pertinent positives and negatives per HPI.  Current Outpatient Medications on File Prior to Visit  Medication Sig Dispense Refill   amitriptyline (ELAVIL) 10 MG tablet TAKE 2 TABLETS BY MOUTH AT NIGHT 180 tablet 3   glucose blood test strip Onetouch Delica test strips; UAD to monitor glucose bid;Dx:E11.9 100 each 12   OneTouch Delica Lancets 25D MISC UAD to monitor glucose bid;Dx E11.9 100 each 12   simvastatin (ZOCOR) 20 MG tablet TAKE 1 TABLET BY MOUTH EVERYDAY AT BEDTIME 90 tablet 3   Current Facility-Administered Medications on File Prior to Visit  Medication Dose Route Frequency Provider Last Rate Last Admin   0.9 %  sodium chloride infusion  500 mL Intravenous Once Thornton Park, MD       Past Medical History:  Diagnosis Date   Allergic rhinitis    Allergy    SEASONAL- pt states she has not had issues in "years"   Anemia    Cardiac murmur    1/6 SEM at [facility] (old finding per patient)- no heart murmur per Dr. Deborra Medina   Diabetes mellitus without complication ([place])    GERD (gastroesophageal reflux disease)    Hyperlipidemia    Hypertension    Neuralgic migraines    "silent migraines"   Obesity    Vertigo    Allergies  Allergen Reactions   Morphine Itching    Hydrocodone is OK   Lisinopril Swelling  Numbness and swelling of lips    Topamax [Topiramate] Rash    Family History  Problem Relation Age of Onset   Hypertension Mother    Early death Father        car accident   Diabetes Maternal Aunt    Renal Disease Maternal Aunt    Heart disease Maternal Grandmother    Heart disease Paternal Grandmother    Colon cancer Neg Hx    Stomach cancer Neg Hx    Pancreatic cancer Neg Hx    Esophageal cancer Neg Hx    Liver disease Neg Hx    Rectal cancer Neg Hx      Social History   Socioeconomic History   Marital status: Married    Spouse name: Charles   Number of children: 0   Years of education: Masters   Highest education level: Not on file  Occupational History   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  Other Topics Concern   Not on file  Social History Narrative   12/07/19   From: the area   Living: with husband, Journalist, newspaper   Work: Teacher, English as a foreign language at [employer]   Education: [employer] grad, [place] 2013      Family: Step son - Barbi Kumagai       Enjoys: sing - band shows      Exercise: walking on the treadmill    Diet: not doing great with this, trying to cut back on carb intake      Safety   Seat belts: Yes    Guns: Yes  and secure   Safe in relationships: Yes     left handed    Social Determinants of Health   Financial Resource Strain: Not on file  Food Insecurity: Not on file  Transportation Needs: Not on file  Physical Activity: Not on file  Stress: Not on file  Social Connections: Not on file   Vitals:   05/22/21 1407  BP: 132/80  Pulse: 79  Resp: 16  SpO2: 99%   Body mass index is 33.59 kg/m.  Physical Exam Vitals and nursing note reviewed.  Constitutional:      General: She is not in acute distress.    Appearance: She is well-developed.  HENT:     Head: Normocephalic and atraumatic.     Mouth/Throat:     Mouth: Mucous membranes are moist.     Pharynx: Oropharynx is clear.  Eyes:     Conjunctiva/sclera: Conjunctivae normal.  Cardiovascular:     Rate and Rhythm: Normal rate and regular rhythm.     Pulses:          Dorsalis pedis pulses are 2+ on the right side and 2+ on the left side.      Heart sounds: No murmur heard. Pulmonary:     Effort: Pulmonary effort is normal. No respiratory distress.     Breath sounds: Normal breath sounds.  Abdominal:     Palpations: Abdomen is soft. There is no hepatomegaly or mass.     Tenderness: There is no abdominal tenderness.  Lymphadenopathy:     Cervical: No cervical adenopathy.  Skin:  General: Skin is warm.     Findings: No erythema or rash.  Neurological:     General: No focal deficit present.     Mental Status: She is alert and oriented to person, place, and time.     Cranial Nerves: No cranial nerve deficit.     Gait: Gait normal.  Psychiatric:     Comments: Well groomed, good eye contact.   Diabetic Foot Exam - Simple   Simple Foot Form Diabetic Foot exam was performed with the following findings: Yes 05/22/2021  2:57 PM  Visual Inspection See comments: Yes Sensation Testing Intact to touch and monofilament testing bilaterally: Yes Pulse Check Posterior Tibialis and Dorsalis pulse intact bilaterally: Yes Comments Bunions, bilateral.    ASSESSMENT AND PLAN:  [name] was seen today for establish care.  Diagnoses and all orders for this visit: Orders Placed This Encounter  Procedures   Flu Vaccine QUAD 20mo+IM (Fluarix, Fluzone & Alfiuria Quad PF)   Varicella-zoster vaccine IM   Basic metabolic panel   Lipid panel   POC HgB A1c   Lab Results  Component Value Date   HGBA1C 6.8 (A) 05/22/2021   Lab Results  Component Value Date   CREATININE 0.75 05/22/2021   BUN 26 (H) 05/22/2021   NA 137 05/22/2021   K 4.3 05/22/2021   CL 103 05/22/2021   CO2 27 05/22/2021   Lab Results  Component Value Date   CHOL 206 (H) 05/22/2021   HDL 73.20 05/22/2021   LDLCALC 122 (H) 05/22/2021   LDLDIRECT 118.9 07/05/2014   TRIG 53.0 05/22/2021   CHOLHDL 3 05/22/2021    Type 2 diabetes mellitus with other specified complication ([place]) TFT7D at goal. Stop Metformin. She would like to try Ozempic, samples given.  Recommended starting 0.25 mg weekly and double dose q 2-3 weeks as tolerated to 1 mg weekly.  Annual eye exam, periodic dental and foot care recommended. F/U in 4 months   Hypertension associated with diabetes ([place]) BP today adequately controlled. Continue for now non pharmacologic treatment. Monitor BP regularly and bring BP readings next visit. Continue low salt/DASH diet.  HLD (hyperlipidemia) Continue Simvastatin 20 mg daily and low fat diet. Further recommendations according to lipid panel results.  Class 1 obesity with serious comorbidity and body mass index (BMI) of 33.0 to 33.9 in adult, unspecified obesity type She understands the benefits of wt loss as well as adverse effects of obesity. She is doing great, has lost wt since she made dietary changes. Consistency with healthy diet and physical activity encouraged.  Need for influenza vaccination -     Flu Vaccine QUAD 50mo+IM (Fluarix, Fluzone & Alfiuria Quad PF)  Need for shingles vaccine -     Varicella-zoster vaccine IM  I spent a total of 41 minutes in both face to face and non face to face activities for this visit on the date of this encounter. During this time history was obtained and documented, examination was performed, prior labs reviewed, and assessment/plan discussed.  Return in about 4 months (around 09/22/2021).  Linda Dickison G. [place], MD  [facility]. [place] office.

## 2021-05-22 NOTE — Assessment & Plan Note (Signed)
BP today adequately controlled. Continue for now non pharmacologic treatment. Monitor BP regularly and bring BP readings next visit. Continue low salt/DASH diet.

## 2021-05-22 NOTE — Assessment & Plan Note (Signed)
HgA1C at goal. Stop Metformin. She would like to try Ozempic, samples given. Recommended starting 0.25 mg weekly and double dose q 2-3 weeks as tolerated to 1 mg weekly.  Annual eye exam, periodic dental and foot care recommended. F/U in 4 months

## 2021-05-26 MED ORDER — ROSUVASTATIN CALCIUM 20 MG PO TABS: 20.0000 mg | ORAL_TABLET | Freq: Every day | ORAL | 1 refills | Status: DC

## 2021-05-26 NOTE — Addendum Note (Signed)
Addended by: Nilda Riggs on: 05/26/2021 01:48 PM   Modules accepted: Orders

## 2021-06-22 ENCOUNTER — Encounter: Payer: Self-pay | Admitting: Family Medicine

## 2021-06-23 MED ORDER — SEMAGLUTIDE (1 MG/DOSE) 4 MG/3ML ~~LOC~~ SOPN: 1.0000 mg | PEN_INJECTOR | SUBCUTANEOUS | 3 refills | Status: DC

## 2021-08-03 ENCOUNTER — Encounter: Payer: Self-pay | Admitting: Family Medicine

## 2021-08-03 IMAGING — MG DIGITAL SCREENING BILAT W/ CAD
4 series · 4 of 4 positions shown · non-contrast
Comparison: Previous exam(s).

CLINICAL DATA: Screening.

EXAM:
DIGITAL SCREENING BILATERAL MAMMOGRAM WITH CAD

[R MLO]
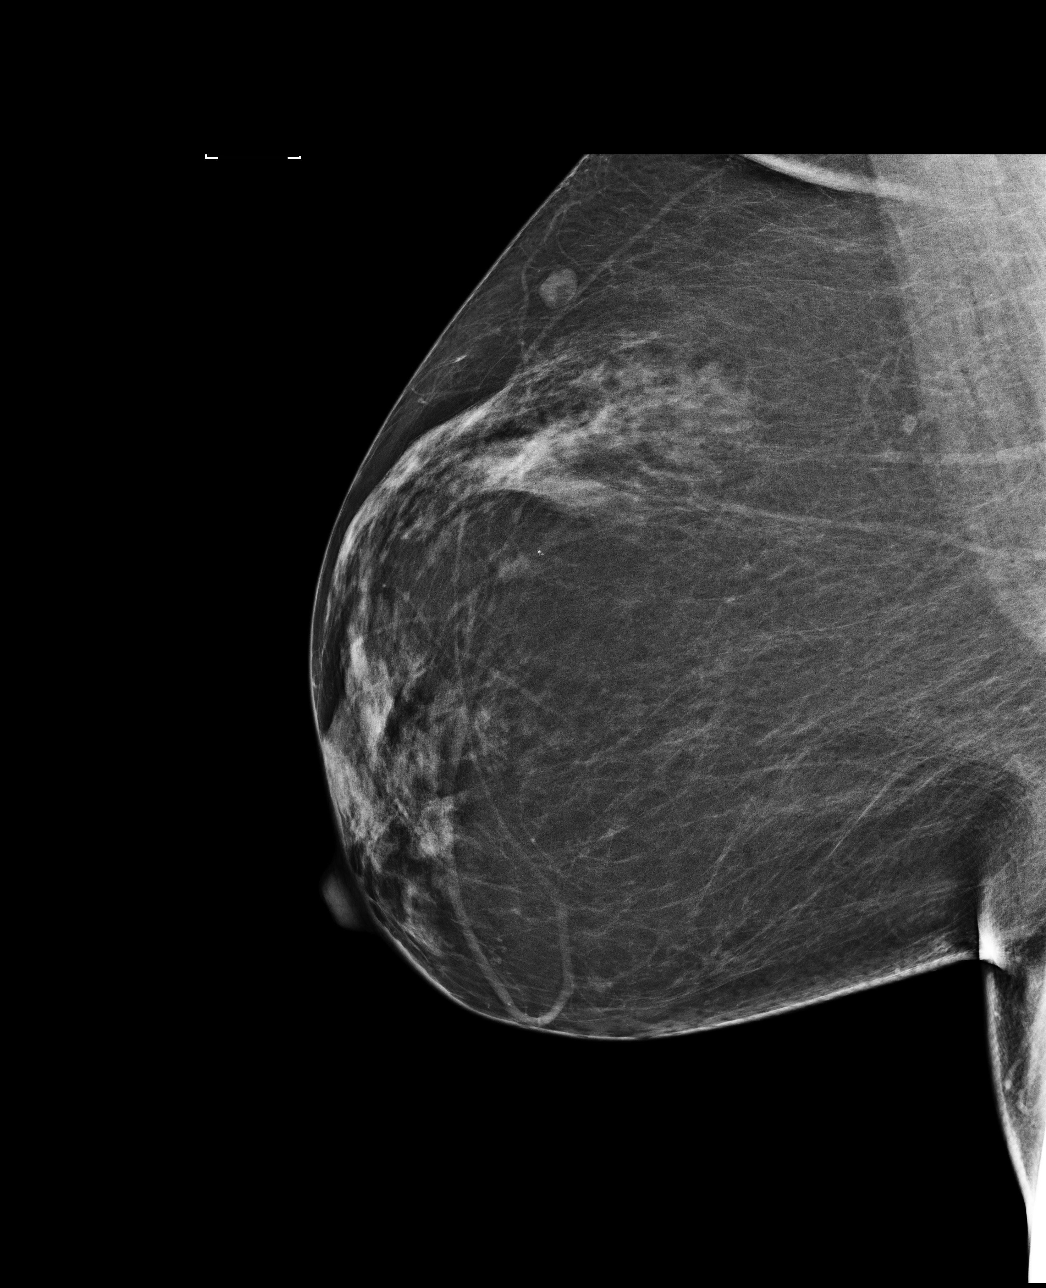

[R CC]
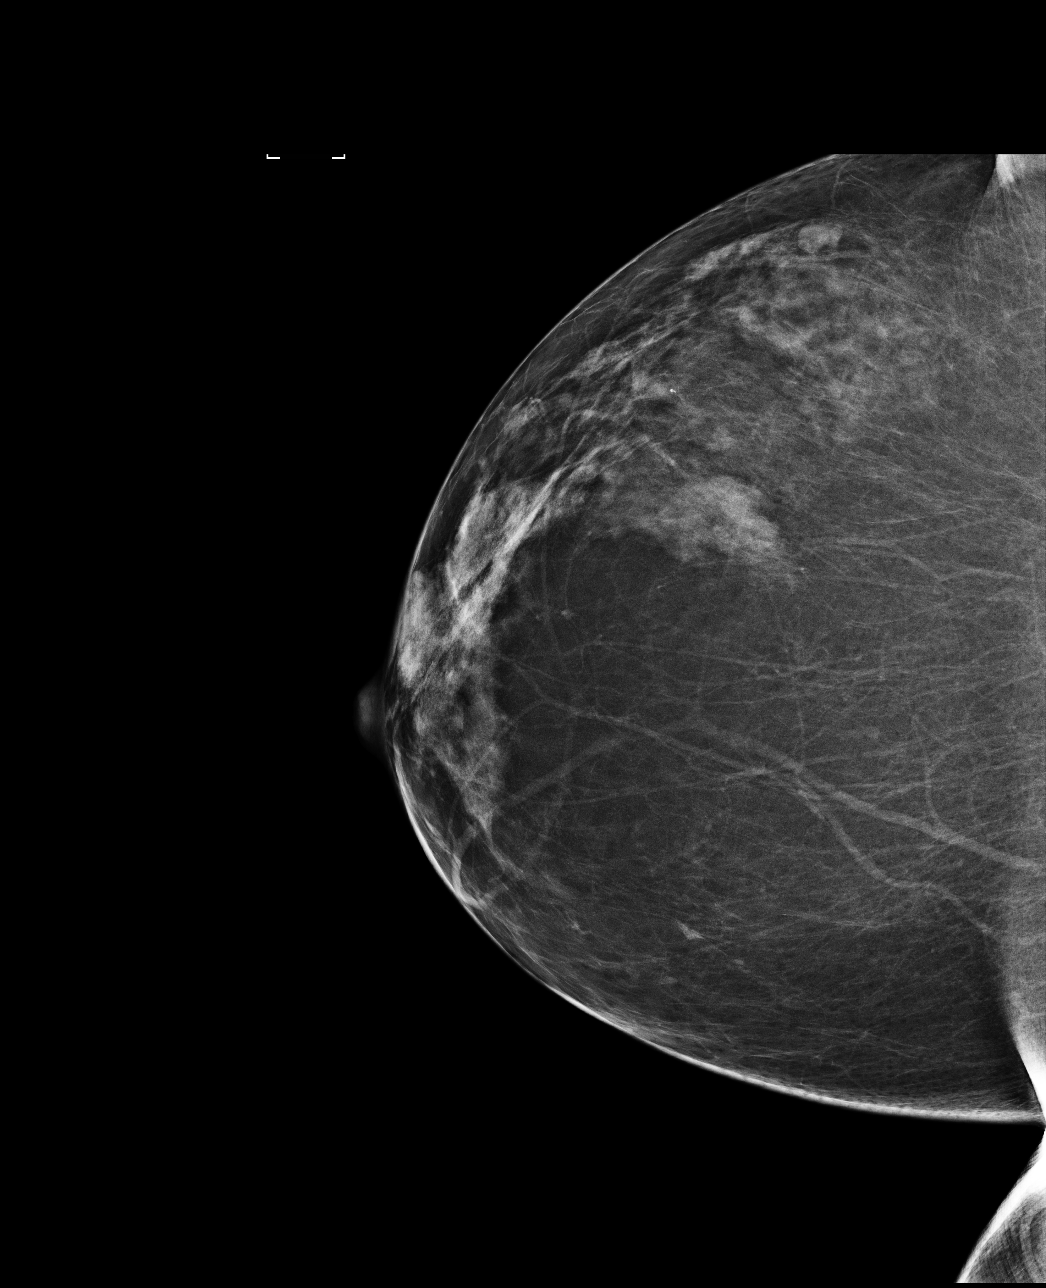

[L CC]
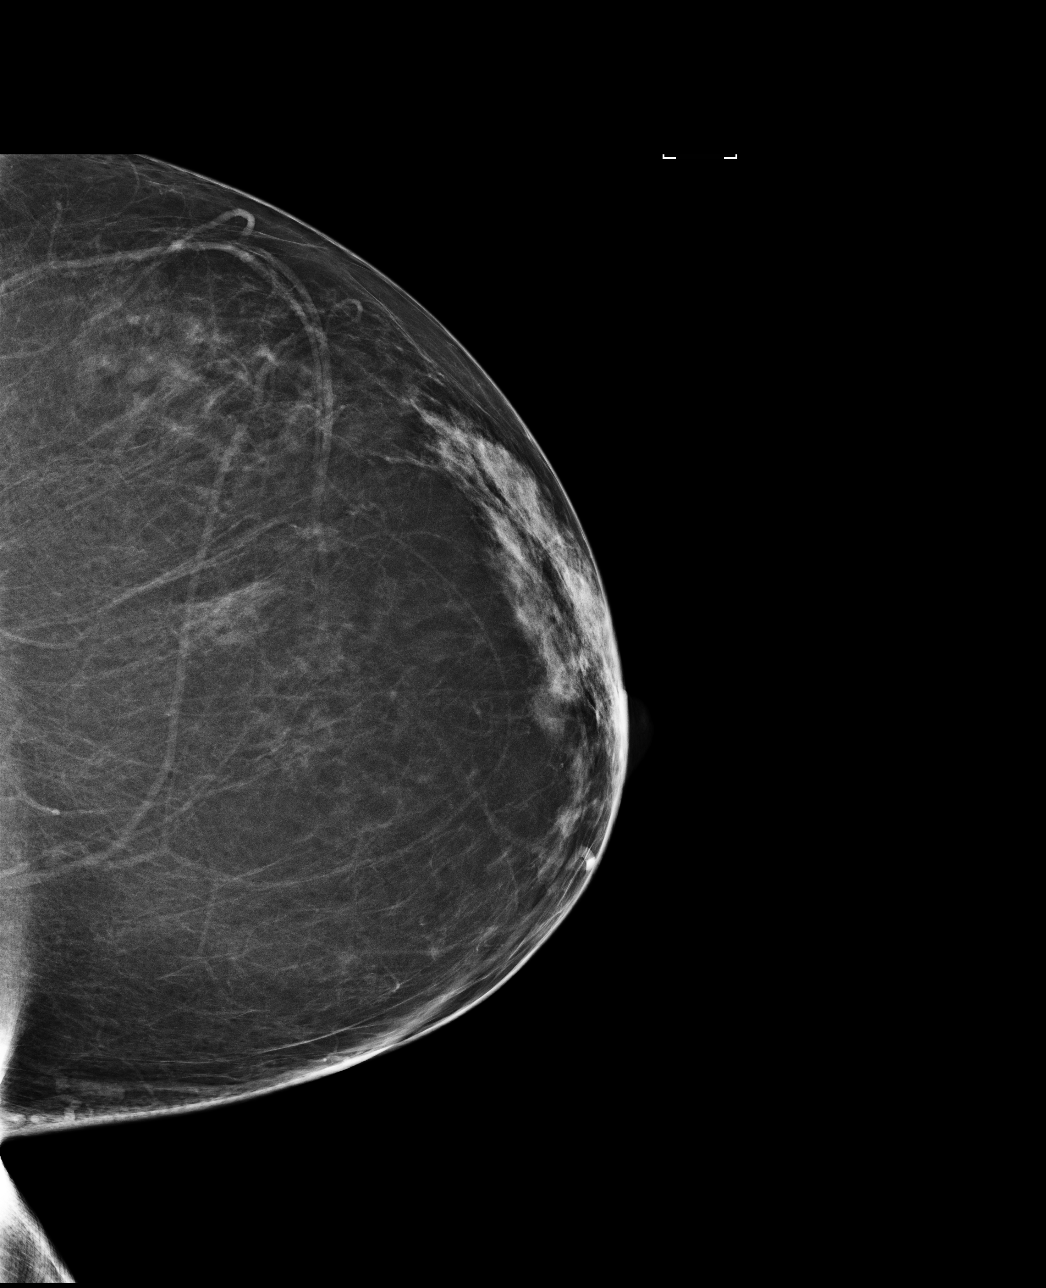

[L MLO]
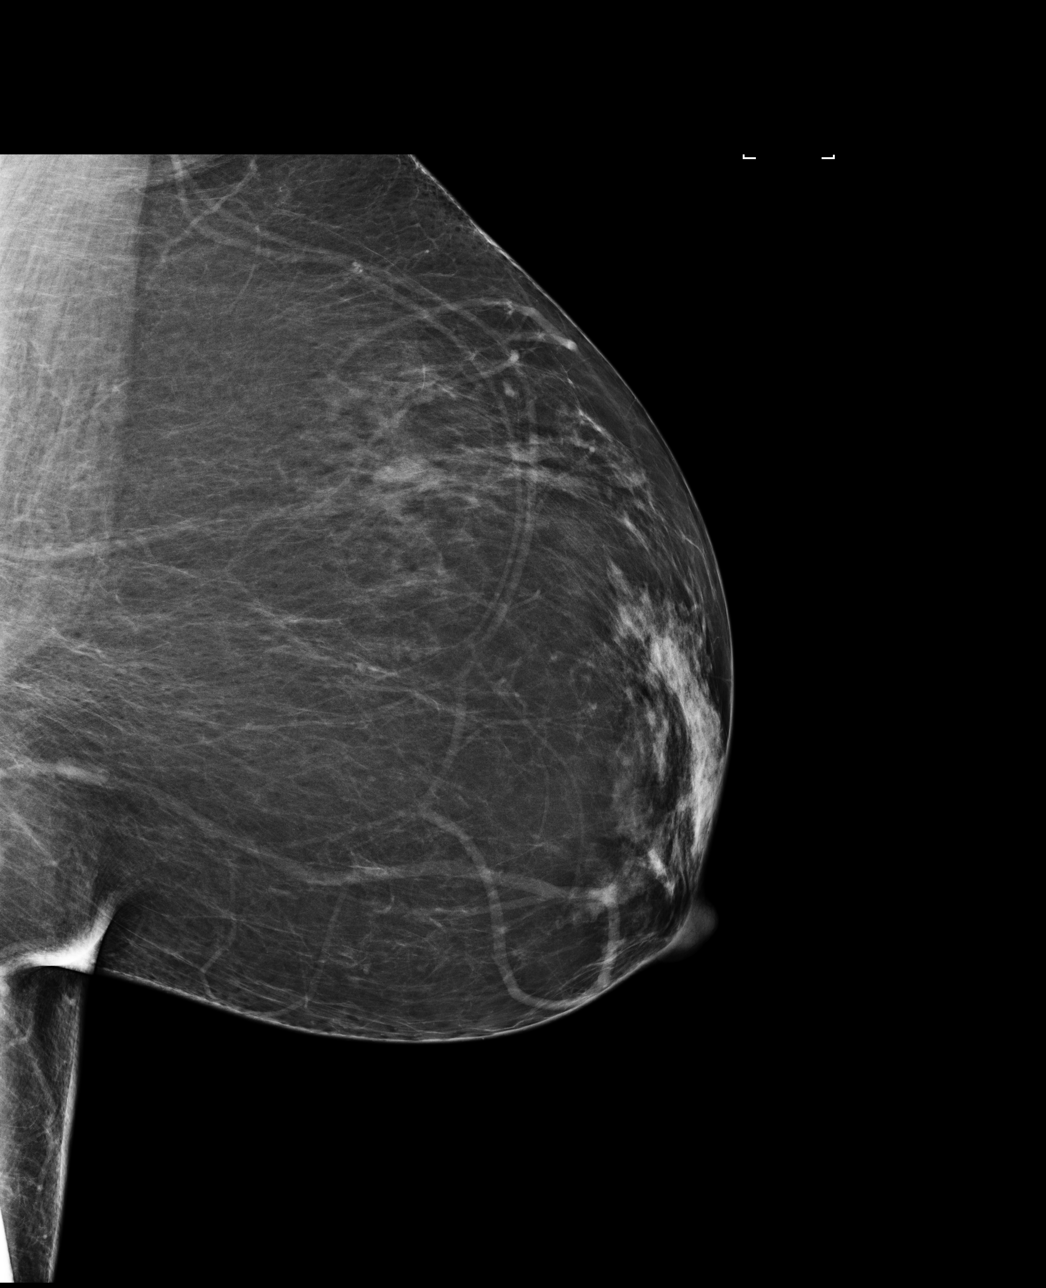

[4 of 4 positions shown; findings below may reference images not displayed]

ACR Breast Density Category b: There are scattered areas of
fibroglandular density.
FINDINGS: There are no findings suspicious for malignancy. Images were
processed with CAD.
IMPRESSION: No mammographic evidence of malignancy. A result letter of this
screening mammogram will be mailed directly to the patient.

RECOMMENDATION:
Screening mammogram in one year. (Code:AS-G-LCT)

BI-RADS CATEGORY  1: Negative.

## 2021-08-13 DIAGNOSIS — G5620 Lesion of ulnar nerve, unspecified upper limb: Secondary | ICD-10-CM

## 2021-08-13 DIAGNOSIS — G5600 Carpal tunnel syndrome, unspecified upper limb: Secondary | ICD-10-CM

## 2021-08-13 HISTORY — DX: Lesion of ulnar nerve, unspecified upper limb: G56.20

## 2021-08-13 HISTORY — DX: Carpal tunnel syndrome, unspecified upper limb: G56.00

## 2021-09-18 NOTE — Progress Notes (Signed)
HPI: Linda [name] is a 61 y.o. female, who is here today to follow on recent visit. Diabetes Mellitus II:  - Checking BG at home: 90-low 100's. - Medications: Ozempic 1 mg weekly. Occasionally she has mild nausea. - Compliance: [facility]. - Diet: Yes, she is following keto diet, still losing wt. She eats some fruit and vegetables. - Exercise: Non consistently. - eye exam: A couple days ago. - foot exam: 05/2021.  - Negative for symptoms of hypoglycemia, polyuria, polydipsia, numbness extremities, foot ulcers/trauma  Lab Results  Component Value Date   HGBA1C 6.8 (A) 05/22/2021   Lab Results  Component Value Date   MICROALBUR 0.8 10/25/2020   HTN on non pharmacologic treatment. Negative for severe/frequent headache, visual changes, chest pain, dyspnea, palpitation, claudication, focal weakness, or edema.  HLD: Last visit Simvastatin was changed to Crestor 20 mg. She has tolerated medication well. Component     Latest Ref Rng & Units 05/22/2021  Cholesterol     <200 mg/dL 206 (H)  Triglycerides     <150 mg/dL 53.0  HDL Cholesterol     > OR = 50 mg/dL 73.20  VLDL     0.0 - 40.0 mg/dL 10.6  LDL (calc)     0 - 99 mg/dL 122 (H)  Total CHOL/HDL Ratio     <5.0 (calc) 3  NonHDL      132.44   Review of Systems  Constitutional:  Negative for activity change, appetite change, fatigue and fever.  HENT:  Negative for mouth sores, nosebleeds and sore throat.   Respiratory:  Negative for cough and wheezing.   Gastrointestinal:  Negative for abdominal pain and vomiting.       Negative for changes in bowel habits.  Genitourinary:  Negative for decreased urine volume, dysuria and hematuria.  Musculoskeletal:  Negative for gait problem and myalgias.  Skin:  Negative for rash.  Neurological:  Negative for syncope and facial asymmetry.  Rest see pertinent positives and negatives per HPI.  Current Outpatient Medications on File Prior to Visit  Medication Sig Dispense Refill    amitriptyline (ELAVIL) 10 MG tablet TAKE 2 TABLETS BY MOUTH AT NIGHT 180 tablet 3   glucose blood test strip Onetouch Delica test strips; UAD to monitor glucose bid;Dx:E11.9 100 each 12   OneTouch Delica Lancets 59D MISC UAD to monitor glucose bid;Dx E11.9 100 each 12   rosuvastatin (CRESTOR) 20 MG tablet Take 1 tablet (20 mg total) by mouth daily. 90 tablet 1   Semaglutide, 1 MG/DOSE, 4 MG/3ML SOPN Inject 1 mg as directed once a week. 3 mL 3   Current Facility-Administered Medications on File Prior to Visit  Medication Dose Route Frequency Provider Last Rate Last Admin   0.9 %  sodium chloride infusion  500 mL Intravenous Once [place] Park, MD       Past Medical History:  Diagnosis Date   Allergic rhinitis    Allergy    SEASONAL- pt states she has not had issues in "years"   Anemia    Cardiac murmur    1/6 SEM at [facility] (old finding per patient)- no heart murmur per Dr. 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  Other Topics Concern   Not on file  Social History Narrative   12/07/19   From: the area    Living: with husband, Juanda Crumble   Work: Teacher, English as a foreign language at [employer]   Education: [employer] grad, [place] 2013      Family: Step son - Iyana Topor       Enjoys: sing - band shows      Exercise: walking on the treadmill    Diet: not doing great with this, trying to cut back on carb intake      Safety   Seat belts: Yes    Guns: Yes  and secure   Safe in relationships: Yes     left handed    Social Determinants of Health   Financial Resource Strain: Not on file  Food Insecurity: Not on file  Transportation Needs: Not on file  Physical Activity: Not on file  Stress: Not on file  Social Connections: Not on file   Vitals:   09/22/21 1406  BP: 132/76  Pulse: 75  Resp: 16  Temp: 98.4 F (36.9 C)  SpO2: 98%   Wt Readings from Last 3 Encounters:  09/22/21 166 lb 12.8 oz (75.7 kg)  05/22/21 172 lb (78 kg)  03/02/21 180 lb (81.6 kg)   Body mass index is 32.58 kg/m.  Physical Exam Vitals and nursing note reviewed.  Constitutional:      General: She is not in acute distress.    Appearance: She is well-developed.  HENT:     Head: Normocephalic and atraumatic.     Mouth/Throat:     Mouth: Mucous membranes are moist.     Pharynx: Oropharynx is clear.  Eyes:     Conjunctiva/sclera: Conjunctivae normal.  Cardiovascular:     Rate and Rhythm: Normal rate and regular rhythm.     Pulses:          Dorsalis pedis pulses are 2+ on the right side and 2+ on the left side.     Heart sounds: No murmur heard. Pulmonary:     Effort: Pulmonary effort is normal. No respiratory distress.     Breath sounds: Normal breath sounds.  Abdominal:     Palpations: Abdomen is soft. There is no hepatomegaly or mass.     Tenderness: There is no abdominal tenderness.  Lymphadenopathy:     Cervical: No cervical adenopathy.  Skin:    General: Skin is warm.     Findings: No erythema or rash.  Neurological:     General: No focal deficit present.     Mental Status: She is alert and oriented to  person, place, and time.     Cranial Nerves: No cranial nerve deficit.     Gait: Gait normal.  Psychiatric:     Comments:  Well groomed, good eye contact.   ASSESSMENT AND PLAN:  Ms. Kathryne was seen today for follow-up.  Diagnoses and all orders for this visit: Orders Placed This Encounter  Procedures   Varicella-zoster vaccine IM (Shingrix)   Basic metabolic panel   Lipid panel   Hepatic function panel   EXTRA LAV TOP TUBE   POC HgB A1c   Lab Results  Component Value Date   HGBA1C 6.0 (A) 09/22/2021   Lab Results  Component Value Date   CREATININE 0.79 09/22/2021   BUN 17 09/22/2021   NA 140 09/22/2021   K 4.4 09/22/2021   CL 105 09/22/2021   CO2 26 09/22/2021   Lab Results  Component Value Date   ALT 23 09/22/2021   AST 20 09/22/2021   ALKPHOS 100 07/20/2020   BILITOT 1.1 09/22/2021   Lab Results  Component Value Date   CHOL 141 09/22/2021   HDL 65 09/22/2021   LDLCALC 65 09/22/2021   LDLDIRECT 118.9 07/05/2014   TRIG 40 09/22/2021   CHOLHDL 2.2 09/22/2021   Type 2 diabetes mellitus with other specified complication ([place]) ZOX0R at goal, went down to 6,0 from 6.8. Because having mild nausea sometimes, she can decrease dose of Ozempic from 1 mg weekly to 0.5 mg. No changes in current management. Annual eye exam, periodic dental and foot care to continue. F/U in 5-6 months   HLD (hyperlipidemia) Continue Crestor 20 mg daily and low fat diet. Further recommendations according to FLP results.  Hypertension associated with diabetes ([place]) Overall BP is still adequately controlled. Continue non pharmacologic treatment: Low salt diet. Eye exam is current.  Class 1 obesity with serious comorbidity and body mass index (BMI) of 32.0 to 32.9 in adult, unspecified obesity type She has lost about 6 Lb since her last visit. She understands the benefits of wt loss as well as adverse effects of obesity. Consistency with healthy diet and physical activity  encouraged. Caution with keto diet, we discussed possible pros and cons.  Return in about 6 months (around 03/22/2022).  Marshal Eskew G. Martinique, MD  [facility]. [place] office.

## 2021-09-22 ENCOUNTER — Ambulatory Visit: Payer: BC Managed Care – PPO | Admitting: Family Medicine

## 2021-09-22 ENCOUNTER — Encounter: Payer: Self-pay | Admitting: Family Medicine

## 2021-09-22 VITALS — BP 132/76 | HR 75 | Temp 98.4°F | Resp 16 | Ht 60.0 in | Wt 166.8 lb

## 2021-09-22 DIAGNOSIS — E785 Hyperlipidemia, unspecified: Secondary | ICD-10-CM | POA: Diagnosis not present

## 2021-09-22 DIAGNOSIS — E1169 Type 2 diabetes mellitus with other specified complication: Secondary | ICD-10-CM

## 2021-09-22 DIAGNOSIS — E1159 Type 2 diabetes mellitus with other circulatory complications: Secondary | ICD-10-CM

## 2021-09-22 DIAGNOSIS — E669 Obesity, unspecified: Secondary | ICD-10-CM | POA: Diagnosis not present

## 2021-09-22 DIAGNOSIS — I152 Hypertension secondary to endocrine disorders: Secondary | ICD-10-CM

## 2021-09-22 DIAGNOSIS — Z23 Encounter for immunization: Secondary | ICD-10-CM

## 2021-09-22 DIAGNOSIS — Z6832 Body mass index (BMI) 32.0-32.9, adult: Secondary | ICD-10-CM

## 2021-09-22 DIAGNOSIS — E66811 Obesity, class 1: Secondary | ICD-10-CM

## 2021-09-22 LAB — POCT GLYCOSYLATED HEMOGLOBIN (HGB A1C): Hemoglobin A1C: 6 % — AB (ref 4.0–5.6)

## 2021-09-22 NOTE — Patient Instructions (Addendum)
A few things to remember from today's visit:  Type 2 diabetes mellitus with other specified complication, without long-term current use of insulin (Linda [name]) - Plan: POC HgB J2J, Basic metabolic panel  Hyperlipidemia, unspecified hyperlipidemia type - Plan: Hepatic function panel, Lipid panel, Basic metabolic panel  If you need refills please call your pharmacy. Do not use My Chart to request refills or for acute issues that need immediate attention.   Let's try decreasing dose of Ozempic from 1 mg weekly to 0.5 mg daily.  Please be sure medication list is accurate. If a new problem present, please set up appointment sooner than planned today. Continue the good work!!!

## 2021-09-22 NOTE — Assessment & Plan Note (Signed)
Continue Crestor 20 mg daily and low fat diet. Further recommendations according to FLP results. 

## 2021-09-22 NOTE — Assessment & Plan Note (Signed)
Overall BP is still adequately controlled. Continue non pharmacologic treatment: Low salt diet. Eye exam is current.

## 2021-09-22 NOTE — Assessment & Plan Note (Signed)
HgA1C at goal, went down to 6,0 from 6.8. Because having mild nausea sometimes, she can decrease dose of Ozempic from 1 mg weekly to 0.5 mg. No changes in current management. Annual eye exam, periodic dental and foot care to continue. F/U in 5-6 months

## 2021-09-23 LAB — BASIC METABOLIC PANEL
BUN: 17 mg/dL (ref 7–25)
CO2: 26 mmol/L (ref 20–32)
Calcium: 9.4 mg/dL (ref 8.6–10.4)
Chloride: 105 mmol/L (ref 98–110)
Creat: 0.79 mg/dL (ref 0.50–1.03)
Glucose, Bld: 87 mg/dL (ref 65–99)
Potassium: 4.4 mmol/L (ref 3.5–5.3)
Sodium: 140 mmol/L (ref 135–146)

## 2021-09-23 LAB — LIPID PANEL
Cholesterol: 141 mg/dL (ref <200)
HDL: 65 mg/dL (ref >=50)
LDL Cholesterol (Calc): 65 mg/dL (calc)
Non-HDL Cholesterol (Calc): 76 mg/dL (calc) (ref <130)
Total CHOL/HDL Ratio: 2.2 (calc) (ref <5.0)
Triglycerides: 40 mg/dL (ref <150)

## 2021-09-23 LAB — HEPATIC FUNCTION PANEL
AG Ratio: 1.4 (calc) (ref 1.0–2.5)
ALT: 23 U/L (ref 6–29)
AST: 20 U/L (ref 10–35)
Albumin: 4.3 g/dL (ref 3.6–5.1)
Alkaline phosphatase (APISO): 105 U/L (ref 37–153)
Bilirubin, Direct: 0.2 mg/dL (ref 0.0–0.2)
Globulin: 3 g/dL (calc) (ref 1.9–3.7)
Indirect Bilirubin: 0.9 mg/dL (calc) (ref 0.2–1.2)
Total Bilirubin: 1.1 mg/dL (ref 0.2–1.2)
Total Protein: 7.3 g/dL (ref 6.1–8.1)

## 2021-09-23 LAB — EXTRA LAV TOP TUBE

## 2021-09-24 MED ORDER — ROSUVASTATIN CALCIUM 20 MG PO TABS: 20.0000 mg | ORAL_TABLET | Freq: Every day | ORAL | 2 refills | Status: DC

## 2021-09-28 DIAGNOSIS — E118 Type 2 diabetes mellitus with unspecified complications: Secondary | ICD-10-CM | POA: Diagnosis not present

## 2021-10-05 ENCOUNTER — Ambulatory Visit: Payer: BC Managed Care – PPO | Admitting: Family Medicine

## 2021-10-06 ENCOUNTER — Ambulatory Visit: Payer: BC Managed Care – PPO | Admitting: Neurology

## 2021-10-06 ENCOUNTER — Other Ambulatory Visit: Payer: Self-pay

## 2021-10-06 ENCOUNTER — Encounter: Payer: Self-pay | Admitting: Neurology

## 2021-10-06 VITALS — BP 147/84 | HR 79 | Ht 60.0 in | Wt 168.4 lb

## 2021-10-06 DIAGNOSIS — G43109 Migraine with aura, not intractable, without status migrainosus: Secondary | ICD-10-CM | POA: Diagnosis not present

## 2021-10-06 NOTE — Patient Instructions (Signed)
Always good to see you doing well. Let me know when you need refills for amitriptyline. Follow-up in 1 year, call for any changes.

## 2021-10-06 NOTE — Progress Notes (Signed)
NEUROLOGY FOLLOW UP OFFICE NOTE  SHERLYN EBBERT [id number] [birth year]  HISTORY OF PRESENT ILLNESS: I had the pleasure of seeing Edessa Jakubowicz in follow-up in the neurology clinic on 10/06/2021.  The patient was last seen a year ago for dizziness, felt secondary to migraines. MRI brain normal. She has been to [facility] and [facility] for second opinions. [facility] felt symptoms likely migraine-related dizziness. [facility] noted vestibular migraines is a diagnosis of exclusion and wondered if hypertensive urgency could be causing some of her symptoms. Over the past year, she reports doing really, really well. She feels definitely much better than a year ago. She takes the amitriptyline 10mg  2-3 times a week to keep it in her system, preventing further dizzy spells where she has to pull off the road. She has been driving fine, the only thing she can tell sometimes at night is that headlights would have a halo around them but would not affect her negatively. She denies any side effects on amitriptyline. No headaches, vision changes, focal numbness/tingling/weakness, no falls. Sleep is good.    History on Initial Assessment 03/16/2016: This is a pleasant 61 yo RH woman with a history of diabetes, GERD, with dizziness. The symptoms started on 01/06/16 while she was driving home when she started feeling as if she would pass out, dizzy with a spinning sensation. She pulled over and called her husband, but was able to eventually drive home. She noticed she would veer to the right when walking. She saw her PCP and was noted to have nystagmus with Dix-Hallpike testing and impacted cerumen on the left. She returned with ear fullness on the right and again had cerumen disimpaction. She continued to have the dizziness, improved with meclizine, but this caused drowsiness. She underwent vestibular therapy with significant improvement in the dizziness. The spinning sensation has resolved, however she continues to  have a residual "floating" sensation particularly noticeable when she is driving. This would be associated with nausea, no vomiting. If she is driving and staying focused forward, she starts feeling "not normal," her ears will pop, "almost like a pressure," then this would be relieved when she looks off to the side. She constantly feels that there is a glare in her vision, more at night, when things do not seem clear. She had an eye exam which was normal. She has seen ENT and has been told she may have had a viral illness, given a steroid injection last June. She reports a history of herpes simplex from many years ago, when stressed out she feels a "nerve pain" on her arms without any visible lesions. She denies any associated headaches, focal numbness/tingling/weakness. She reports that 3 months prior to the onset of these symptoms, she noticed she would feel lightheaded in the morning while driving. No diplopia, dysarthria/dysphagia, bowel/bladder dysfunction. She has some left-sided neck pain that improved with PT. She denies any head injuries or recent infections. She had been taking Phentermine for weight loss, but stopped it in May when she started having the neck pain. She denies any daytime drowsiness, sleep is good.   PAST MEDICAL HISTORY: Past Medical History:  Diagnosis Date   Allergic rhinitis    Allergy    SEASONAL- pt states she has not had issues in "years"   Anemia    Cardiac murmur    1/6 SEM at [facility] (old finding per patient)- no heart murmur per Dr. Deborra Medina   Diabetes mellitus without complication ([place])    GERD (gastroesophageal  reflux disease)    Hyperlipidemia    Hypertension    Neuralgic migraines    "silent migraines"   Obesity    Vertigo     MEDICATIONS: Current Outpatient Medications on File Prior to Visit  Medication Sig Dispense Refill   amitriptyline (ELAVIL) 10 MG tablet TAKE 2 TABLETS BY MOUTH AT NIGHT 180 tablet 3   glucose blood test strip Onetouch Delica test  strips; UAD to monitor glucose bid;Dx:E11.9 100 each 12   OneTouch Delica Lancets 67T MISC UAD to monitor glucose bid;Dx E11.9 100 each 12   rosuvastatin (CRESTOR) 20 MG tablet Take 1 tablet (20 mg total) by mouth daily. 90 tablet 2   Semaglutide, 1 MG/DOSE, 4 MG/3ML SOPN Inject 1 mg as directed once a week. 3 mL 3   Current Facility-Administered Medications on File Prior to Visit  Medication Dose Route Frequency Provider Last Rate Last Admin   0.9 %  sodium chloride infusion  500 mL Intravenous Once Thornton Park, MD        ALLERGIES: Allergies  Allergen Reactions   Morphine Itching    Hydrocodone is OK   Lisinopril Swelling    Numbness and swelling of lips    Topamax [Topiramate] Rash    FAMILY HISTORY: Family History  Problem Relation Age of Onset   Hypertension Mother    Early death Father        car accident   Diabetes Maternal Aunt    Renal Disease Maternal Aunt    Heart disease Maternal Grandmother    Heart disease Paternal Grandmother    Colon cancer Neg Hx    Stomach cancer Neg Hx    Pancreatic cancer Neg Hx    Esophageal cancer Neg Hx    Liver disease Neg Hx    Rectal cancer Neg Hx     SOCIAL HISTORY: Social History   Socioeconomic History   Marital status: Married    Spouse name: Charles   Number of children: 0   Years of education: Masters   Highest education level: Not on file  Occupational History   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  Other Topics Concern   Not on file  Social History Narrative   12/07/19   From: the area   Living: with husband, Juanda Crumble   Work: Teacher, English as a foreign language at [employer]   Education: [employer] grad, [place]      Family: Step son -  Mariko Nowakowski       Enjoys: sing - band shows      Exercise: walking on the treadmill    Diet: not doing great with this, trying to cut back on carb intake      Safety   Seat belts: Yes    Guns: Yes  and secure   Safe in relationships: Yes     left handed    Social Determinants of Health   Financial Resource Strain: Not on file  Food Insecurity: Not on file  Transportation Needs: Not on file  Physical Activity: Not on file  Stress: Not on file  Social Connections: Not on file  Intimate Partner Violence: Not on file  PHYSICAL EXAM: Vitals:   10/06/21 1336  BP: (!) 147/84  Pulse: 79  SpO2: 98%   General: No acute distress Head:  Normocephalic/atraumatic Skin/Extremities: No rash, no edema Neurological Exam: alert and awake. No aphasia or dysarthria. Fund of knowledge is appropriate.  Attention and concentration are normal.   Cranial nerves: Pupils equal, round. Extraocular movements intact with no nystagmus. Visual fields full.  No facial asymmetry.  Motor: Bulk and tone normal, muscle strength 5/5 throughout with no pronator drift.   Finger to nose testing intact.  Gait narrow-based and steady, able to tandem walk adequately.  Romberg negative.   IMPRESSION: This is a pleasant 61 yo RH woman with a history of diabetes, GERD who presented with dizziness that started in May 2017. The dizziness was initially vertiginous with spinning sensation, that resolved with vestibular therapy, however she continued to have a residual "floating" sensation when driving, with associated nausea. Exam and brain MRI normal. She underwent vestibular testing and an audiogram at [facility] with normal findings, no evidence of peripheral or central vestibular dysfunction, and symptoms were felt to be due to migraine-associated dizziness. A second opinion from [facility] indicated the possibility of BP-related symptoms. She has been on amitriptyline 10mg  as a migraine preventative, taking it 2-3 times  a week and doing very well with significant improvement in symptoms. She will let us know when she needs refills. Follow-up in 1 year, call for any changes.    Thank you for allowing me to participate in her care.  Please do not hesitate to call for any questions or concerns.    Ellouise Newer, M.D.   CC: Dr. Martinique

## 2021-10-12 ENCOUNTER — Encounter: Payer: Self-pay | Admitting: Family Medicine

## 2021-10-13 ENCOUNTER — Telehealth: Payer: BC Managed Care – PPO | Admitting: Physician Assistant

## 2021-10-13 ENCOUNTER — Other Ambulatory Visit: Payer: Self-pay | Admitting: Family Medicine

## 2021-10-13 DIAGNOSIS — U071 COVID-19: Secondary | ICD-10-CM

## 2021-10-13 MED ORDER — MOLNUPIRAVIR EUA 200MG CAPSULE: 4.0000 | ORAL_CAPSULE | Freq: Two times a day (BID) | ORAL | 0 refills | Status: AC

## 2021-10-13 MED ORDER — ONDANSETRON 4 MG PO TBDP: 4.0000 mg | ORAL_TABLET | Freq: Three times a day (TID) | ORAL | 0 refills | Status: DC | PRN

## 2021-10-13 MED ORDER — FLUTICASONE PROPIONATE 50 MCG/ACT NA SUSP: 2.0000 | Freq: Every day | NASAL | 0 refills | Status: DC

## 2021-10-13 NOTE — Patient Instructions (Signed)
Linda [name], thank you for joining Mar Daring, PA-C for today's virtual visit.  While this provider is not your primary care provider (PCP), if your PCP is located in our provider database this encounter information will be shared with them immediately following your visit.  Consent: (Patient) Linda [name] provided verbal consent for this virtual visit at the beginning of the encounter.  Current Medications:  Current Outpatient Medications:    fluticasone (FLONASE) 50 MCG/ACT nasal spray, Place 2 sprays into both nostrils daily., Disp: 16 g, Rfl: 0   molnupiravir EUA (LAGEVRIO) 200 mg CAPS capsule, Take 4 capsules (800 mg total) by mouth 2 (two) times daily for 5 days., Disp: 40 capsule, Rfl: 0   ondansetron (ZOFRAN-ODT) 4 MG disintegrating tablet, Take 1 tablet (4 mg total) by mouth every 8 (eight) hours as needed for nausea or vomiting., Disp: 20 tablet, Rfl: 0   amitriptyline (ELAVIL) 10 MG tablet, TAKE 2 TABLETS BY MOUTH AT NIGHT, Disp: 180 tablet, Rfl: 3   glucose blood test strip, Onetouch Delica test strips; UAD to monitor glucose bid;Dx:E11.9, Disp: 100 each, Rfl: 12   OneTouch Delica Lancets 16X MISC, UAD to monitor glucose bid;Dx E11.9, Disp: 100 each, Rfl: 12   OZEMPIC, 1 MG/DOSE, 4 MG/3ML SOPN, INJECT 1 MG AS DIRECTED ONCE A WEEK., Disp: 3 mL, Rfl: 3   rosuvastatin (CRESTOR) 20 MG tablet, Take 1 tablet (20 mg total) by mouth daily., Disp: 90 tablet, Rfl: 2  Current Facility-Administered Medications:    0.9 %  sodium chloride infusion, 500 mL, Intravenous, Once, [place], MD   Medications ordered in this encounter:  Meds ordered this encounter  Medications   ondansetron (ZOFRAN-ODT) 4 MG disintegrating tablet    Sig: Take 1 tablet (4 mg total) by mouth every 8 (eight) hours as needed for nausea or vomiting.    Dispense:  20 tablet    Refill:  0    Order Specific Question:   Supervising Provider    Answer:   MILLER, BRIAN [[id number]]   fluticasone  (FLONASE) 50 MCG/ACT nasal spray    Sig: Place 2 sprays into both nostrils daily.    Dispense:  16 g    Refill:  0    Order Specific Question:   Supervising Provider    Answer:   MILLER, BRIAN [[id number]]   molnupiravir EUA (LAGEVRIO) 200 mg CAPS capsule    Sig: Take 4 capsules (800 mg total) by mouth 2 (two) times daily for 5 days.    Dispense:  40 capsule    Refill:  0    Order Specific Question:   Supervising Provider    Answer:   Sabra Heck, [place]     *If you need refills on other medications prior to your next appointment, please contact your pharmacy*  Follow-Up: Call back or seek an in-person evaluation if the symptoms worsen or if the condition fails to improve as anticipated.  Other Instructions Molnupiravir Oral Capsules What is this medication? MOLNUPIRAVIR (mol nue pir a vir) treats COVID-19. It is an antiviral medication. It may decrease the risk of developing severe symptoms of COVID-19. It may also decrease the chance of going to the hospital. This medication is not approved by the FDA. The FDA has authorized emergency use of this medication during the COVID-19 pandemic. This medicine may be used for other purposes; ask your health care provider or pharmacist if you have questions. COMMON BRAND NAME(S): LAGEVRIO What should I tell my care team before  I take this medication? They need to know if you have any of these conditions: Any allergies Any serious illness An unusual or allergic reaction to molnupiravir, other medications, foods, dyes, or preservatives Pregnant or trying to get pregnant Breast-feeding How should I use this medication? Take this medication by mouth with water. Take it as directed on the prescription label at the same time every day. Do not cut, crush or chew this medication. Swallow the capsules whole. You can take it with or without food. If it upsets your stomach, take it with food. Take all of this medication unless your care team tells you to  stop it early. Keep taking it even if you think you are better. Talk to your care team about the use of this medication in children. Special care may be needed. Overdosage: If you think you have taken too much of this medicine contact a poison control center or emergency room at once. NOTE: This medicine is only for you. Do not share this medicine with others. What if I miss a dose? If you miss a dose, take it as soon as you can unless it is more than 10 hours late. If it is more than 10 hours late, skip the missed dose. Take the next dose at the normal time. Do not take extra or 2 doses at the same time to make up for the missed dose. What may interact with this medication? Interactions have not been studied. This list may not describe all possible interactions. Give your health care provider a list of all the medicines, herbs, non-prescription drugs, or dietary supplements you use. Also tell them if you smoke, drink alcohol, or use illegal drugs. Some items may interact with your medicine. What should I watch for while using this medication? Your condition will be monitored carefully while you are receiving this medication. Visit your care team for regular checkups. Tell your care team if your symptoms do not start to get better or if they get worse. Do not become pregnant while taking this medication. You may need a pregnancy test before starting this medication. Women must use a reliable form of birth control while taking this medication and for 4 days after stopping the medication. Women should inform their care team if they wish to become pregnant or think they might be pregnant. Men should not father a child while taking this medication and for 3 months after stopping it. There is potential for serious harm to an unborn child. Talk to your care team for more information. Do not breast-feed an infant while taking this medication and for 4 days after stopping the medication. What side effects may I  notice from receiving this medication? Side effects that you should report to your care team as soon as possible: Allergic reactions--skin rash, itching, hives, swelling of the face, lips, tongue, or throat Side effects that usually do not require medical attention (report these to your care team if they continue or are bothersome): Diarrhea Dizziness Nausea This list may not describe all possible side effects. Call your doctor for medical advice about side effects. You may report side effects to FDA at 1-800-FDA-1088. Where should I keep my medication? Keep out of the reach of children and pets. Store at room temperature between 20 and 25 degrees C (68 and 77 degrees F). Get rid of any unused medication after the expiration date. To get rid of medications that are no longer needed or have expired: Take the medication to a medication  take-back program. Check with your pharmacy or law enforcement to find a location. If you cannot return the medication, check the label or package insert to see if the medication should be thrown out in the garbage or flushed down the toilet. If you are not sure, ask your care team. If it is safe to put it in the trash, take the medication out of the container. Mix the medication with cat litter, dirt, coffee grounds, or other unwanted substance. Seal the mixture in a bag or container. Put it in the trash. NOTE: This sheet is a summary. It may not cover all possible information. If you have questions about this medicine, talk to your doctor, pharmacist, or health care provider.  2022 Elsevier/Gold Standard (2020-08-08 00:00:00)    If you have been instructed to have an in-person evaluation today at a local Urgent Care facility, please use the link below. It will take you to a list of all of our available [facility], including address, phone number and hours of operation. Please do not delay care.  [facility]  If you or a family member  do not have a primary care provider, use the link below to schedule a visit and establish care. When you choose a [facility] primary care physician or advanced practice provider, you gain a long-term partner in health. Find a Primary Care Provider  Learn more about [facility] in-office and virtual care options: [facility]

## 2021-10-13 NOTE — Progress Notes (Signed)
Based on the information that you have shared in the e-Visit Questionnaire, we recommend that you convert this visit to a video visit in order for the provider to better assess what is going on.  The provider will be able to give you a more accurate diagnosis and treatment plan if we can more freely discuss your symptoms and with the addition of a virtual examination.  ? ?As noted in the questionnaire you submitted, to get antivirals requires a video visit for a more in-depth discussion of options, risks and benefits. Please use instructions below to get set up for a video visit with [place].  ? ?If you convert to a video visit, we will bill your insurance (similar to an office visit) and you will not be charged for this e-Visit. You will be able to stay at home and speak with the first available [facility] advanced practice provider. The link to do a video visit is in the drop down Menu tab of your Welcome screen in [place]. ? ? ?

## 2021-10-13 NOTE — Progress Notes (Signed)
?Virtual Visit Consent  ? ?Linda [name], you are scheduled for a virtual visit with a [facility] provider today.   ?  ?Just as with appointments in the office, your consent must be obtained to participate.  Your consent will be active for this visit and any virtual visit you may have with one of our providers in the next 365 days.   ?  ?If you have a MyChart account, a copy of this consent can be sent to you electronically.  All virtual visits are billed to your insurance company just like a traditional visit in the office.   ? ?As this is a virtual visit, video technology does not allow for your provider to perform a traditional examination.  This may limit your provider's ability to fully assess your condition.  If your provider identifies any concerns that need to be evaluated in person or the need to arrange testing (such as labs, EKG, etc.), we will make arrangements to do so.   ?  ?Although advances in technology are sophisticated, we cannot ensure that it will always work on either your end or our end.  If the connection with a video visit is poor, the visit may have to be switched to a telephone visit.  With either a video or telephone visit, we are not always able to ensure that we have a secure connection.    ? ?I need to obtain your verbal consent now.   Are you willing to proceed with your visit today?  ?  ?Linda [name] has provided verbal consent on 10/13/2021 for a virtual visit (video or telephone). ?  ?Linda Daring, PA-C  ? ?Date: 10/13/2021 12:55 PM ? ? ?Virtual Visit via Video Note  ? ?Linda [name], connected with  Linda [name]  ([id number], 1961-07-07) on 10/13/21 at 12:45 PM EST by a video-enabled telemedicine application and verified that I am speaking with the correct person using two identifiers. ? ?Location: ?Patient: Virtual Visit Location Patient: Home ?Provider: Virtual Visit Location Provider: Home Office ?  ?I discussed the limitations of evaluation and  management by telemedicine and the availability of in person appointments. The patient expressed understanding and agreed to proceed.   ? ?History of Present Illness: ?Linda [name] is a 61 y.o. who identifies as a female who was assigned female at birth, and is being seen today for Covid 40. ? ?HPI: URI  ?This is a new problem. Episode onset: tested positive on 10/11/21; symptoms started on Tuesday 10/10/21. The problem has been gradually worsening. The maximum temperature recorded prior to her arrival was 100.4 - 100.9 F. The fever has been present for 1 to 2 days. Associated symptoms include congestion, coughing, diarrhea, headaches, nausea, rhinorrhea, sinus pain, sneezing, a sore throat and vomiting. Pertinent negatives include no chest pain, ear pain or plugged ear sensation. Associated symptoms comments: Post nasal drainage, fatigue. Treatments tried: pepto bismol, alka seltzer cold and flu, tylenol, vitamins. The treatment provided no relief.   ? ? ?Problems:  ?Patient Active Problem List  ? Diagnosis Date Noted  ? Hiatal hernia 08/24/2020  ? Anemia 07/26/2020  ? Hypertension associated with diabetes ([place]) 12/07/2019  ? Acute pain of right shoulder 12/07/2019  ? Spasm of cervical paraspinous muscle 08/06/2019  ? Colon polyp 03/20/2019  ? Headache disorder 07/30/2018  ? HLD (hyperlipidemia) 05/07/2018  ? Vertiginous migraine 01/25/2017  ? Vestibular migraine 06/12/2016  ? BPV (benign positional vertigo) 05/08/2016  ? Dizziness 01/16/2016  ?  Esophageal reflux 04/21/2014  ? Dyspepsia 03/31/2014  ? Class 1 obesity with body mass index (BMI) of 33.0 to 33.9 in adult 01/13/2013  ? Cardiac murmur 01/04/2012  ? Type 2 diabetes mellitus with other specified complication ([place]) 33/29/5188  ? GERD with esophagitis 10/09/2011  ? Allergic rhinitis, cause unspecified 12/19/2006  ?  ?Allergies:  ?Allergies  ?Allergen Reactions  ? Morphine Itching  ?  Hydrocodone is OK  ? Lisinopril Swelling  ?  Numbness and swelling of lips    ? Topamax [Topiramate] Rash  ? ?Medications:  ?Current Outpatient Medications:  ?  fluticasone (FLONASE) 50 MCG/ACT nasal spray, Place 2 sprays into both nostrils daily., Disp: 16 g, Rfl: 0 ?  molnupiravir EUA (LAGEVRIO) 200 mg CAPS capsule, Take 4 capsules (800 mg total) by mouth 2 (two) times daily for 5 days., Disp: 40 capsule, Rfl: 0 ?  ondansetron (ZOFRAN-ODT) 4 MG disintegrating tablet, Take 1 tablet (4 mg total) by mouth every 8 (eight) hours as needed for nausea or vomiting., Disp: 20 tablet, Rfl: 0 ?  amitriptyline (ELAVIL) 10 MG tablet, TAKE 2 TABLETS BY MOUTH AT NIGHT, Disp: 180 tablet, Rfl: 3 ?  glucose blood test strip, Onetouch Delica test strips; UAD to monitor glucose bid;Dx:E11.9, Disp: 100 each, Rfl: 12 ?  OneTouch Delica Lancets 41Y MISC, UAD to monitor glucose bid;Dx E11.9, Disp: 100 each, Rfl: 12 ?  OZEMPIC, 1 MG/DOSE, 4 MG/3ML SOPN, INJECT 1 MG AS DIRECTED ONCE A WEEK., Disp: 3 mL, Rfl: 3 ?  rosuvastatin (CRESTOR) 20 MG tablet, Take 1 tablet (20 mg total) by mouth daily., Disp: 90 tablet, Rfl: 2 ? ?Current Facility-Administered Medications:  ?  0.9 %  sodium chloride infusion, 500 mL, Intravenous, Once, [place] Park, MD ? ?Observations/Objective: ?Patient is well-developed, well-nourished in no acute distress.  ?Resting comfortably at home.  ?Head is normocephalic, atraumatic.  ?No labored breathing.  ?Speech is clear and coherent with logical content.  ?Patient is alert and oriented at baseline.  ? ? ?Assessment and Plan: ?1. COVID-19 ?- ondansetron (ZOFRAN-ODT) 4 MG disintegrating tablet; Take 1 tablet (4 mg total) by mouth every 8 (eight) hours as needed for nausea or vomiting.  Dispense: 20 tablet; Refill: 0 ?- fluticasone (FLONASE) 50 MCG/ACT nasal spray; Place 2 sprays into both nostrils daily.  Dispense: 16 g; Refill: 0 ?- molnupiravir EUA (LAGEVRIO) 200 mg CAPS capsule; Take 4 capsules (800 mg total) by mouth 2 (two) times daily for 5 days.  Dispense: 40 capsule; Refill: 0 ? ?-  Continue OTC symptomatic management of choice ?- Will send OTC vitamins and supplement information through AVS ?- Molnupiravir, Zofran and Fluticasone prescribed ?- Patient enrolled in MyChart symptom monitoring ?- Push fluids ?- Rest as needed ?- Discussed return precautions and when to seek in-person evaluation, sent via AVS as well ? ?Follow Up Instructions: ?I discussed the assessment and treatment plan with the patient. The patient was provided an opportunity to ask questions and all were answered. The patient agreed with the plan and demonstrated an understanding of the instructions.  A copy of instructions were sent to the patient via MyChart unless otherwise noted below.  ? ? ?The patient was advised to call back or seek an in-person evaluation if the symptoms worsen or if the condition fails to improve as anticipated. ? ?Time:  ?I spent 14 minutes with the patient via telehealth technology discussing the above problems/concerns.   ? ?Linda Daring, PA-C ?

## 2021-10-16 ENCOUNTER — Encounter: Payer: Self-pay | Admitting: Family Medicine

## 2021-10-16 MED ORDER — OZEMPIC (0.25 OR 0.5 MG/DOSE) 2 MG/1.5ML ~~LOC~~ SOPN: 0.5000 mg | PEN_INJECTOR | SUBCUTANEOUS | 3 refills | Status: DC

## 2021-10-26 ENCOUNTER — Encounter: Payer: Self-pay | Admitting: Family Medicine

## 2021-11-13 ENCOUNTER — Other Ambulatory Visit: Payer: Self-pay | Admitting: Obstetrics & Gynecology

## 2021-11-13 DIAGNOSIS — Z1231 Encounter for screening mammogram for malignant neoplasm of breast: Secondary | ICD-10-CM

## 2021-11-20 ENCOUNTER — Other Ambulatory Visit: Payer: Self-pay

## 2021-11-20 MED ORDER — OZEMPIC (0.25 OR 0.5 MG/DOSE) 2 MG/1.5ML ~~LOC~~ SOPN: 0.5000 mg | PEN_INJECTOR | SUBCUTANEOUS | 3 refills | Status: DC

## 2021-11-24 ENCOUNTER — Other Ambulatory Visit: Payer: Self-pay

## 2021-11-24 MED ORDER — OZEMPIC (1 MG/DOSE) 4 MG/3ML ~~LOC~~ SOPN: 0.5000 mg | PEN_INJECTOR | SUBCUTANEOUS | 2 refills | Status: DC

## 2021-11-27 ENCOUNTER — Ambulatory Visit
Admission: RE | Admit: 2021-11-27 | Discharge: 2021-11-27 | Disposition: A | Discharge status: Home / Self Care | Payer: BC Managed Care – PPO | Source: Ambulatory Visit | Attending: Obstetrics & Gynecology | Admitting: Obstetrics & Gynecology

## 2021-11-27 DIAGNOSIS — Z1231 Encounter for screening mammogram for malignant neoplasm of breast: Secondary | ICD-10-CM | POA: Diagnosis not present

## 2021-12-15 ENCOUNTER — Other Ambulatory Visit: Payer: Self-pay | Admitting: Neurology

## 2021-12-15 DIAGNOSIS — G43109 Migraine with aura, not intractable, without status migrainosus: Secondary | ICD-10-CM

## 2021-12-15 DIAGNOSIS — Z124 Encounter for screening for malignant neoplasm of cervix: Secondary | ICD-10-CM | POA: Diagnosis not present

## 2021-12-15 DIAGNOSIS — Z01419 Encounter for gynecological examination (general) (routine) without abnormal findings: Secondary | ICD-10-CM | POA: Diagnosis not present

## 2021-12-15 DIAGNOSIS — Z6833 Body mass index (BMI) 33.0-33.9, adult: Secondary | ICD-10-CM | POA: Diagnosis not present

## 2021-12-25 ENCOUNTER — Ambulatory Visit: Payer: BC Managed Care – PPO | Admitting: Family Medicine

## 2021-12-25 ENCOUNTER — Encounter: Payer: Self-pay | Admitting: Family Medicine

## 2021-12-25 VITALS — BP 132/88 | HR 74 | Temp 97.6°F | Wt 171.0 lb

## 2021-12-25 DIAGNOSIS — R200 Anesthesia of skin: Secondary | ICD-10-CM

## 2021-12-25 DIAGNOSIS — S61217A Laceration without foreign body of left little finger without damage to nail, initial encounter: Secondary | ICD-10-CM | POA: Diagnosis not present

## 2021-12-25 DIAGNOSIS — M79645 Pain in left finger(s): Secondary | ICD-10-CM

## 2021-12-25 MED ORDER — DOXYCYCLINE HYCLATE 100 MG PO TABS: 100.0000 mg | ORAL_TABLET | Freq: Two times a day (BID) | ORAL | 0 refills | Status: AC

## 2021-12-25 NOTE — Progress Notes (Signed)
Subjective:  ? ? Patient ID: Linda [name], female    DOB: 1960-11-12, 61 y.o.   MRN: [id number] ? ?Chief Complaint  ?Patient presents with  ? Laceration  ?  Happened yesterday, was breaking open lobster. Sliced left pinky finger, is painful.   ? ? ?HPI ?Patient was seen today for acute concern.  Pt cut anterior surface of L 5th digit with a knife while cleaning lobster yesterday 10/24/21 at 3 pm.   Pt states it took a while to get the bleeding to stop.  Cleaned laceration with alcohol.  Pt endorses numbness in finger today.  Denies fever, chills, nausea, vomiting, increased warmth at site. ? ?Past Medical History:  ?Diagnosis Date  ? Allergic rhinitis   ? Allergy   ? SEASONAL- pt states she has not had issues in "years"  ? Anemia   ? Cardiac murmur   ? 1/6 SEM at [facility] (old finding per patient)- no heart murmur per Dr. Deborra Medina  ? Diabetes mellitus without complication ([place])   ? GERD (gastroesophageal reflux disease)   ? Hyperlipidemia   ? Hypertension   ? Neuralgic migraines   ? "silent migraines"  ? Obesity   ? Vertigo   ? ? ?Allergies  ?Allergen Reactions  ? Morphine Itching  ?  Hydrocodone is OK  ? Lisinopril Swelling  ?  Numbness and swelling of lips   ? Topamax [Topiramate] Rash  ? ? ?ROS ?General: Denies fever, chills, night sweats, changes in weight, changes in appetite ?HEENT: Denies headaches, ear pain, changes in vision, rhinorrhea, sore throat ?CV: Denies CP, palpitations, SOB, orthopnea ?Pulm: Denies SOB, cough, wheezing ?GI: Denies abdominal pain, nausea, vomiting, diarrhea, constipation ?GU: Denies dysuria, hematuria, frequency, vaginal discharge ?Msk: Denies muscle cramps, joint pains  ?Neuro: Denies weakness, tingling +Numbness in L 5th digit ?Skin: Denies rashes, bruising +laceration of L 5th digit ?Psych: Denies depression, anxiety, hallucinations ? ? ?   ?Objective:  ?  ?Blood pressure 132/88, pulse 74, temperature 97.6 ?F (36.4 ?C), temperature source Oral, weight 171 lb (77.6 kg), last menstrual  period 08/28/2015, SpO2 96 %. ? ?Gen. Pleasant, well-nourished, in no distress, normal affect   ?HEENT: [place]/AT, face symmetric, conjunctiva clear, no scleral icterus, PERRLA, EOMI, nares patent without drainage ?Lungs: no accessory muscle use ?Cardiovascular: RRR, no m/r/g, no peripheral edema.  L 5th digit neruovasclarly intact. ?Musculoskeletal: Edema of L 5th digit.  Able to flex and extended L 5th digit at DIP, PIP, and MCP joints. No deformities, no cyanosis or clubbing, normal tone ?Neuro:  A&Ox3, CN II-XII intact, normal gait ?Skin:  Warm, dry.  Anterior surface of distal L 5th digit with a transverse laceration crossing DIP jt, with edema, no erythema, induration, increased warmth or ecchymosis.  Hemostatic. Wound through dermis, no sub q fat or tendons visible through opening in skin.  Area cleansed with betadine and washed out with sterile saline.  Steri strip placed and wound dressed with sterile guaze and finger splint. ? ?Wt Readings from Last 3 Encounters:  ?10/06/21 168 lb 6.4 oz (76.4 kg)  ?09/22/21 166 lb 12.8 oz (75.7 kg)  ?05/22/21 172 lb (78 kg)  ? ? ?Lab Results  ?Component Value Date  ? WBC 6.9 07/20/2020  ? HGB 11.9 (L) 07/20/2020  ? HCT 36.6 07/20/2020  ? PLT 299.0 07/20/2020  ? GLUCOSE 87 09/22/2021  ? CHOL 141 09/22/2021  ? TRIG 40 09/22/2021  ? HDL 65 09/22/2021  ? LDLDIRECT 118.9 07/05/2014  ? [place]y 65 09/22/2021  ?  ALT 23 09/22/2021  ? AST 20 09/22/2021  ? NA 140 09/22/2021  ? K 4.4 09/22/2021  ? CL 105 09/22/2021  ? CREATININE 0.79 09/22/2021  ? BUN 17 09/22/2021  ? CO2 26 09/22/2021  ? TSH 1.04 03/24/2019  ? INR 0.9 12/08/2007  ? HGBA1C 6.0 (A) 09/22/2021  ? MICROALBUR 0.8 10/25/2020  ? ? ?Assessment/Plan: ? ?Laceration of left little finger without foreign body without damage to nail, initial encounter  ?-consent obtained.  L 5th digit thoroughly cleaned, steri strip placed, and dressed with sterile dressing.  Patient tolerated procedure well. ?-Start ppx abx against Vibrio vunificus  with Doxycycline as pt was cleaning shellfish with the knife that lacerated her finger. ?-discussed s/s of infection ?-limit sun exposure while on Doxycycline. ?-Pt with h/o DM II, Hgb A1C 6.0% on 09/22/21 which may affect healing. ?-given handout ?-Does not appear tendon laceration occurred, however offered referral to hand surgery.  Pt declines. ?-given strict precautions ?- Plan: doxycycline (VIBRA-TABS) 100 MG tablet ? ?Numbness of finger ?-2/2 edema vs laceration of superficial nerve. ?-elevated hand when able to help reduce edema ?-monitor for s/s of infection. ?-for continued or worsened symptoms notify clinic. ? ?Finger pain ?-currently without pain ?-Tylenol or ibuprofen prn ? ?F/u in 10-14 days to assess healing, sooner if needed. ? ?Grier Mitts, MD ?

## 2022-01-30 ENCOUNTER — Encounter: Payer: Self-pay | Admitting: Orthopedic Surgery

## 2022-01-30 ENCOUNTER — Ambulatory Visit (INDEPENDENT_AMBULATORY_CARE_PROVIDER_SITE_OTHER): Payer: BC Managed Care – PPO

## 2022-01-30 ENCOUNTER — Ambulatory Visit: Payer: BC Managed Care – PPO | Admitting: Orthopedic Surgery

## 2022-01-30 VITALS — BP 146/86 | HR 64 | Ht 60.0 in | Wt 167.0 lb

## 2022-01-30 DIAGNOSIS — S61217A Laceration without foreign body of left little finger without damage to nail, initial encounter: Secondary | ICD-10-CM | POA: Diagnosis not present

## 2022-01-30 NOTE — Progress Notes (Signed)
Office Visit Note   Patient: Linda [name]           Date of Birth: 03-Jan-1961           MRN: [id number] Visit Date: 01/30/2022              Requested by: Martinique, Betty G, MD [address] [place],  [place] [address] PCP: Martinique, Betty G, MD   Assessment & Plan: Visit Diagnoses:  1. Laceration of left little finger w/o foreign body w/o damage to nail, initial encounter     Plan: Patient is approximately 5 weeks out from a laceration to the volar aspect of the left small finger distal to the PIP joint.  She does have diffuse swelling of the finger but without any erythema.  She is painful just at the PIP joint with no tenderness palpation along the flexor tendon sheath.  She does have some stiffness in this finger with a mild flexion contracture at the PIP joint and limited flexion of this finger.  We discussed trying some therapy to work on edema control and range of motion.  Per report, patient kept the wound very clean and was discharged from her primary care office on an oral antibiotic so I am less concerned about infection.  We did discuss signs to look out for for infection including increased swelling, erythema, warmth, and drainage.  I can see her back after some time in therapy to see how she is doing.  Follow-Up Instructions: No follow-ups on file.   Orders:  Orders Placed This Encounter  Procedures   XR Finger Little Left   No orders of the defined types were placed in this encounter.     Procedures: No procedures performed   Clinical Data: No additional findings.   Subjective: Chief Complaint  Patient presents with   Left Little Finger - Injury    LEFT Handed, Pain: 3-4/10, DOI: 12/24/21, cut it cracking open lobster tails, saw PCP for it on 12/25/21, trouble with bending it or straightening it out,     This is a 61 year old left-hand-dominant female who presents for follow-up of a left small finger laceration.  On 12/24/2021 patient was cracking some  lobster tails when she cut the volar aspect of the small finger just distal to the PIP joint.  Per chart review, the laceration was superficial.  It was not closed.  She was seen by her PCP who encouraged patient to keep the incision clean and dry while it healed.  Patient was discharged on oral antibiotic.  She notes that she has developed worsening swelling of the last week with increased stiffness.  She not sure if she has reinjured the finger.  She denies any numbness or paresthesias in the finger.    Injury    Review of Systems   Objective: Vital Signs: BP (!) 146/86 (BP Location: Left Arm, Patient Position: Sitting)   Pulse 64   Ht 5' (1.524 m)   Wt 167 lb (75.8 kg)   LMP 08/28/2015 (Approximate)   BMI 32.61 kg/m   Physical Exam Constitutional:      Appearance: Normal appearance.  Cardiovascular:     Rate and Rhythm: Normal rate.     Pulses: Normal pulses.  Pulmonary:     Effort: Pulmonary effort is normal.  Skin:    General: Skin is warm and dry.     Capillary Refill: Capillary refill takes less than 2 seconds.  Neurological:     Mental Status: She  is alert.     Left Hand Exam   Tenderness  Left hand tenderness location: TTP at PIP joint of small finger worse on volar side.  No TTP along flexor tendon sheath.   Other  Erythema: absent Sensation: normal Pulse: present  Comments:  Mild fixed flexion contracture at the PIP joint.  Limited ROM of the small finger and lacks ~1cm from [facility].       Specialty Comments:  No specialty comments available.  Imaging: No results found.   PMFS History: Patient Active Problem List   Diagnosis Date Noted   Hiatal hernia 08/24/2020   Anemia 07/26/2020   Hypertension associated with diabetes ([place]) 12/07/2019   Acute pain of right shoulder 12/07/2019   Spasm of cervical paraspinous muscle 08/06/2019   Colon polyp 03/20/2019   Headache disorder 07/30/2018   HLD (hyperlipidemia) 05/07/2018   Vertiginous migraine  01/25/2017   Vestibular migraine 06/12/2016   BPV (benign positional vertigo) 05/08/2016   Dizziness 01/16/2016   Esophageal reflux 04/21/2014   Dyspepsia 03/31/2014   Class 1 obesity with body mass index (BMI) of 33.0 to 33.9 in adult 01/13/2013   Cardiac murmur 01/04/2012   Type 2 diabetes mellitus with other specified complication ([place]) 41/63/8453   GERD with esophagitis 10/09/2011   Allergic rhinitis, cause unspecified 12/19/2006   Past Medical History:  Diagnosis Date   Allergic rhinitis    Allergy    SEASONAL- pt states she has not had issues in "years"   Anemia    Cardiac murmur    1/6 SEM at [facility] (old finding per patient)- no heart murmur per Dr. Deborra Medina   Diabetes mellitus without complication (HCC)    GERD (gastroesophageal reflux disease)    Hyperlipidemia    Hypertension    Neuralgic migraines    "silent migraines"   Obesity    Vertigo     Family History  Problem Relation Age of Onset   Hypertension Mother    Early death Father        car accident   Diabetes Maternal Aunt    Renal Disease Maternal Aunt    Heart disease Maternal Grandmother    Heart disease Paternal Grandmother    Colon cancer Neg Hx    Stomach cancer Neg Hx    Pancreatic cancer Neg Hx    Esophageal cancer Neg Hx    Liver disease Neg Hx    Rectal cancer Neg Hx     Past Surgical History:  Procedure Laterality Date   COLONOSCOPY     CYST EXCISION N/A 02/09/2020   Procedure: EXCISION UPPER BACK CYST;  Surgeon: Coralie Keens, MD;  Location: [facility];  Service: General;  Laterality: N/A;   fibroid ablation     skin graft for burn     trigger thumb     UPPER GASTROINTESTINAL ENDOSCOPY     Social History   Occupational History   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

## 2022-02-06 DIAGNOSIS — Z13820 Encounter for screening for osteoporosis: Secondary | ICD-10-CM | POA: Diagnosis not present

## 2022-02-23 ENCOUNTER — Ambulatory Visit: Payer: BC Managed Care – PPO | Admitting: Orthopedic Surgery

## 2022-02-23 DIAGNOSIS — M79645 Pain in left finger(s): Secondary | ICD-10-CM | POA: Diagnosis not present

## 2022-02-24 ENCOUNTER — Encounter: Payer: Self-pay | Admitting: Orthopedic Surgery

## 2022-02-24 NOTE — Progress Notes (Signed)
Office Visit Note   Patient: Linda [name]           Date of Birth: 1960-08-24           MRN: [id number] Visit Date: 02/23/2022 Requested by: Martinique, Betty G, MD [place],   [id number] PCP: Martinique, Betty G, MD  Subjective: Chief Complaint  Patient presents with   Left Hand - Pain    HPI: Linda [name] is a 61 year old patient is left hand dominant with left small finger swelling.  She had a laceration on Mother's Day at the DIP joint.  She states that since that time she has developed swelling of the PIP joint and deformity.  She has seen Dr. Tempie Donning in our practice.  She would like to get a second opinion about her finger.  She states that her PIP joint hurts and aches.  Radiographs demonstrate no fracture but there is a flexion deformity of the PIP joint on plain radiographs.              ROS: All systems reviewed are negative as they relate to the chief complaint within the history of present illness.  Patient denies  fevers or chills.   Assessment & Plan: Visit Diagnoses:  1. Pain in left finger(s)     Plan: Impression is PIP deformity which by history should not really be present but clinically looks like an early boutonniere deformity.  Currently 2 months old.  Swelling is present at the PIP joint.  Ultrasound examination demonstrates competency of the FDP tendon.  Collaterals are stable.  Plan at this time is to refer to hand specialist Dr. Harl Favor with the question being is this deformity too late to treat with splinting at 2 months postinjury.  We will refer her to him for that evaluation..  Follow-Up Instructions: Return if symptoms worsen or fail to improve.   Orders:  Orders Placed This Encounter  Procedures   Ambulatory referral to Orthopedic Surgery   No orders of the defined types were placed in this encounter.     Procedures: No procedures performed   Clinical Data: No additional findings.  Objective: Vital Signs: LMP  08/28/2015 (Approximate)   Physical Exam:   Constitutional: Patient appears well-developed HEENT:  Head: Normocephalic Eyes:EOM are normal Neck: Normal range of motion Cardiovascular: Normal rate Pulmonary/chest: Effort normal Neurologic: Patient is alert Skin: Skin is warm Psychiatric: Patient has normal mood and affect   Ortho Exam: Ortho exam of the small finger demonstrates flexion deformity at the PIP joint of approximately 15 degrees.  Right small finger comes fully straight left small finger has a slightly flexible deformity of the PIP joint.  Sensation and perfusion intact distal to the PIP joint.  Collaterals are stable at the DIP and PIP joint.  DIP flexion is about half on the left compared to the right.  PIP flexion is about 80 degrees compared to 100 on the right.  Specialty Comments:  No specialty comments available.  Imaging: No results found.   PMFS History: Patient Active Problem List   Diagnosis Date Noted   Hiatal hernia 08/24/2020   Anemia 07/26/2020   Hypertension associated with diabetes ([place]) 12/07/2019   Acute pain of right shoulder 12/07/2019   Spasm of cervical paraspinous muscle 08/06/2019   Colon polyp 03/20/2019   Headache disorder 07/30/2018   HLD (hyperlipidemia) 05/07/2018   Vertiginous migraine 01/25/2017   Vestibular migraine 06/12/2016   BPV (benign positional vertigo) 05/08/2016   Dizziness 01/16/2016  Esophageal reflux 04/21/2014   Dyspepsia 03/31/2014   Class 1 obesity with body mass index (BMI) of 33.0 to 33.9 in adult 01/13/2013   Cardiac murmur 01/04/2012   Type 2 diabetes mellitus with other specified complication ([place]) 35/32/9924   GERD with esophagitis 10/09/2011   Allergic rhinitis, cause unspecified 12/19/2006   Past Medical History:  Diagnosis Date   Allergic rhinitis    Allergy    SEASONAL- pt states she has not had issues in "years"   Anemia    Cardiac murmur    1/6 SEM at [facility] (old finding per patient)- no heart  murmur per Dr. Deborra Medina   Diabetes mellitus without complication ([place])    GERD (gastroesophageal reflux disease)    Hyperlipidemia    Hypertension    Neuralgic migraines    "silent migraines"   Obesity    Vertigo     Family History  Problem Relation Age of Onset   Hypertension Mother    Early death Father        car accident   Diabetes Maternal Aunt    Renal Disease Maternal Aunt    Heart disease Maternal Grandmother    Heart disease Paternal Grandmother    Colon cancer Neg Hx    Stomach cancer Neg Hx    Pancreatic cancer Neg Hx    Esophageal cancer Neg Hx    Liver disease Neg Hx    Rectal cancer Neg Hx     Past Surgical History:  Procedure Laterality Date   COLONOSCOPY     CYST EXCISION N/A 02/09/2020   Procedure: EXCISION UPPER BACK CYST;  Surgeon: Coralie Keens, MD;  Location: [facility];  Service: General;  Laterality: N/A;   fibroid ablation     skin graft for burn     trigger thumb     UPPER GASTROINTESTINAL ENDOSCOPY     Social History   Occupational History   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

## 2022-03-01 ENCOUNTER — Telehealth: Payer: Self-pay | Admitting: Orthopedic Surgery

## 2022-03-01 NOTE — Telephone Encounter (Signed)
Patient aware CD is ready for pickup at front desk.

## 2022-03-01 NOTE — Telephone Encounter (Signed)
Received call from pt. Dr. Marlou Sa referred her to Dr. Burney Gauze. She needs 01/30/22 xrays on CD. Patient would like to pick up tomorrow. Please call when ready [phone] (records are ready at the front desk)

## 2022-03-02 ENCOUNTER — Other Ambulatory Visit: Payer: Self-pay

## 2022-03-02 MED ORDER — OZEMPIC (0.25 OR 0.5 MG/DOSE) 2 MG/3ML ~~LOC~~ SOPN: 0.5000 mg | PEN_INJECTOR | SUBCUTANEOUS | 3 refills | Status: DC

## 2022-03-08 DIAGNOSIS — S6992XA Unspecified injury of left wrist, hand and finger(s), initial encounter: Secondary | ICD-10-CM | POA: Diagnosis not present

## 2022-03-08 DIAGNOSIS — M25642 Stiffness of left hand, not elsewhere classified: Secondary | ICD-10-CM | POA: Diagnosis not present

## 2022-03-08 DIAGNOSIS — M79645 Pain in left finger(s): Secondary | ICD-10-CM | POA: Diagnosis not present

## 2022-03-08 DIAGNOSIS — S61217S Laceration without foreign body of left little finger without damage to nail, sequela: Secondary | ICD-10-CM | POA: Diagnosis not present

## 2022-03-08 DIAGNOSIS — R29898 Other symptoms and signs involving the musculoskeletal system: Secondary | ICD-10-CM | POA: Diagnosis not present

## 2022-03-19 DIAGNOSIS — R29898 Other symptoms and signs involving the musculoskeletal system: Secondary | ICD-10-CM | POA: Diagnosis not present

## 2022-03-19 DIAGNOSIS — S61217S Laceration without foreign body of left little finger without damage to nail, sequela: Secondary | ICD-10-CM | POA: Diagnosis not present

## 2022-03-19 DIAGNOSIS — M25642 Stiffness of left hand, not elsewhere classified: Secondary | ICD-10-CM | POA: Diagnosis not present

## 2022-03-19 DIAGNOSIS — M79645 Pain in left finger(s): Secondary | ICD-10-CM | POA: Diagnosis not present

## 2022-03-26 DIAGNOSIS — R29898 Other symptoms and signs involving the musculoskeletal system: Secondary | ICD-10-CM | POA: Diagnosis not present

## 2022-03-26 DIAGNOSIS — M25642 Stiffness of left hand, not elsewhere classified: Secondary | ICD-10-CM | POA: Diagnosis not present

## 2022-03-26 DIAGNOSIS — M79645 Pain in left finger(s): Secondary | ICD-10-CM | POA: Diagnosis not present

## 2022-03-26 DIAGNOSIS — S61217S Laceration without foreign body of left little finger without damage to nail, sequela: Secondary | ICD-10-CM | POA: Diagnosis not present

## 2022-04-05 DIAGNOSIS — M25542 Pain in joints of left hand: Secondary | ICD-10-CM | POA: Diagnosis not present

## 2022-04-05 DIAGNOSIS — S61217S Laceration without foreign body of left little finger without damage to nail, sequela: Secondary | ICD-10-CM | POA: Diagnosis not present

## 2022-04-20 DIAGNOSIS — S53442A Ulnar collateral ligament sprain of left elbow, initial encounter: Secondary | ICD-10-CM | POA: Diagnosis not present

## 2022-04-26 DIAGNOSIS — S61217S Laceration without foreign body of left little finger without damage to nail, sequela: Secondary | ICD-10-CM | POA: Diagnosis not present

## 2022-04-26 DIAGNOSIS — G5603 Carpal tunnel syndrome, bilateral upper limbs: Secondary | ICD-10-CM | POA: Diagnosis not present

## 2022-05-09 DIAGNOSIS — G5613 Other lesions of median nerve, bilateral upper limbs: Secondary | ICD-10-CM | POA: Diagnosis not present

## 2022-05-15 DIAGNOSIS — G5622 Lesion of ulnar nerve, left upper limb: Secondary | ICD-10-CM | POA: Diagnosis not present

## 2022-05-15 DIAGNOSIS — G5603 Carpal tunnel syndrome, bilateral upper limbs: Secondary | ICD-10-CM | POA: Diagnosis not present

## 2022-05-15 DIAGNOSIS — M67442 Ganglion, left hand: Secondary | ICD-10-CM | POA: Diagnosis not present

## 2022-06-13 ENCOUNTER — Other Ambulatory Visit: Payer: Self-pay

## 2022-06-13 DIAGNOSIS — G5622 Lesion of ulnar nerve, left upper limb: Secondary | ICD-10-CM | POA: Diagnosis not present

## 2022-06-13 DIAGNOSIS — M659 Synovitis and tenosynovitis, unspecified: Secondary | ICD-10-CM | POA: Diagnosis not present

## 2022-06-13 DIAGNOSIS — M71342 Other bursal cyst, left hand: Secondary | ICD-10-CM | POA: Diagnosis not present

## 2022-06-13 DIAGNOSIS — G5602 Carpal tunnel syndrome, left upper limb: Secondary | ICD-10-CM | POA: Diagnosis not present

## 2022-06-13 DIAGNOSIS — M65842 Other synovitis and tenosynovitis, left hand: Secondary | ICD-10-CM | POA: Diagnosis not present

## 2022-07-13 DIAGNOSIS — G5622 Lesion of ulnar nerve, left upper limb: Secondary | ICD-10-CM | POA: Diagnosis not present

## 2022-07-13 DIAGNOSIS — M25642 Stiffness of left hand, not elsewhere classified: Secondary | ICD-10-CM | POA: Diagnosis not present

## 2022-07-13 DIAGNOSIS — S61217S Laceration without foreign body of left little finger without damage to nail, sequela: Secondary | ICD-10-CM | POA: Diagnosis not present

## 2022-07-13 DIAGNOSIS — G5603 Carpal tunnel syndrome, bilateral upper limbs: Secondary | ICD-10-CM | POA: Diagnosis not present

## 2022-07-17 DIAGNOSIS — S61217S Laceration without foreign body of left little finger without damage to nail, sequela: Secondary | ICD-10-CM | POA: Diagnosis not present

## 2022-07-17 DIAGNOSIS — M25642 Stiffness of left hand, not elsewhere classified: Secondary | ICD-10-CM | POA: Diagnosis not present

## 2022-07-17 DIAGNOSIS — G5603 Carpal tunnel syndrome, bilateral upper limbs: Secondary | ICD-10-CM | POA: Diagnosis not present

## 2022-07-17 DIAGNOSIS — G5622 Lesion of ulnar nerve, left upper limb: Secondary | ICD-10-CM | POA: Diagnosis not present

## 2022-07-24 DIAGNOSIS — G5603 Carpal tunnel syndrome, bilateral upper limbs: Secondary | ICD-10-CM | POA: Diagnosis not present

## 2022-07-24 DIAGNOSIS — M25642 Stiffness of left hand, not elsewhere classified: Secondary | ICD-10-CM | POA: Diagnosis not present

## 2022-07-24 DIAGNOSIS — S61217S Laceration without foreign body of left little finger without damage to nail, sequela: Secondary | ICD-10-CM | POA: Diagnosis not present

## 2022-07-24 DIAGNOSIS — G5622 Lesion of ulnar nerve, left upper limb: Secondary | ICD-10-CM | POA: Diagnosis not present

## 2022-07-31 DIAGNOSIS — G5603 Carpal tunnel syndrome, bilateral upper limbs: Secondary | ICD-10-CM | POA: Diagnosis not present

## 2022-07-31 DIAGNOSIS — M25642 Stiffness of left hand, not elsewhere classified: Secondary | ICD-10-CM | POA: Diagnosis not present

## 2022-07-31 DIAGNOSIS — G5622 Lesion of ulnar nerve, left upper limb: Secondary | ICD-10-CM | POA: Diagnosis not present

## 2022-07-31 DIAGNOSIS — S61217S Laceration without foreign body of left little finger without damage to nail, sequela: Secondary | ICD-10-CM | POA: Diagnosis not present

## 2022-09-11 DIAGNOSIS — G5603 Carpal tunnel syndrome, bilateral upper limbs: Secondary | ICD-10-CM | POA: Diagnosis not present

## 2022-09-11 DIAGNOSIS — S61217S Laceration without foreign body of left little finger without damage to nail, sequela: Secondary | ICD-10-CM | POA: Diagnosis not present

## 2022-09-11 DIAGNOSIS — G5622 Lesion of ulnar nerve, left upper limb: Secondary | ICD-10-CM | POA: Diagnosis not present

## 2022-09-21 DIAGNOSIS — G5603 Carpal tunnel syndrome, bilateral upper limbs: Secondary | ICD-10-CM | POA: Diagnosis not present

## 2022-10-02 NOTE — Progress Notes (Addendum)
HPI: Linda [name] is a 62 y.o. female with past medical history significant for GERD, DM 2, allergy rhinitis, hyperlipidemia, and hypertension here today for her routine physical and follow-up.  Last CPE: 11/28/20 Last appointment with her gynecologist about a year ago.  She reports not exercising regularly but being active with chores around the house.  She cooks at home and eats out twice a week.   She was previously on a strict low-carbohydrate diet when she was on the [employer] weight loss program, but her weight has been gradually climbing since the program was canceled.  She reports sleeping an average of seven hours per night, not smoking, and not drinking alcohol frequently. When she does drink, she prefers Michelob Ultra due to its low carbohydrate content.   Immunization History  Administered Date(s) Administered   Influenza,inj,Quad PF,6+ Mos 05/07/2018, 04/21/2019, 05/21/2020, 05/22/2021   PFIZER(Purple Top)SARS-COV-2 Vaccination 10/17/2019, 11/14/2019, 05/21/2020   Pneumococcal Polysaccharide-23 04/14/2015   Tdap 02/03/2018   Unspecified SARS-COV-2 Vaccination 06/11/2021   Zoster Recombinat (Shingrix) 05/22/2021, 09/22/2021   Health Maintenance  Topic Date Due   OPHTHALMOLOGY EXAM  02/03/2021   Diabetic kidney evaluation - Urine ACR  10/25/2021   Diabetic kidney evaluation - eGFR measurement  09/22/2022   COVID-19 Vaccine (5 - 2023-24 season) 10/19/2022 (Originally 04/13/2022)   INFLUENZA VACCINE  11/11/2022 (Originally 03/13/2022)   HEMOGLOBIN A1C  04/03/2023   FOOT EXAM  10/04/2023   MAMMOGRAM  11/28/2023   PAP SMEAR-Modifier  08/12/2024   COLONOSCOPY (Pts 45-69yr Insurance coverage will need to be confirmed)  08/27/2026   DTaP/Tdap/Td (2 - Td or Tdap) 02/04/2028   Hepatitis C Screening  Completed   HIV Screening  Completed   Zoster Vaccines- Shingrix  Completed   HPV VACCINES  Aged Out   Diabetes Mellitus II: Diagnosed in 09/2011. She checks  BS occasionally, 120s. Currently she is on Ozempic 0.5 mg weekly, which she has tolerated well with no side effects. - Negative for symptoms of hypoglycemia, polyuria, polydipsia, numbness extremities, foot ulcers/trauma  Lab Results  Component Value Date   HGBA1C 6.1 10/03/2022   Lab Results  Component Value Date   MICROALBUR 0.8 10/25/2020   Hyperlipidemia: Currently she is on rosuvastatin 20 mg daily. She has tolerated medication well, no side effects reported. Lab Results  Component Value Date   CHOL 141 09/22/2021   HDL 65 09/22/2021   LDLCALC 65 09/22/2021   LDLDIRECT 118.9 07/05/2014   TRIG 40 09/22/2021   CHOLHDL 2.2 09/22/2021  History of vestibular migraines, she is on amitriptyline 10 mg about 3 times per week.  She follows with neurologist.  Hypertension on nonpharmacologic treatment. Lab Results  Component Value Date   CREATININE 0.79 09/22/2021   BUN 17 09/22/2021   NA 140 09/22/2021   K 4.4 09/22/2021   CL 105 09/22/2021   CO2 26 09/22/2021   Review of Systems  Constitutional:  Negative for activity change, appetite change and fever.  HENT:  Negative for hearing loss, mouth sores and sore throat.   Eyes:  Negative for redness and visual disturbance.  Respiratory:  Negative for cough, shortness of breath and wheezing.   Cardiovascular:  Negative for chest pain, palpitations and leg swelling.  Gastrointestinal:  Negative for abdominal pain, nausea and vomiting.       No changes in bowel habits.  Endocrine: Negative for cold intolerance, heat intolerance, polydipsia, polyphagia and polyuria.  Genitourinary:  Negative for decreased urine volume, dysuria, hematuria, vaginal  bleeding and vaginal discharge.  Musculoskeletal:  Negative for gait problem and myalgias.  Skin:  Negative for color change and rash.  Allergic/Immunologic: Positive for environmental allergies.  Neurological:  Negative for seizures, syncope, weakness and headaches.  Hematological:   Negative for adenopathy. Does not bruise/bleed easily.  Psychiatric/Behavioral:  Negative for confusion. The patient is not nervous/anxious.   All other systems reviewed and are negative.  Current Outpatient Medications on File Prior to Visit  Medication Sig Dispense Refill   amitriptyline (ELAVIL) 10 MG tablet TAKE 2 TABLETS BY MOUTH EVERY DAY AT NIGHT 180 tablet 3   glucose blood test strip Onetouch Delica test strips; UAD to monitor glucose bid;Dx:E11.9 100 each 12   OneTouch Delica Lancets 99991111 MISC UAD to monitor glucose bid;Dx E11.9 100 each 12   rosuvastatin (CRESTOR) 20 MG tablet Take 1 tablet (20 mg total) by mouth daily. 90 tablet 2   No current facility-administered medications on file prior to visit.   Past Medical History:  Diagnosis Date   Allergic rhinitis    Allergy    SEASONAL- pt states she has not had issues in "years"   Anemia    Cardiac murmur    1/6 SEM at [facility] (old finding per patient)- no heart murmur per Dr. Deborra Medina   Diabetes mellitus without complication (HCC)    GERD (gastroesophageal reflux disease)    Hyperlipidemia    Hypertension    Neuralgic migraines    "silent migraines"   Obesity    Vertigo    Past Surgical History:  Procedure Laterality Date   COLONOSCOPY     CYST EXCISION N/A 02/09/2020   Procedure: EXCISION UPPER BACK CYST;  Surgeon: Coralie Keens, MD;  Location: [facility];  Service: General;  Laterality: N/A;   fibroid ablation     skin graft for burn     trigger thumb     UPPER GASTROINTESTINAL ENDOSCOPY      Allergies  Allergen Reactions   Morphine Itching    Hydrocodone is OK   Lisinopril Swelling    Numbness and swelling of lips    Topamax [Topiramate] Rash    Family History  Problem Relation Age of Onset   Hypertension Mother    Early death Father        car accident   Diabetes Maternal Aunt    Renal Disease Maternal Aunt    Heart disease Maternal Grandmother    Heart disease Paternal Grandmother     Colon cancer Neg Hx    Stomach cancer Neg Hx    Pancreatic cancer Neg Hx    Esophageal cancer Neg Hx    Liver disease Neg Hx    Rectal cancer Neg Hx     Social History   Socioeconomic History   Marital status: Married    Spouse name: Charles   Number of children: 0   Years of education: Masters   Highest education level: Not on file  Occupational History   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  Other Topics Concern   Not on file  Social History Narrative   12/07/19   From: the area   Living: with  husband, Juanda Crumble   Work: Teacher, English as a foreign language at [employer]   Education: [employer] grad, [place] 2013      Family: Step son - Destinymarie Steichen       Enjoys: sing - band shows      Exercise: walking on the treadmill    Diet: not doing great with this, trying to cut back on carb intake      Safety   Seat belts: Yes    Guns: Yes  and secure   Safe in relationships: Yes     left handed    Social Determinants of Health   Financial Resource Strain: Not on file  Food Insecurity: Not on file  Transportation Needs: Not on file  Physical Activity: Not on file  Stress: Not on file  Social Connections: Not on file   Vitals:   10/03/22 1122  BP: 124/80  Pulse: 91  Resp: 16  Temp: 98.4 F (36.9 C)  SpO2: 96%  Body mass index is 36.77 kg/m. Wt Readings from Last 3 Encounters:  10/03/22 188 lb 4 oz (85.4 kg)  01/30/22 167 lb (75.8 kg)  12/25/21 171 lb (77.6 kg)  Physical Exam Vitals and nursing note reviewed.  Constitutional:      General: She is not in acute distress.    Appearance: She is well-developed.  HENT:     Head: Normocephalic and atraumatic.     Right Ear: Hearing, tympanic membrane, ear  canal and external ear normal.     Left Ear: Hearing, tympanic membrane, ear canal and external ear normal.     Mouth/Throat:     Mouth: Mucous membranes are moist.     Pharynx: Oropharynx is clear. Uvula midline.  Eyes:     Extraocular Movements: Extraocular movements intact.     Conjunctiva/sclera: Conjunctivae normal.     Pupils: Pupils are equal, round, and reactive to light.  Neck:     Thyroid: No thyromegaly.     Trachea: No tracheal deviation.  Cardiovascular:     Rate and Rhythm: Normal rate and regular rhythm.     Pulses:          Dorsalis pedis pulses are 2+ on the right side and 2+ on the left side.     Heart sounds: No murmur heard. Pulmonary:     Effort: Pulmonary effort is normal. No respiratory distress.     Breath sounds: Normal breath sounds.  Abdominal:     Palpations: Abdomen is soft. There is no hepatomegaly or mass.     Tenderness: There is no abdominal tenderness.  Genitourinary:    Comments: Deferred to gyn. Musculoskeletal:     Comments: No major deformity or signs of synovitis appreciated.  Lymphadenopathy:     Cervical: No cervical adenopathy.     Upper Body:     Right upper body: No supraclavicular adenopathy.     Left upper body: No supraclavicular adenopathy.  Skin:    General: Skin is warm.     Findings: No erythema or rash.  Neurological:     General: No focal deficit present.     Mental Status: She is alert and oriented to person, place, and time.     Cranial Nerves: No cranial nerve deficit.     Coordination: Coordination normal.     Gait: Gait normal.     Deep Tendon Reflexes:     Reflex Scores:      Bicep reflexes are 2+ on the right side and 2+ on the left side.  Patellar reflexes are 2+ on the right side and 2+ on the left side. Psychiatric:        Mood and Affect: Mood and affect normal.   ASSESSMENT AND PLAN: Ms. AMBERNICOLE USREY was here today annual physical examination.  Orders Placed This Encounter  Procedures    Comprehensive metabolic panel   Lipid panel   Microalbumin / creatinine urine ratio   Amb Ref to Medical Weight Management   POC HgB A1c   Routine general medical examination at a health care facility Assessment & Plan: We discussed the importance of regular physical activity and healthy diet for prevention of chronic illness and/or complications. Preventive guidelines reviewed. Vaccination up-to-date. Continue her female preventive care with gynecologist. Next CPE in a year.   Type 2 diabetes mellitus with other specified complication, without long-term current use of insulin (HCC) -     Comprehensive metabolic panel; Future -     Microalbumin / creatinine urine ratio; Future -     POCT glycosylated hemoglobin (Hb A1C) -     Semaglutide (1 MG/DOSE); Inject 1 mg as directed once a week.  Dispense: 9 mL; Refill: 1  Hyperlipidemia, unspecified hyperlipidemia type Assessment & Plan: Continue rosuvastatin 20 mg daily. She is not fasting today, so she will be back later this week for fasting labs.  Orders: -     Lipid panel; Future  Hypertension associated with diabetes ([place]) Assessment & Plan: BP adequately controlled. Continue non pharmacologic treatment. Eye exam is due, she has an appointment in 10/2022.   Class 2 severe obesity with serious comorbidity and body mass index (BMI) of 35.0 to 35.9 in adult, unspecified obesity type [facility]) Assessment & Plan: She understands the benefits of wt loss as well as adverse effects of obesity. Consistency with healthy diet and physical activity encouraged. She is on Ozempic for DM2 management, increased from 0.5 mg to 1 mg weekly. She would like to establish with weight loss clinic, referral placed.  Orders: -     Amb Ref to Medical Weight Management  Return in 6 months (on 04/03/2023) for chronic problems. Fasting labs this week. Johnni Wunschel G. Martinique, MD  [facility]. [place] office.

## 2022-10-03 ENCOUNTER — Ambulatory Visit (INDEPENDENT_AMBULATORY_CARE_PROVIDER_SITE_OTHER): Payer: BC Managed Care – PPO | Admitting: Family Medicine

## 2022-10-03 ENCOUNTER — Encounter: Payer: Self-pay | Admitting: Family Medicine

## 2022-10-03 VITALS — BP 124/80 | HR 91 | Temp 98.4°F | Resp 16 | Ht 60.0 in | Wt 188.2 lb

## 2022-10-03 DIAGNOSIS — Z6835 Body mass index (BMI) 35.0-35.9, adult: Secondary | ICD-10-CM | POA: Diagnosis not present

## 2022-10-03 DIAGNOSIS — E1159 Type 2 diabetes mellitus with other circulatory complications: Secondary | ICD-10-CM

## 2022-10-03 DIAGNOSIS — E785 Hyperlipidemia, unspecified: Secondary | ICD-10-CM

## 2022-10-03 DIAGNOSIS — Z0000 Encounter for general adult medical examination without abnormal findings: Secondary | ICD-10-CM | POA: Diagnosis not present

## 2022-10-03 DIAGNOSIS — I152 Hypertension secondary to endocrine disorders: Secondary | ICD-10-CM

## 2022-10-03 DIAGNOSIS — E1169 Type 2 diabetes mellitus with other specified complication: Secondary | ICD-10-CM | POA: Diagnosis not present

## 2022-10-03 LAB — POCT GLYCOSYLATED HEMOGLOBIN (HGB A1C): HbA1c, POC (prediabetic range): 6.1 % (ref 5.7–6.4)

## 2022-10-03 MED ORDER — SEMAGLUTIDE (1 MG/DOSE) 4 MG/3ML ~~LOC~~ SOPN: 1.0000 mg | PEN_INJECTOR | SUBCUTANEOUS | 1 refills | Status: DC

## 2022-10-03 NOTE — Assessment & Plan Note (Signed)
BP adequately controlled. Continue non pharmacologic treatment. Eye exam is due, she has an appointment in 10/2022.

## 2022-10-03 NOTE — Assessment & Plan Note (Signed)
Continue rosuvastatin 20 mg daily. She is not fasting today, so she will be back later this week for fasting labs.

## 2022-10-03 NOTE — Assessment & Plan Note (Signed)
She understands the benefits of wt loss as well as adverse effects of obesity. Consistency with healthy diet and physical activity encouraged. She is on Ozempic for DM2 management, increased from 0.5 mg to 1 mg weekly. She would like to establish with weight loss clinic, referral placed.

## 2022-10-03 NOTE — Assessment & Plan Note (Signed)
We discussed the importance of regular physical activity and healthy diet for prevention of chronic illness and/or complications. Preventive guidelines reviewed. Vaccination up-to-date. Continue her female preventive care with gynecologist. Next CPE in a year.

## 2022-10-03 NOTE — Patient Instructions (Addendum)
A few things to remember from today's visit:  Routine general medical examination at a health care facility  Type 2 diabetes mellitus with other specified complication, without long-term current use of insulin ([place]) - Plan: Comprehensive metabolic panel, Microalbumin / creatinine urine ratio, CANCELED: Hemoglobin A1c  Hyperlipidemia, unspecified hyperlipidemia type - Plan: Lipid panel  Hypertension associated with diabetes ([place])  If you need refills for medications you take chronically, please call your pharmacy. Do not use My Chart to request refills or for acute issues that need immediate attention. If you send a my chart message, it may take a few days to be addressed, specially if I am not in the office.  Please be sure medication list is accurate. If a new problem present, please set up appointment sooner than planned today.  Health Maintenance, Female Adopting a healthy lifestyle and getting preventive care are important in promoting health and wellness. Ask your health care provider about: The right schedule for you to have regular tests and exams. Things you can do on your own to prevent diseases and keep yourself healthy. What should I know about diet, weight, and exercise? Eat a healthy diet  Eat a diet that includes plenty of vegetables, fruits, low-fat dairy products, and lean protein. Do not eat a lot of foods that are high in solid fats, added sugars, or sodium. Maintain a healthy weight Body mass index (BMI) is used to identify weight problems. It estimates body fat based on height and weight. Your health care provider can help determine your BMI and help you achieve or maintain a healthy weight. Get regular exercise Get regular exercise. This is one of the most important things you can do for your health. Most adults should: Exercise for at least 150 minutes each week. The exercise should increase your heart rate and make you sweat (moderate-intensity exercise). Do  strengthening exercises at least twice a week. This is in addition to the moderate-intensity exercise. Spend less time sitting. Even light physical activity can be beneficial. Watch cholesterol and blood lipids Have your blood tested for lipids and cholesterol at 60 years of age, then have this test every 5 years. Have your cholesterol levels checked more often if: Your lipid or cholesterol levels are high. You are older than 60 years of age. You are at high risk for heart disease. What should I know about cancer screening? Depending on your health history and family history, you may need to have cancer screening at various ages. This may include screening for: Breast cancer. Cervical cancer. Colorectal cancer. Skin cancer. Lung cancer. What should I know about heart disease, diabetes, and high blood pressure? Blood pressure and heart disease High blood pressure causes heart disease and increases the risk of stroke. This is more likely to develop in people who have high blood pressure readings or are overweight. Have your blood pressure checked: Every 3-5 years if you are 58-22 years of age. Every year if you are 71 years old or older. Diabetes Have regular diabetes screenings. This checks your fasting blood sugar level. Have the screening done: Once every three years after age 66 if you are at a normal weight and have a low risk for diabetes. More often and at a younger age if you are overweight or have a high risk for diabetes. What should I know about preventing infection? Hepatitis B If you have a higher risk for hepatitis B, you should be screened for this virus. Talk with your health care provider to  find out if you are at risk for hepatitis B infection. Hepatitis C Testing is recommended for: Everyone born from [birth year] through 1963. Anyone with known risk factors for hepatitis C. Sexually transmitted infections (STIs) Get screened for STIs, including gonorrhea and chlamydia,  if: You are sexually active and are younger than 60 years of age. You are older than 61 years of age and your health care provider tells you that you are at risk for this type of infection. Your sexual activity has changed since you were last screened, and you are at increased risk for chlamydia or gonorrhea. Ask your health care provider if you are at risk. Ask your health care provider about whether you are at high risk for HIV. Your health care provider may recommend a prescription medicine to help prevent HIV infection. If you choose to take medicine to prevent HIV, you should first get tested for HIV. You should then be tested every 3 months for as long as you are taking the medicine. Pregnancy If you are about to stop having your period (premenopausal) and you may become pregnant, seek counseling before you get pregnant. Take 400 to 800 micrograms (mcg) of folic acid every day if you become pregnant. Ask for birth control (contraception) if you want to prevent pregnancy. Osteoporosis and menopause Osteoporosis is a disease in which the bones lose minerals and strength with aging. This can result in bone fractures. If you are 24 years old or older, or if you are at risk for osteoporosis and fractures, ask your health care provider if you should: Be screened for bone loss. Take a calcium or vitamin D supplement to lower your risk of fractures. Be given hormone replacement therapy (HRT) to treat symptoms of menopause. Follow these instructions at home: Alcohol use Do not drink alcohol if: Your health care provider tells you not to drink. You are pregnant, may be pregnant, or are planning to become pregnant. If you drink alcohol: Limit how much you have to: 0-1 drink a day. Know how much alcohol is in your drink. In the [place] one drink equals one 12 oz bottle of beer (355 mL), one 5 oz glass of wine (148 mL), or one 1 oz glass of hard liquor (44 mL). Lifestyle Do not use any products that  contain nicotine or tobacco. These products include cigarettes, chewing tobacco, and vaping devices, such as e-cigarettes. If you need help quitting, ask your health care provider. Do not use street drugs. Do not share needles. Ask your health care provider for help if you need support or information about quitting drugs. General instructions Schedule regular health, dental, and eye exams. Stay current with your vaccines. Tell your health care provider if: You often feel depressed. You have ever been abused or do not feel safe at home. Summary Adopting a healthy lifestyle and getting preventive care are important in promoting health and wellness. Follow your health care provider's instructions about healthy diet, exercising, and getting tested or screened for diseases. Follow your health care provider's instructions on monitoring your cholesterol and blood pressure. This information is not intended to replace advice given to you by your health care provider. Make sure you discuss any questions you have with your health care provider. Document Revised: 12/19/2020 Document Reviewed: 12/19/2020 Elsevier Patient Education  [place].

## 2022-10-04 ENCOUNTER — Other Ambulatory Visit (INDEPENDENT_AMBULATORY_CARE_PROVIDER_SITE_OTHER): Payer: BC Managed Care – PPO

## 2022-10-04 DIAGNOSIS — E785 Hyperlipidemia, unspecified: Secondary | ICD-10-CM | POA: Diagnosis not present

## 2022-10-04 DIAGNOSIS — E1169 Type 2 diabetes mellitus with other specified complication: Secondary | ICD-10-CM

## 2022-10-04 LAB — LIPID PANEL
Cholesterol: 161 mg/dL (ref 0–200)
HDL: 76 mg/dL (ref >39.00)
LDL Cholesterol: 75 mg/dL (ref 0–99)
NonHDL: 84.95
Total CHOL/HDL Ratio: 2
Triglycerides: 52 mg/dL (ref 0.0–149.0)
VLDL: 10.4 mg/dL (ref 0.0–40.0)

## 2022-10-04 LAB — COMPREHENSIVE METABOLIC PANEL
ALT: 35 U/L (ref 0–35)
AST: 29 U/L (ref 0–37)
Albumin: 4.2 g/dL (ref 3.5–5.2)
Alkaline Phosphatase: 106 U/L (ref 39–117)
BUN: 23 mg/dL (ref 6–23)
CO2: 27 mEq/L (ref 19–32)
Calcium: 9.5 mg/dL (ref 8.4–10.5)
Chloride: 105 mEq/L (ref 96–112)
Creatinine, Ser: 0.83 mg/dL (ref 0.40–1.20)
GFR: 77.08 mL/min (ref >60.00)
Glucose, Bld: 114 mg/dL — ABNORMAL HIGH (ref 70–99)
Potassium: 4 mEq/L (ref 3.5–5.1)
Sodium: 139 mEq/L (ref 135–145)
Total Bilirubin: 0.7 mg/dL (ref 0.2–1.2)
Total Protein: 7.6 g/dL (ref 6.0–8.3)

## 2022-10-04 LAB — MICROALBUMIN / CREATININE URINE RATIO
Creatinine,U: 112.6 mg/dL
Microalb Creat Ratio: 0.9 mg/g (ref 0.0–30.0)
Microalb, Ur: 1 mg/dL (ref 0.0–1.9)

## 2022-10-05 DIAGNOSIS — M25632 Stiffness of left wrist, not elsewhere classified: Secondary | ICD-10-CM | POA: Diagnosis not present

## 2022-10-10 ENCOUNTER — Other Ambulatory Visit (HOSPITAL_BASED_OUTPATIENT_CLINIC_OR_DEPARTMENT_OTHER): Payer: Self-pay

## 2022-10-10 ENCOUNTER — Encounter (HOSPITAL_BASED_OUTPATIENT_CLINIC_OR_DEPARTMENT_OTHER): Payer: Self-pay

## 2022-10-10 ENCOUNTER — Other Ambulatory Visit: Payer: Self-pay

## 2022-10-10 ENCOUNTER — Telehealth (INDEPENDENT_AMBULATORY_CARE_PROVIDER_SITE_OTHER): Payer: BC Managed Care – PPO | Admitting: Neurology

## 2022-10-10 ENCOUNTER — Ambulatory Visit: Payer: BC Managed Care – PPO | Admitting: Family Medicine

## 2022-10-10 ENCOUNTER — Emergency Department (HOSPITAL_BASED_OUTPATIENT_CLINIC_OR_DEPARTMENT_OTHER)
Admission: EM | Admit: 2022-10-10 | Discharge: 2022-10-10 | Disposition: A | Discharge status: Home / Self Care | Payer: BC Managed Care – PPO | Attending: Emergency Medicine | Admitting: Emergency Medicine

## 2022-10-10 ENCOUNTER — Encounter: Payer: Self-pay | Admitting: Neurology

## 2022-10-10 DIAGNOSIS — G43109 Migraine with aura, not intractable, without status migrainosus: Secondary | ICD-10-CM | POA: Diagnosis not present

## 2022-10-10 DIAGNOSIS — Z20822 Contact with and (suspected) exposure to covid-19: Secondary | ICD-10-CM | POA: Insufficient documentation

## 2022-10-10 DIAGNOSIS — Z7985 Long-term (current) use of injectable non-insulin antidiabetic drugs: Secondary | ICD-10-CM | POA: Diagnosis not present

## 2022-10-10 DIAGNOSIS — B974 Respiratory syncytial virus as the cause of diseases classified elsewhere: Secondary | ICD-10-CM | POA: Insufficient documentation

## 2022-10-10 DIAGNOSIS — E119 Type 2 diabetes mellitus without complications: Secondary | ICD-10-CM | POA: Diagnosis not present

## 2022-10-10 DIAGNOSIS — I10 Essential (primary) hypertension: Secondary | ICD-10-CM | POA: Insufficient documentation

## 2022-10-10 DIAGNOSIS — R059 Cough, unspecified: Secondary | ICD-10-CM | POA: Diagnosis not present

## 2022-10-10 DIAGNOSIS — B338 Other specified viral diseases: Secondary | ICD-10-CM

## 2022-10-10 DIAGNOSIS — J029 Acute pharyngitis, unspecified: Secondary | ICD-10-CM | POA: Diagnosis not present

## 2022-10-10 LAB — RESP PANEL BY RT-PCR (RSV, FLU A&B, COVID)  RVPGX2
Influenza A by PCR: NEGATIVE
Influenza B by PCR: NEGATIVE
Resp Syncytial Virus by PCR: POSITIVE — AB
SARS Coronavirus 2 by RT PCR: NEGATIVE

## 2022-10-10 MED ORDER — AMITRIPTYLINE HCL 10 MG PO TABS: ORAL_TABLET | ORAL | 3 refills | Status: DC

## 2022-10-10 MED ORDER — ONDANSETRON HCL 4 MG PO TABS
4.0000 mg | ORAL_TABLET | Freq: Three times a day (TID) | ORAL | 0 refills | Status: DC | PRN
  Filled 2022-10-10: qty 12, 4d supply, fill #0

## 2022-10-10 MED ORDER — BENZONATATE 100 MG PO CAPS
100.0000 mg | ORAL_CAPSULE | Freq: Three times a day (TID) | ORAL | 0 refills | Status: DC | PRN
  Filled 2022-10-10: qty 21, 7d supply, fill #0

## 2022-10-10 MED ORDER — IBUPROFEN 400 MG PO TABS
600.0000 mg | ORAL_TABLET | Freq: Once | ORAL | Status: AC
  Administered 2022-10-10: 600 mg via ORAL
  Filled 2022-10-10: qty 1

## 2022-10-10 MED ORDER — ONDANSETRON 4 MG PO TBDP
8.0000 mg | ORAL_TABLET | Freq: Once | ORAL | Status: AC
  Administered 2022-10-10: 8 mg via ORAL
  Filled 2022-10-10: qty 2

## 2022-10-10 NOTE — Progress Notes (Signed)
Virtual Visit via Video Note The purpose of this virtual visit is to provide medical care while limiting exposure to the novel coronavirus.    Consent was obtained for video visit:  Yes.   Answered questions that patient had about telehealth interaction:  Yes.     Pt location: Home Physician Location: office Name of referring provider:  Martinique, Betty G, MD I connected with [name] at patients initiation/request on 10/10/2022 at  3:00 PM EST by video enabled telemedicine application and verified that I am speaking with the correct person using two identifiers. Pt MRN:  [id number] Pt DOB:  [birth year] Video Participants:  [name]   History of Present Illness:  The patient had a virtual video visit on 10/10/2022. She was last seen in the neurology clinic a year ago. She went to Urgent Care earlier today and was diagnosed with RSV. She has been having respiratory symptoms for the past 2 days. Prior to this, she reports she has been doing really good. She is seen in our office for dizziness felt secondary to migraines. MRI brain normal. She has been to [facility] and [facility] for second opinions. [facility] felt symptoms likely migraine-related dizziness. [facility] noted vestibular migraines is a diagnosis of exclusion and wondered if hypertensive urgency could be causing some of her symptoms. She notes that the past year, she has not had any problems with being dizzy. No headaches. Sometimes she may feel a little weird when driving, it lasts for a second then resolves. She takes the amitriptyline '10mg'$ , taking 1-2 tablets 2-3 times a week to keep it in her system. She denies any focal numbness/tingling/weakness. No falls.    History on Initial Assessment 03/16/2016: This is a pleasant 62 yo RH woman with a history of diabetes, GERD, with dizziness. The symptoms started on 01/06/16 while she was driving home when she started feeling as if she would pass out, dizzy with a spinning  sensation. She pulled over and called her husband, but was able to eventually drive home. She noticed she would veer to the right when walking. She saw her PCP and was noted to have nystagmus with Dix-Hallpike testing and impacted cerumen on the left. She returned with ear fullness on the right and again had cerumen disimpaction. She continued to have the dizziness, improved with meclizine, but this caused drowsiness. She underwent vestibular therapy with significant improvement in the dizziness. The spinning sensation has resolved, however she continues to have a residual "floating" sensation particularly noticeable when she is driving. This would be associated with nausea, no vomiting. If she is driving and staying focused forward, she starts feeling "not normal," her ears will pop, "almost like a pressure," then this would be relieved when she looks off to the side. She constantly feels that there is a glare in her vision, more at night, when things do not seem clear. She had an eye exam which was normal. She has seen ENT and has been told she may have had a viral illness, given a steroid injection last June. She reports a history of herpes simplex from many years ago, when stressed out she feels a "nerve pain" on her arms without any visible lesions. She denies any associated headaches, focal numbness/tingling/weakness. She reports that 3 months prior to the onset of these symptoms, she noticed she would feel lightheaded in the morning while driving. No diplopia, dysarthria/dysphagia, bowel/bladder dysfunction. She has some left-sided neck pain that improved with PT. She  denies any head injuries or recent infections. She had been taking Phentermine for weight loss, but stopped it in May when she started having the neck pain. She denies any daytime drowsiness, sleep is good.    Current Outpatient Medications on File Prior to Visit  Medication Sig Dispense Refill   amitriptyline (ELAVIL) 10 MG tablet TAKE 2  TABLETS BY MOUTH EVERY DAY AT NIGHT (Patient taking differently: at bedtime as needed. TAKE 2 TABLETS BY MOUTH EVERY DAY AT NIGHT) 180 tablet 3   glucose blood test strip Onetouch Delica test strips; UAD to monitor glucose bid;Dx:E11.9 100 each 12   ibuprofen (ADVIL) 200 MG tablet Take 200 mg by mouth every 6 (six) hours as needed.     OneTouch Delica Lancets 99991111 MISC UAD to monitor glucose bid;Dx E11.9 100 each 12   rosuvastatin (CRESTOR) 20 MG tablet Take 1 tablet (20 mg total) by mouth daily. 90 tablet 2   Semaglutide, 1 MG/DOSE, 4 MG/3ML SOPN Inject 1 mg as directed once a week. 9 mL 1   No current facility-administered medications on file prior to visit.     Observations/Objective:   Vitals:   10/10/22 1358  Weight: 184 lb (83.5 kg)  Height: 5' (1.524 m)   GEN:  The patient appears stated age and is in NAD.  Neurological examination: Patient is awake, alert. No aphasia or dysarthria. Intact fluency and comprehension. Cranial nerves: Extraocular movements intact. No facial asymmetry. Motor: moves all extremities symmetrically, at least anti-gravity x 4.    Assessment and Plan:   This is a pleasant 62 yo RH woman with a history of diabetes, GERD who presented with dizziness that started in May 2017. The dizziness was initially vertiginous with spinning sensation, that resolved with vestibular therapy, however she continued to have a residual "floating" sensation when driving, with associated nausea. Exam and brain MRI normal. She underwent vestibular testing and an audiogram at [facility] with normal findings, no evidence of peripheral or central vestibular dysfunction, and symptoms were felt to be due to migraine-associated dizziness. A second opinion from [facility] indicated the possibility of BP-related symptoms. She has been doing well the past year, she has amitriptyline for migraine prophylaxis, taking 1-2 tablets 2-3 times a week with continued good effect. Follow-up in 1 year, call  for any changes.    Follow Up Instructions:   -I discussed the assessment and treatment plan with the patient. The patient was provided an opportunity to ask questions and all were answered. The patient agreed with the plan and demonstrated an understanding of the instructions.   The patient was advised to call back or seek an in-person evaluation if the symptoms worsen or if the condition fails to improve as anticipated.     Cameron Sprang, MD

## 2022-10-10 NOTE — Progress Notes (Deleted)
ACUTE VISIT No chief complaint on file.  HPI: Ms.Isma H Dissinger is a 62 y.o. female, who is here today complaining of *** HPI  Review of Systems See other pertinent positives and negatives in HPI.  Current Outpatient Medications on File Prior to Visit  Medication Sig Dispense Refill   amitriptyline (ELAVIL) 10 MG tablet TAKE 2 TABLETS BY MOUTH EVERY DAY AT NIGHT 180 tablet 3   glucose blood test strip Onetouch Delica test strips; UAD to monitor glucose bid;Dx:E11.9 100 each 12   OneTouch Delica Lancets 99991111 MISC UAD to monitor glucose bid;Dx E11.9 100 each 12   rosuvastatin (CRESTOR) 20 MG tablet Take 1 tablet (20 mg total) by mouth daily. 90 tablet 2   Semaglutide, 1 MG/DOSE, 4 MG/3ML SOPN Inject 1 mg as directed once a week. 9 mL 1   No current facility-administered medications on file prior to visit.    Past Medical History:  Diagnosis Date   Allergic rhinitis    Allergy    SEASONAL- pt states she has not had issues in "years"   Anemia    Cardiac murmur    1/6 SEM at [facility] (old finding per patient)- no heart murmur per Dr. 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: [employer]  Tobacco Use   Smoking status: Never   Smokeless tobacco: Never  Vaping Use   Vaping Use: Never used  Substance and Sexual Activity   Alcohol use: Yes    Comment: occ   Drug use: No   Sexual activity: Yes     Birth control/protection: Post-menopausal  Other Topics Concern   Not on file  Social History Narrative   12/07/19   From: the area   Living: with husband, Juanda Crumble   Work: Teacher, English as a foreign language at [employer]   Education: [employer] grad, [place] 2013      Family: Step son - Doha Harker       Enjoys: sing - band shows      Exercise: walking on the treadmill    Diet: not doing great with this, trying to cut back on carb intake      Safety   Seat belts: Yes    Guns: Yes  and secure   Safe in relationships: Yes     left handed    Social Determinants of Health   Financial Resource Strain: Not on file  Food Insecurity: Not on file  Transportation Needs: Not on file  Physical Activity: Not on file  Stress: Not on file  Social Connections: Not on file    There were no vitals filed for this visit. There is no height or weight on file to calculate BMI.  Physical Exam  ASSESSMENT AND PLAN: There are no diagnoses linked to this encounter.  No follow-ups on file.  Betty G. Martinique, MD  [facility]. [place] office.  Discharge Instructions   None

## 2022-10-10 NOTE — ED Triage Notes (Signed)
She c/o uri sx plus sore throat x 2 days. She is in no distress.

## 2022-10-10 NOTE — ED Provider Notes (Signed)
[facility] Provider Note  CSN: [id number] Arrival date & time: 10/10/22 [id number]  Chief Complaint(s) URI  HPI Linda [name] is a 62 y.o. female with history of diabetes, hypertension, hyperlipidemia, presenting to the emergency department with sore throat.  Patient reports sore throat for 2 days, reports associated nausea, congestion, cough, body aches.  No fevers or chills.  No vomiting, abdominal pain or diarrhea.  No dysuria.  No productive cough.  No shortness of breath or chest pain.  Has sick contacts at work.   Past Medical History Past Medical History:  Diagnosis Date   Allergic rhinitis    Allergy    SEASONAL- pt states she has not had issues in "years"   Anemia    Cardiac murmur    1/6 SEM at [facility] (old finding per patient)- no heart murmur per Dr. Deborra Medina   Diabetes mellitus without complication ([place])    GERD (gastroesophageal reflux disease)    Hyperlipidemia    Hypertension    Neuralgic migraines    "silent migraines"   Obesity    Vertigo    Patient Active Problem List   Diagnosis Date Noted   Hiatal hernia 08/24/2020   Anemia 07/26/2020   Hypertension associated with diabetes ([place]) 12/07/2019   Acute pain of right shoulder 12/07/2019   Spasm of cervical paraspinous muscle 08/06/2019   Colon polyp 03/20/2019   Headache disorder 07/30/2018   HLD (hyperlipidemia) 05/07/2018   Vertiginous migraine 01/25/2017   Vestibular migraine 06/12/2016   BPV (benign positional vertigo) 05/08/2016   Dizziness 01/16/2016   Esophageal reflux 04/21/2014   Dyspepsia 03/31/2014   Class 2 obesity with body mass index (BMI) of 35.0 to 35.9 in adult 01/13/2013   Cardiac murmur 01/04/2012   Routine general medical examination at a health care facility 11/20/2011   Type 2 diabetes mellitus with other specified complication ([place]) [id number]   GERD with esophagitis 10/09/2011   Allergic rhinitis, cause unspecified 12/19/2006   Home  Medication(s) Prior to Admission medications   Medication Sig Start Date End Date Taking? Authorizing Provider  benzonatate (TESSALON) 100 MG capsule Take 1 capsule (100 mg total) by mouth 3 (three) times daily as needed for cough. 10/10/22  Yes Cristie Hem, MD  ondansetron (ZOFRAN) 4 MG tablet Take 1 tablet (4 mg total) by mouth every 8 (eight) hours as needed for nausea or vomiting. 10/10/22  Yes Cristie Hem, MD  amitriptyline (ELAVIL) 10 MG tablet TAKE 2 TABLETS BY MOUTH EVERY DAY AT NIGHT 12/15/21   Cameron Sprang, MD  glucose blood test strip [facility] test strips; UAD to monitor glucose bid;Dx:E11.9 03/26/19   Lucille Passy, MD  OneTouch Delica Lancets 99991111 MISC UAD to monitor glucose bid;Dx E11.9 03/26/19   Lucille Passy, MD  rosuvastatin (CRESTOR) 20 MG tablet Take 1 tablet (20 mg total) by mouth daily. 09/24/21   Martinique, Betty G, MD  Semaglutide, 1 MG/DOSE, 4 MG/3ML SOPN Inject 1 mg as directed once a week. 10/03/22   Martinique, Betty G, MD  Past Surgical History Past Surgical History:  Procedure Laterality Date   COLONOSCOPY     CYST EXCISION N/A 02/09/2020   Procedure: EXCISION UPPER BACK CYST;  Surgeon: Coralie Keens, MD;  Location: [facility];  Service: General;  Laterality: N/A;   fibroid ablation     skin graft for burn     trigger thumb     UPPER GASTROINTESTINAL ENDOSCOPY     Family History Family History  Problem Relation Age of Onset   Hypertension Mother    Early death Father        car accident   Diabetes Maternal Aunt    Renal Disease Maternal Aunt    Heart disease Maternal Grandmother    Heart disease Paternal Grandmother    Colon cancer Neg Hx    Stomach cancer Neg Hx    Pancreatic cancer Neg Hx    Esophageal cancer Neg Hx    Liver disease Neg Hx    Rectal cancer Neg Hx     Social History Social  History   Tobacco Use   Smoking status: Never   Smokeless tobacco: Never  Vaping Use   Vaping Use: Never used  Substance Use Topics   Alcohol use: Yes    Comment: occ   Drug use: No   Allergies Morphine, Lisinopril, and Topamax [topiramate]  Review of Systems Review of Systems  All other systems reviewed and are negative.   Physical Exam Vital Signs  I have reviewed the triage vital signs BP (!) 173/87 (BP Location: Right Arm)   Pulse 82   Temp 97.9 F (36.6 C)   Resp 18   LMP 08/28/2015 (Approximate)   SpO2 99%  Physical Exam Vitals and nursing note reviewed.  Constitutional:      General: She is not in acute distress.    Appearance: She is well-developed.  HENT:     Head: Normocephalic and atraumatic.     Mouth/Throat:     Mouth: Mucous membranes are moist.     Pharynx: Posterior oropharyngeal erythema present. No oropharyngeal exudate.  Eyes:     Pupils: Pupils are equal, round, and reactive to light.  Cardiovascular:     Rate and Rhythm: Normal rate and regular rhythm.     Heart sounds: No murmur heard. Pulmonary:     Effort: Pulmonary effort is normal. No respiratory distress.     Breath sounds: Normal breath sounds.  Abdominal:     General: Abdomen is flat.     Palpations: Abdomen is soft.     Tenderness: There is no abdominal tenderness.  Musculoskeletal:        General: No tenderness.     Right lower leg: No edema.     Left lower leg: No edema.  Skin:    General: Skin is warm and dry.  Neurological:     General: No focal deficit present.     Mental Status: She is alert. Mental status is at baseline.  Psychiatric:        Mood and Affect: Mood normal.        Behavior: Behavior normal.     ED Results and Treatments Labs (all labs ordered are listed, but only abnormal results are displayed) Labs Reviewed  RESP PANEL BY RT-PCR (RSV, FLU A&B, COVID)  RVPGX2 - Abnormal; Notable for the following components:      Result Value   Resp Syncytial  Virus by PCR POSITIVE (*)    All other components within normal limits  Radiology No results found.  Pertinent labs & imaging results that were available during my care of the patient were reviewed by me and considered in my medical decision making (see MDM for details).  Medications Ordered in ED Medications  ibuprofen (ADVIL) tablet 600 mg (600 mg Oral Given 10/10/22 1021)  ondansetron (ZOFRAN-ODT) disintegrating tablet 8 mg (8 mg Oral Given 10/10/22 1021)                                                                                                                                     Procedures Procedures  (including critical care time)  Medical Decision Making / ED Course   MDM:  62 year old female presenting to the emergency department with sore throat.  Patient's constellation of symptoms consistent with upper respiratory tract infection.  Examination overall reassuring.  No focal pulmonary sounds.  No signs of dangerous cause of sore throat such as Ludwig's angina, retropharyngeal abscess or peritonsillar abscess.  Will check flu/COVID swab as patient would qualify for antiviral treatment for flu or COVID given diabetes  Clinical Course as of 10/10/22 1230  Wed Oct 10, 2022  1228 RSV swab positive. Recommended supportive care. Will prescribe zofran and tessalon. Will discharge patient to home. All questions answered. Patient comfortable with plan of discharge. Return precautions discussed with patient and specified on the after visit summary. [WS]    Clinical Course User Index [WS] Cristie Hem, MD     Additional history obtained: -External records from outside source obtained and reviewed including: Chart review including previous notes, labs, imaging, consultation notes including PMD visit 10/03/21   Lab Tests: -I ordered, reviewed, and  interpreted labs.   The pertinent results include:   Labs Reviewed  RESP PANEL BY RT-PCR (RSV, FLU A&B, COVID)  RVPGX2 - Abnormal; Notable for the following components:      Result Value   Resp Syncytial Virus by PCR POSITIVE (*)    All other components within normal limits    Notable for positive PCR for RSV     Medicines ordered and prescription drug management: Meds ordered this encounter  Medications   ibuprofen (ADVIL) tablet 600 mg   ondansetron (ZOFRAN-ODT) disintegrating tablet 8 mg   ondansetron (ZOFRAN) 4 MG tablet    Sig: Take 1 tablet (4 mg total) by mouth every 8 (eight) hours as needed for nausea or vomiting.    Dispense:  12 tablet    Refill:  0   benzonatate (TESSALON) 100 MG capsule    Sig: Take 1 capsule (100 mg total) by mouth 3 (three) times daily as needed for cough.    Dispense:  21 capsule    Refill:  0    -I have reviewed the patients home medicines and have made adjustments as needed     Reevaluation: After the interventions noted above, I reevaluated the patient and found that they have improved  Co morbidities that  complicate the patient evaluation  Past Medical History:  Diagnosis Date   Allergic rhinitis    Allergy    SEASONAL- pt states she has not had issues in "years"   Anemia    Cardiac murmur    1/6 SEM at [facility] (old finding per patient)- no heart murmur per Dr. Deborra Medina   Diabetes mellitus without complication (HCC)    GERD (gastroesophageal reflux disease)    Hyperlipidemia    Hypertension    Neuralgic migraines    "silent migraines"   Obesity    Vertigo       Dispostion: Disposition decision including need for hospitalization was considered, and patient discharged from emergency department.    Final Clinical Impression(s) / ED Diagnoses Final diagnoses:  RSV (respiratory syncytial virus infection)     This chart was dictated using voice recognition software.  Despite best efforts to proofread,  errors can occur which  can change the documentation meaning.    Cristie Hem, MD 10/10/22 1230

## 2022-10-10 NOTE — Discharge Instructions (Addendum)
We evaluated you for your upper respiratory tract symptoms.  Your viral swab was positive for RSV.  Please take Tylenol and Motrin for your symptoms at home.  You can take 650 mg of Tylenol every 6 hours and 600 mg of ibuprofen every 6 hours as needed for your symptoms.  You can take these medicines together as needed, either at the same time, or alternating every 3 hours.  I prescribed you cough medicine and nausea medicine.  You can take these every 8 hours as needed for your cough and nausea.  Please return to the emergency department if you develop any worsening symptoms such as difficulty breathing, chest pain, lightheadedness or dizziness, fainting, or any other concerning symptoms

## 2022-10-10 NOTE — ED Notes (Signed)
Swabs taken to the lab

## 2022-10-10 NOTE — Patient Instructions (Signed)
I hope you feel better soon! Refills sent for amitriptyline. Follow-up in 1 year, call for any changes.

## 2022-10-11 ENCOUNTER — Encounter: Payer: Self-pay | Admitting: Family Medicine

## 2022-10-12 ENCOUNTER — Telehealth: Payer: Self-pay

## 2022-10-12 NOTE — Patient Outreach (Signed)
  Care Coordination   Initial Visit Note   10/12/2022 Name: LORINA DENDY MRN: [id number] DOB: 08/01/1961  Penne Lash Dimitroff is a 62 y.o. year old female who sees Martinique, Malka So, MD for primary care. Incoming call from patient returning RN CM call.   What matters to the patients health and wellness today?  Patient is recovering from being diagnosed with RSV during recent ED visit on 10/10/22. She shares that yesterday she felt her worse and had increased sxs. However, today she is feeling a little better and feels like she is getting on the other side of this. She is taking meds to help manage sxs. Patient interested in getting RSV vaccine and will discuss with MD. She states she did not get flu vaccine this year but will resume getting it annually after she recovers. Discussed ways to boost her immune system. Denies any RN CM needs or concerns at this time.      SDOH assessments and interventions completed:  Yes  SDOH Interventions Today    Flowsheet Row Most Recent Value  SDOH Interventions   Food Insecurity Interventions Intervention Not Indicated  Transportation Interventions Intervention Not Indicated        Care Coordination Interventions:  Yes, provided    Interventions Today    Flowsheet Row Most Recent Value  General Interventions   General Interventions Discussed/Reviewed General Interventions Discussed, Vaccines  Nutrition Interventions   Nutrition Discussed/Reviewed Nutrition Discussed  Pharmacy Interventions   Pharmacy Dicussed/Reviewed Pharmacy Topics Discussed, Medications and their functions       Follow up plan: No further intervention required.   Encounter Outcome:  Pt. Visit Completed    Hetty Blend [facility] Care Management Care Management Community Coordinator Direct Phone: [phone] Toll Free: [phone] Fax: [phone]

## 2022-10-12 NOTE — Patient Outreach (Signed)
  Care Coordination   10/12/2022 Name: Linda [name] MRN: [id number] DOB: 1961-05-09   Care Coordination Outreach Attempts:  An unsuccessful telephone outreach was attempted today to offer the patient information about available care coordination services as a benefit of their health plan.   Follow Up Plan:  Additional outreach attempts will be made to offer the patient care coordination information and services.   Encounter Outcome:  No Answer   Care Coordination Interventions:  No, not indicated      Enzo Montgomery, RN,BSN,CCM [facility] Care Management Care Management Community Coordinator Direct Phone: [phone] Toll Free: [phone] Fax: [phone]

## 2022-10-22 DIAGNOSIS — G5622 Lesion of ulnar nerve, left upper limb: Secondary | ICD-10-CM | POA: Diagnosis not present

## 2022-10-22 DIAGNOSIS — G5603 Carpal tunnel syndrome, bilateral upper limbs: Secondary | ICD-10-CM | POA: Diagnosis not present

## 2022-11-02 DIAGNOSIS — M79645 Pain in left finger(s): Secondary | ICD-10-CM | POA: Diagnosis not present

## 2022-11-02 DIAGNOSIS — M25632 Stiffness of left wrist, not elsewhere classified: Secondary | ICD-10-CM | POA: Diagnosis not present

## 2022-11-21 ENCOUNTER — Other Ambulatory Visit: Payer: Self-pay | Admitting: Family Medicine

## 2022-12-26 ENCOUNTER — Ambulatory Visit (HOSPITAL_BASED_OUTPATIENT_CLINIC_OR_DEPARTMENT_OTHER): Payer: BC Managed Care – PPO | Admitting: Student

## 2022-12-26 ENCOUNTER — Other Ambulatory Visit (HOSPITAL_BASED_OUTPATIENT_CLINIC_OR_DEPARTMENT_OTHER): Payer: Self-pay

## 2022-12-26 DIAGNOSIS — M629 Disorder of muscle, unspecified: Secondary | ICD-10-CM | POA: Diagnosis not present

## 2022-12-26 MED ORDER — METHOCARBAMOL 500 MG PO TABS
500.0000 mg | ORAL_TABLET | Freq: Four times a day (QID) | ORAL | 0 refills | Status: AC
  Filled 2022-12-26: qty 28, 7d supply, fill #0

## 2022-12-26 NOTE — Progress Notes (Signed)
Chief Complaint: Right upper back pain     History of Present Illness:    Linda [name] is a 62 y.o. female presenting today for evaluation of right-sided upper back and neck pain.  It has been ongoing for the last 3 days without any injury.  The pain is described as dull and is located mostly between the spine and right shoulder, occasionally radiating about halfway down the right arm.  She notes full range of motion with the right shoulder however does have some increased discomfort moving through shoulder extension.  Does have some occasional tingling in the right hand however she does have history of carpal tunnel syndrome and has had a release on the left but not the right.  She has tried ibuprofen which has not not reduce the pain.  Does have a history of vestibular migraines.  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    Surgical History:   None  PMH/PSH/Family History/Social History/Meds/Allergies:    Past Medical History:  Diagnosis Date   Allergic rhinitis    Allergy    SEASONAL- pt states she has not had issues in "years"   Anemia    Cardiac murmur    1/6 SEM at [facility] (old finding per patient)- no heart murmur per Dr. Dayton Martes   Carpal tunnel syndrome 2023   Cubital tunnel syndrome 2023   Diabetes mellitus without complication (HCC)    GERD (gastroesophageal reflux disease)    Hyperlipidemia    Hypertension    Neuralgic migraines    "silent migraines"   Obesity    Vertigo    Past Surgical History:  Procedure Laterality Date   COLONOSCOPY     CYST EXCISION N/A 02/09/2020   Procedure: EXCISION UPPER BACK CYST;  Surgeon: Abigail Miyamoto, MD;  Location: [facility];  Service: General;  Laterality: N/A;   fibroid ablation     skin graft for burn     trigger thumb     UPPER GASTROINTESTINAL ENDOSCOPY     Social History   Socioeconomic History   Marital status: Married    Spouse name: Charles   Number of  children: 0   Years of education: Masters   Highest education level: Not on file  Occupational History   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: [employer]  Tobacco Use   Smoking status: Never   Smokeless tobacco: Never  Vaping Use   Vaping Use: Never used  Substance and Sexual Activity   Alcohol use: Yes    Comment: occ   Drug use: No   Sexual activity: Yes    Birth control/protection: Post-menopausal  Other Topics Concern   Not on file  Social History Narrative   12/07/19   From: the area   Living: with husband, Leonette Most   Work: Teacher, English as a foreign language at [employer]   Education: [employer] grad, [place] 2013      Family: Step son - Ellsa Cebulski       Enjoys: sing - band shows      Exercise: walking on the treadmill    Diet: not doing great with this, trying to cut back on carb intake      Safety   Seat belts: Yes    Guns: Yes  and secure   Safe in relationships:  Yes     left handed    Social Determinants of Health   Financial Resource Strain: Not on file  Food Insecurity: No Food Insecurity (10/12/2022)   Hunger Vital Sign    Worried About Running Out of Food in the Last Year: Never true    Ran Out of Food in the Last Year: Never true  Transportation Needs: No Transportation Needs (10/12/2022)   PRAPARE - Administrator, Civil Service (Medical): No    Lack of Transportation (Non-Medical): No  Physical Activity: Not on file  Stress: Not on file  Social Connections: Not on file   Family History  Problem Relation Age of Onset   Hypertension Mother    Early death Father        car accident   Diabetes Maternal Aunt    Renal Disease Maternal Aunt    Heart disease Maternal Grandmother    Heart disease Paternal Grandmother    Colon cancer Neg Hx    Stomach cancer Neg Hx    Pancreatic cancer Neg Hx    Esophageal cancer Neg Hx    Liver disease Neg Hx    Rectal cancer Neg Hx    Allergies  Allergen Reactions   Morphine Itching    Hydrocodone is OK    Lisinopril Swelling    Numbness and swelling of lips    Topamax [Topiramate] Rash   Current Outpatient Medications  Medication Sig Dispense Refill   methocarbamol (ROBAXIN) 500 MG tablet Take 1 tablet (500 mg total) by mouth 4 (four) times daily for 7 days. 28 tablet 0   amitriptyline (ELAVIL) 10 MG tablet TAKE 2 TABLETS BY MOUTH EVERY DAY AT NIGHT 180 tablet 3   benzonatate (TESSALON) 100 MG capsule Take 1 capsule (100 mg total) by mouth 3 (three) times daily as needed for cough. 21 capsule 0   glucose blood test strip [employer] test strips; UAD to monitor glucose bid;Dx:E11.9 100 each 12   ibuprofen (ADVIL) 200 MG tablet Take 200 mg by mouth every 6 (six) hours as needed.     ondansetron (ZOFRAN) 4 MG tablet Take 1 tablet (4 mg total) by mouth every 8 (eight) hours as needed for nausea or vomiting. 12 tablet 0   OneTouch Delica Lancets 33G MISC UAD to monitor glucose bid;Dx E11.9 100 each 12   rosuvastatin (CRESTOR) 20 MG tablet TAKE 1 TABLET BY MOUTH EVERY DAY 90 tablet 2   Semaglutide, 1 MG/DOSE, 4 MG/3ML SOPN Inject 1 mg as directed once a week. 9 mL 1   No current facility-administered medications for this visit.   No results found.  Review of Systems:   A ROS was performed including pertinent positives and negatives as documented in the HPI.  Physical Exam :   Constitutional: NAD and appears stated age Neurological: Alert and oriented Psych: Appropriate affect and cooperative Last menstrual period 08/28/2015.   Comprehensive Musculoskeletal Exam:    No tenderness palpation over cervical vertebrae or paraspinal muscles.  Some right-sided neck and trapezius discomfort noted with cervical rotation to the right.  Negative Spurling's.  No tenderness over the thoracic spinous processes.  There is tenderness and tightness noted over rhomboid and upper trapezius distribution mainly on the right side.  There are a few painful trigger points.  Active shoulder range of motion to  160 degrees forward flexion 45 degrees external rotation and internal rotation to L1 bilaterally.  Bilateral grip strength 5/5.  Positive Tinel test at the right wrist.  Imaging:     I personally reviewed and interpreted the radiographs.   Assessment:   62 y.o. female with right-sided upper back and scapular pain.  I believe that her systems are consistent with right upper trap pain due to the tightness and trigger points in this area.  Overall I believe that she would benefit the most from physical therapy with manual techniques, possible dry needling, and strengthening.  I would also like to try her on a muscle relaxer on an as-needed basis but explained that if this changes any of her vestibular symptoms that she can stop at any time.  Recommend use of heat therapy as well.  Will return to clinic as needed.  Plan :    -Referral to physical therapy and return to clinic for reassessment as needed     I personally saw and evaluated the patient, and participated in the management and treatment plan.  Hazle Nordmann, PA-C Orthopedics  This document was dictated using Conservation officer, historic buildings. A reasonable attempt at proof reading has been made to minimize errors.

## 2022-12-27 ENCOUNTER — Telehealth: Payer: Self-pay | Admitting: Anesthesiology

## 2022-12-27 NOTE — Telephone Encounter (Signed)
Pt called stating her vestibular migraines are getting worse, states she is experiencing blurry vision, photophobia and lightheadedness. States her medication is not working anymore. States she would like to get an appointment to see Dr Karel Jarvis some time soon.

## 2022-12-28 ENCOUNTER — Other Ambulatory Visit: Payer: Self-pay

## 2022-12-28 ENCOUNTER — Encounter (HOSPITAL_COMMUNITY): Payer: Self-pay

## 2022-12-28 ENCOUNTER — Emergency Department (HOSPITAL_COMMUNITY): Payer: BC Managed Care – PPO

## 2022-12-28 ENCOUNTER — Other Ambulatory Visit (HOSPITAL_BASED_OUTPATIENT_CLINIC_OR_DEPARTMENT_OTHER): Payer: Self-pay

## 2022-12-28 ENCOUNTER — Emergency Department (HOSPITAL_COMMUNITY)
Admission: EM | Admit: 2022-12-28 | Discharge: 2022-12-28 | Disposition: A | Discharge status: Home / Self Care | Payer: BC Managed Care – PPO | Attending: Emergency Medicine | Admitting: Emergency Medicine

## 2022-12-28 DIAGNOSIS — Z79899 Other long term (current) drug therapy: Secondary | ICD-10-CM | POA: Diagnosis not present

## 2022-12-28 DIAGNOSIS — H539 Unspecified visual disturbance: Secondary | ICD-10-CM

## 2022-12-28 DIAGNOSIS — E119 Type 2 diabetes mellitus without complications: Secondary | ICD-10-CM | POA: Diagnosis not present

## 2022-12-28 DIAGNOSIS — H53141 Visual discomfort, right eye: Secondary | ICD-10-CM | POA: Diagnosis not present

## 2022-12-28 DIAGNOSIS — G9389 Other specified disorders of brain: Secondary | ICD-10-CM | POA: Diagnosis not present

## 2022-12-28 DIAGNOSIS — I10 Essential (primary) hypertension: Secondary | ICD-10-CM | POA: Diagnosis not present

## 2022-12-28 DIAGNOSIS — H547 Unspecified visual loss: Secondary | ICD-10-CM | POA: Diagnosis not present

## 2022-12-28 DIAGNOSIS — H469 Unspecified optic neuritis: Secondary | ICD-10-CM | POA: Diagnosis not present

## 2022-12-28 LAB — CBC WITH DIFFERENTIAL/PLATELET
Abs Immature Granulocytes: 0.01 10*3/uL (ref 0.00–0.07)
Basophils Absolute: 0 10*3/uL (ref 0.0–0.1)
Basophils Relative: 0 %
Eosinophils Absolute: 0.1 10*3/uL (ref 0.0–0.5)
Eosinophils Relative: 1 %
HCT: 38.9 % (ref 36.0–46.0)
Hemoglobin: 12.5 g/dL (ref 12.0–15.0)
Immature Granulocytes: 0 %
Lymphocytes Relative: 22 %
Lymphs Abs: 1.6 10*3/uL (ref 0.7–4.0)
MCH: 27.1 pg (ref 26.0–34.0)
MCHC: 32.1 g/dL (ref 30.0–36.0)
MCV: 84.2 fL (ref 80.0–100.0)
Monocytes Absolute: 0.3 10*3/uL (ref 0.1–1.0)
Monocytes Relative: 4 %
Neutro Abs: 5.3 10*3/uL (ref 1.7–7.7)
Neutrophils Relative %: 73 %
Platelets: 271 10*3/uL (ref 150–400)
RBC: 4.62 MIL/uL (ref 3.87–5.11)
RDW: 13.4 % (ref 11.5–15.5)
WBC: 7.3 10*3/uL (ref 4.0–10.5)
nRBC: 0 % (ref 0.0–0.2)

## 2022-12-28 LAB — COMPREHENSIVE METABOLIC PANEL
ALT: 28 U/L (ref 0–44)
AST: 24 U/L (ref 15–41)
Albumin: 3.8 g/dL (ref 3.5–5.0)
Alkaline Phosphatase: 91 U/L (ref 38–126)
Anion gap: 8 (ref 5–15)
BUN: 16 mg/dL (ref 6–20)
CO2: 23 mmol/L (ref 22–32)
Calcium: 8.5 mg/dL — ABNORMAL LOW (ref 8.9–10.3)
Chloride: 106 mmol/L (ref 98–111)
Creatinine, Ser: 0.83 mg/dL (ref 0.44–1.00)
GFR, Estimated: >60 mL/min (ref >60)
Glucose, Bld: 184 mg/dL — ABNORMAL HIGH (ref 70–99)
Potassium: 3.5 mmol/L (ref 3.5–5.1)
Sodium: 137 mmol/L (ref 135–145)
Total Bilirubin: 0.8 mg/dL (ref 0.3–1.2)
Total Protein: 7.3 g/dL (ref 6.5–8.1)

## 2022-12-28 LAB — SEDIMENTATION RATE: Sed Rate: 20 mm/hr (ref 0–22)

## 2022-12-28 LAB — HM DIABETES EYE EXAM

## 2022-12-28 MED ORDER — GADOBUTROL 1 MMOL/ML IV SOLN
7.0000 mL | Freq: Once | INTRAVENOUS | Status: AC | PRN
  Administered 2022-12-28: 7 mL via INTRAVENOUS

## 2022-12-28 MED ORDER — TETRACAINE HCL 0.5 % OP SOLN
2.0000 [drp] | Freq: Once | OPHTHALMIC | Status: AC
  Administered 2022-12-28: 2 [drp] via OPHTHALMIC
  Filled 2022-12-28: qty 4

## 2022-12-28 MED ORDER — SODIUM CHLORIDE 0.9 % IV SOLN
875.0000 mg | Freq: Once | INTRAVENOUS | Status: DC
  Filled 2022-12-28: qty 14

## 2022-12-28 MED ORDER — FLUORESCEIN SODIUM 1 MG OP STRP
1.0000 | ORAL_STRIP | Freq: Once | OPHTHALMIC | Status: AC
  Administered 2022-12-28: 1 via OPHTHALMIC
  Filled 2022-12-28: qty 1

## 2022-12-28 MED ORDER — METHYLPREDNISOLONE SODIUM SUCC 125 MG IJ SOLR
125.0000 mg | Freq: Once | INTRAMUSCULAR | Status: AC
  Administered 2022-12-28: 125 mg via INTRAVENOUS
  Filled 2022-12-28: qty 2

## 2022-12-28 MED ORDER — KETOROLAC TROMETHAMINE 15 MG/ML IJ SOLN
15.0000 mg | Freq: Once | INTRAMUSCULAR | Status: AC
  Administered 2022-12-28: 15 mg via INTRAVENOUS
  Filled 2022-12-28: qty 1

## 2022-12-28 MED ORDER — SODIUM CHLORIDE 0.9 % IV BOLUS
1000.0000 mL | Freq: Once | INTRAVENOUS | Status: AC
  Administered 2022-12-28: 1000 mL via INTRAVENOUS

## 2022-12-28 NOTE — Telephone Encounter (Signed)
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 They are faxing over office notes at this time

## 2022-12-28 NOTE — ED Provider Notes (Signed)
[place] EMERGENCY DEPARTMENT AT [facility] Provider Note   CSN: [id number] Arrival date & time: 12/28/22  1345     History  Chief Complaint  Patient presents with   Eye Problem    Linda [name] is a 62 y.o. female.   Eye Problem   62 year old female presents emergency department with complaints of visual disturbance in right eye.  Patient states that symptoms present since Monday or Tuesday of this week.  Reports "black cloudy" type object in right visual field in the middle that is present regardless of where she looks.  States she was seen by optometry earlier today with imaging performed of her eye with concern for optic neuritis.  Was then consulted by neurology who recommended follow-up in the emergency department for further imaging/medical therapy.  Patient denies any eye pain and states that her vision is fine besides said visual disturbance.  Denies fever.  Reports she has had some intermittent sharp type sensation behind her right eye that last for a 1 to 2 seconds and has been random in nature over the past week but without persistent eye pain.  Denies any weakness or sensory deficits in upper extremities, slurred speech, facial droop, gait abnormalities.  Denies history of similar symptoms in the past.  Past medical history significant for neuralgic migraine, hypertension, hyperlipidemia, diabetes mellitus, GERD, vertigo  Home Medications Prior to Admission medications   Medication Sig Start Date End Date Taking? Authorizing Provider  amitriptyline (ELAVIL) 10 MG tablet TAKE 2 TABLETS BY MOUTH EVERY DAY AT NIGHT 10/10/22   Van Clines, MD  benzonatate (TESSALON) 100 MG capsule Take 1 capsule (100 mg total) by mouth 3 (three) times daily as needed for cough. 10/10/22   Lonell Grandchild, MD  glucose blood test strip [facility] test strips; UAD to monitor glucose bid;Dx:E11.9 03/26/19   Dianne Dun, MD  ibuprofen (ADVIL) 200 MG tablet Take 200 mg by  mouth every 6 (six) hours as needed. 01/25/16   [provider]  methocarbamol (ROBAXIN) 500 MG tablet Take 1 tablet (500 mg total) by mouth 4 (four) times daily for 7 days. 12/26/22 01/02/23  Amador Cunas, PA-C  ondansetron (ZOFRAN) 4 MG tablet Take 1 tablet (4 mg total) by mouth every 8 (eight) hours as needed for nausea or vomiting. 10/10/22   Lonell Grandchild, MD  OneTouch Delica Lancets 33G MISC UAD to monitor glucose bid;Dx E11.9 03/26/19   Dianne Dun, MD  rosuvastatin (CRESTOR) 20 MG tablet TAKE 1 TABLET BY MOUTH EVERY DAY 11/21/22   [place], Betty G, MD  Semaglutide, 1 MG/DOSE, 4 MG/3ML SOPN Inject 1 mg as directed once a week. 10/03/22   [place], Betty G, MD      Allergies    Morphine, Lisinopril, and Topamax [topiramate]    Review of Systems   Review of Systems  All other systems reviewed and are negative.   Physical Exam Updated Vital Signs BP (!) 157/85 (BP Location: Right Arm)   Pulse 80   Temp 98.2 F (36.8 C) (Oral)   Resp 16   Ht 5' (1.524 m)   Wt 81.6 kg   LMP 08/28/2015 (Approximate)   SpO2 98%   BMI 35.15 kg/m  Physical Exam Vitals and nursing note reviewed.  Constitutional:      General: She is not in acute distress.    Appearance: She is well-developed.  HENT:     Head: Normocephalic and atraumatic.  Eyes:  Intraocular pressure: Right eye pressure is 16 mmHg. Measurements were taken using a handheld tonometer.    Extraocular Movements:     Right eye: Normal extraocular motion and no nystagmus.     Left eye: Normal extraocular motion and no nystagmus.     Conjunctiva/sclera: Conjunctivae normal.     Right eye: Right conjunctiva is not injected. No chemosis, exudate or hemorrhage.    Left eye: Left conjunctiva is not injected. No chemosis, exudate or hemorrhage.    Comments: Patient with fluorescein exam that was largely remarkable of right eye.  No evidence of corneal abrasion, ulceration or foreign body.  Seidel sign negative.   Cardiovascular:     Rate and Rhythm: Normal rate and regular rhythm.     Heart sounds: No murmur heard. Pulmonary:     Effort: Pulmonary effort is normal. No respiratory distress.     Breath sounds: Normal breath sounds.  Abdominal:     Palpations: Abdomen is soft.     Tenderness: There is no abdominal tenderness.  Musculoskeletal:        General: No swelling.     Cervical back: Neck supple.  Skin:    General: Skin is warm and dry.     Capillary Refill: Capillary refill takes less than 2 seconds.  Neurological:     Mental Status: She is alert.     Comments: Alert and oriented to self, place, time and event.   Speech is fluent, clear without dysarthria or dysphasia.   Strength 5/5 in upper/lower extremities   Sensation intact in upper/lower extremities   Normal gait.  Negative Romberg. No pronator drift.  Normal finger-to-nose and feet tapping.  CN I not tested  CN III, IV, VI EOMs intact bilaterally.  Patient without reactive right pupil with evidence of afferent pupillary defect but with reactive left pupil. CN V Intact sensation to sharp and light touch to the face  CN VII facial movements symmetric  CN VIII not tested  CN IX, X no uvula deviation, symmetric rise of soft palate  CN XI 5/5 SCM and trapezius strength bilaterally  CN XII Midline tongue protrusion, symmetric L/R movements     Psychiatric:        Mood and Affect: Mood normal.     ED Results / Procedures / Treatments   Labs (all labs ordered are listed, but only abnormal results are displayed) Labs Reviewed  COMPREHENSIVE METABOLIC PANEL - Abnormal; Notable for the following components:      Result Value   Glucose, Bld 184 (*)    Calcium 8.5 (*)    All other components within normal limits  CBC WITH DIFFERENTIAL/PLATELET  SEDIMENTATION RATE  C-REACTIVE PROTEIN    EKG None  Radiology MR Brain W and Wo Contrast  Result Date: 12/28/2022 CLINICAL DATA:  Optic neuritis suspected EXAM: MRI HEAD  AND ORBITS WITHOUT AND WITH CONTRAST TECHNIQUE: Multiplanar, multiecho pulse sequences of the brain and surrounding structures were obtained without and with intravenous contrast. Multiplanar, multiecho pulse sequences of the orbits and surrounding structures were obtained including fat saturation techniques, before and after intravenous contrast administration. CONTRAST:  7mL GADAVIST GADOBUTROL 1 MMOL/ML IV SOLN COMPARISON:  03/26/2016 MRI head FINDINGS: MRI HEAD FINDINGS Brain: No restricted diffusion to suggest acute or subacute infarct. No abnormal parenchymal or meningeal enhancement. No acute hemorrhage, mass, mass effect, or midline shift. No hydrocephalus or extra-axial collection. Normal pituitary and craniocervical junction. No hemosiderin deposition to suggest remote hemorrhage. Normal cerebral volume for age. T2  hyperintense signal in the periventricular white matter and pons, likely the sequela of mild-to-moderate chronic small vessel ischemic disease. Vascular: Normal arterial flow voids. Normal arterial and venous enhancement. Skull and upper cervical spine: Normal marrow signal. MRI ORBITS FINDINGS Orbits: No traumatic or inflammatory finding. Globes, optic nerves, orbital fat, extraocular muscles, vascular structures, and lacrimal glands are normal. No abnormal enhancement. Visualized sinuses: Clear. Soft tissues: Negative. IMPRESSION: 1. No acute intracranial process. No evidence of optic neuritis. 2. Mild to moderate chronic small vessel ischemic disease. Electronically Signed   By: Wiliam Ke M.D.   On: 12/28/2022 17:19   MR ORBITS W WO CONTRAST  Result Date: 12/28/2022 CLINICAL DATA:  Optic neuritis suspected EXAM: MRI HEAD AND ORBITS WITHOUT AND WITH CONTRAST TECHNIQUE: Multiplanar, multiecho pulse sequences of the brain and surrounding structures were obtained without and with intravenous contrast. Multiplanar, multiecho pulse sequences of the orbits and surrounding structures were  obtained including fat saturation techniques, before and after intravenous contrast administration. CONTRAST:  7mL GADAVIST GADOBUTROL 1 MMOL/ML IV SOLN COMPARISON:  03/26/2016 MRI head FINDINGS: MRI HEAD FINDINGS Brain: No restricted diffusion to suggest acute or subacute infarct. No abnormal parenchymal or meningeal enhancement. No acute hemorrhage, mass, mass effect, or midline shift. No hydrocephalus or extra-axial collection. Normal pituitary and craniocervical junction. No hemosiderin deposition to suggest remote hemorrhage. Normal cerebral volume for age. T2 hyperintense signal in the periventricular white matter and pons, likely the sequela of mild-to-moderate chronic small vessel ischemic disease. Vascular: Normal arterial flow voids. Normal arterial and venous enhancement. Skull and upper cervical spine: Normal marrow signal. MRI ORBITS FINDINGS Orbits: No traumatic or inflammatory finding. Globes, optic nerves, orbital fat, extraocular muscles, vascular structures, and lacrimal glands are normal. No abnormal enhancement. Visualized sinuses: Clear. Soft tissues: Negative. IMPRESSION: 1. No acute intracranial process. No evidence of optic neuritis. 2. Mild to moderate chronic small vessel ischemic disease. Electronically Signed   By: Wiliam Ke M.D.   On: 12/28/2022 17:19    Procedures Procedures    Medications Ordered in ED Medications  tetracaine (PONTOCAINE) 0.5 % ophthalmic solution 2 drop (2 drops Right Eye Given by Other 12/28/22 1700)  fluorescein ophthalmic strip 1 strip (1 strip Left Eye Given by Other 12/28/22 1650)  sodium chloride 0.9 % bolus 1,000 mL (0 mLs Intravenous Stopped 12/28/22 1650)  methylPREDNISolone sodium succinate (SOLU-MEDROL) 125 mg/2 mL injection 125 mg (125 mg Intravenous Given 12/28/22 1514)  ketorolac (TORADOL) 15 MG/ML injection 15 mg (15 mg Intravenous Given 12/28/22 1512)  gadobutrol (GADAVIST) 1 MMOL/ML injection 7 mL (7 mLs Intravenous Contrast Given 12/28/22  1649)    ED Course/ Medical Decision Making/ A&P Clinical Course as of 12/28/22 2210  Fri Dec 28, 2022  1749 Consulted ophthalmology regarding the patient who recommended addition of sed rate and CRP for concern for possible giant cell arteritis as well as consultation with neurology.  If negative follow-up outpatient with eye doctor [CR]  2009 Consult neurology regarding the patient after resulting of sedimentation rate who is in agreement with outpatient follow-up with ophthalmology. [CR]    Clinical Course User Index [CR] Peter Garter, PA                             Medical Decision Making Amount and/or Complexity of Data Reviewed Labs: ordered. Radiology: ordered.  Risk Prescription drug management.   This patient presents to the ED for concern of visual disturbance, this involves an extensive  number of treatment options, and is a complaint that carries with it a high risk of complications and morbidity.  The differential diagnosis includes retinal vein/artery occlusion, giant cell arteritis, CVA, optic neuritis hospital records   Co morbidities that complicate the patient evaluation  See HPI   Additional history obtained:  Additional history obtained from EMR External records from outside source obtained and reviewed including hospital records   Lab Tests:  I Ordered, and personally interpreted labs.  The pertinent results include: Mild hypocalcemia of 8.5 otherwise, electrolytes within normal limits.  No transaminitis.  No renal dysfunction.  No leukocytosis.  No evidence of anemia.  Platelets within range.  Sed rate within normal limits.   Imaging Studies ordered:  I ordered imaging studies including MRI brain and orbits with and without I independently visualized and interpreted imaging which showed no acute intracranial process.  No evidence of optic neuritis.  Mild to moderate chronic small vessel ischemic changes. I agree with the radiologist  interpretation  Cardiac Monitoring: / EKG:  The patient was maintained on a cardiac monitor.  I personally viewed and interpreted the cardiac monitored which showed an underlying rhythm of: Sinus rhythm   Consultations Obtained:  See ED course  Problem List / ED Course / Critical interventions / Medication management  Right visual disturbance I ordered medication including Solu-Medrol, Toradol, tetracaine   Reevaluation of the patient after these medicines showed that the patient improved I have reviewed the patients home medicines and have made adjustments as needed   Social Determinants of Health:  Denies tobacco, illicit drug use   Test / Admission - Considered:  Right visual disturbance Vitals signs significant for mild hypertension with blood pressure 157/85. Otherwise within normal range and stable throughout visit. Laboratory/imaging studies significant for: See above 62 year old female presents emergency department with complaints of cloudy black object in field of vision of right eye since Monday or Tuesday of this week.  Was sent to the emergency department due to concerns for possible optic neuritis.  Patient without any evidence of MRI findings concerning for optic neuritis or other intracranial abnormality.  Doubt CVA.  Sedimentation rate was performed due to some concern for possible giant cell arteritis which was within normal limits.  Consulted ophthalmology regarding the patient who agreed with outpatient management given workup today.  Patient did note improvement of symptoms when leaving the emergency department and feels like it could be related to her abnormal headaches despite not experiencing this particular symptom in the past.  Patient overall well-appearing, afebrile in no acute distress.  Treatment plan discussed length with patient and she acknowledged understanding was agreeable to said plan. Worrisome signs and symptoms were discussed with the patient, and  the patient acknowledged understanding to return to the ED if noticed. Patient was stable upon discharge.          Final Clinical Impression(s) / ED Diagnoses Final diagnoses:  Visual disturbance of one eye    Rx / DC Orders ED Discharge Orders          Ordered    Ambulatory referral to Ophthalmology       Comments: Ophthalmology in [facility]   12/28/22 2007              Peter Garter, [place] 12/28/22 2210    Tegeler, Canary Brim, MD 12/29/22 [phone]

## 2022-12-28 NOTE — Telephone Encounter (Signed)
Pt called back in stating she is struggling today. She doesn't think her migraine medication is working at all. She see's things moving. She would like to see if there is anything that could be done today?

## 2022-12-28 NOTE — Discharge Instructions (Signed)
As discussed, your MRI looked normal.  The inflammatory blood tests were normal.  Recommend follow-up with ophthalmology outpatient.  Attached is information for Dr. Randon Goldsmith to call to set up an appointment.  I have also sent in a referral for ophthalmology.  Please call as soon as you are able to set up an appointment to be seen as soon as possible.  Please do not hesitate to return to emergency department for worrisome signs and symptoms we discussed become apparent.

## 2022-12-28 NOTE — ED Notes (Signed)
Called lab to add sed rate to Lav already sent down

## 2022-12-28 NOTE — ED Triage Notes (Addendum)
Patient was told she has optic neuritis and was told she needed to come in to the ED for a steroid injection through IV and scans. Patient sees a "black blob" in the middle through her right eye.

## 2022-12-28 NOTE — Telephone Encounter (Signed)
Eye doctor notes reviewed, patient has right optic neuritis with continued symptoms. We contacted the infusion center if they could start IV Solumedrol today, however no response. Recommended going to ER for IV Solumedrol (also would recommend MRI brain with and without contrast and MRI orbits with and without contrast).

## 2022-12-28 NOTE — Telephone Encounter (Signed)
Can she come in for a migraine cocktail? If not, we can send a course of prednisone. Is she taking her amitriptyline 10mg  every night? I would recommend taking it regularly at this point

## 2022-12-28 NOTE — Telephone Encounter (Signed)
Pt called she is going to go to the ER for them to start IV solumedrol

## 2022-12-29 LAB — C-REACTIVE PROTEIN: CRP: 0.7 mg/dL (ref <1.0)

## 2022-12-31 ENCOUNTER — Telehealth: Payer: Self-pay

## 2022-12-31 DIAGNOSIS — H4710 Unspecified papilledema: Secondary | ICD-10-CM | POA: Diagnosis not present
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
## 2022-12-31 NOTE — Transitions of Care (Post Inpatient/ED Visit) (Signed)
12/31/2022  Name: Linda [name] MRN: [id number] DOB: [birth year]  Today's TOC FU Call Status: Today's TOC FU Call Status:: Successful TOC FU Call Competed TOC FU Call Complete Date: 12/31/22  Red on EMMI-ED Discharge Alert Date & Reason:12/30/22 "Scheduled follow-up appt? No"  Transition Care Management Follow-up Telephone Call Date of Discharge: 12/28/22 Discharge Facility: [facility]) Type of Discharge: Emergency Department Reason for ED Visit: Other: ('visual disturbance of one eye") How have you been since you were released from the hospital?: Better (Pt states she saw eye MD-Dr. Randon Goldsmith that she was referred to see today and goes back on Thurs. She has been referred to vein/vascular MD as well for furhter workup.) Any questions or concerns?: No  Items Reviewed: Did you receive and understand the discharge instructions provided?: Yes Medications obtained,verified, and reconciled?: Yes (Medications Reviewed) Any new allergies since your discharge?: No Dietary orders reviewed?: NA Do you have support at home?: Yes People in Home: spouse Name of Support/Comfort Primary Source: Leonette Most  Medications Reviewed Today: Medications Reviewed Today     Reviewed by Charlyn Minerva, RN (Registered Nurse) on 12/31/22 at 1400  Med List Status: <None>   Medication Order Taking? Sig Documenting Provider Last Dose Status Informant  amitriptyline (ELAVIL) 10 MG tablet [id number]  TAKE 2 TABLETS BY MOUTH EVERY DAY AT Patricia Nettle, MD  Active   benzonatate (TESSALON) 100 MG capsule [id number] No Take 1 capsule (100 mg total) by mouth 3 (three) times daily as needed for cough. Lonell Grandchild, MD Taking Active   glucose blood test strip [id number] No Onetouch Delica test strips; UAD to monitor glucose bid;Dx:E11.9 Dianne Dun, MD Taking Active   ibuprofen (ADVIL) 200 MG tablet [id number]  Take 200 mg by mouth every 6 (six) hours as needed. [provider]   Active   methocarbamol (ROBAXIN) 500 MG tablet [id number]  Take 1 tablet (500 mg total) by mouth 4 (four) times daily for 7 days. Amador Cunas, PA-C  Active   ondansetron (ZOFRAN) 4 MG tablet [id number] No Take 1 tablet (4 mg total) by mouth every 8 (eight) hours as needed for nausea or vomiting. Lonell Grandchild, MD Taking Active   OneTouch Delica Lancets 33G [place] [id number] No UAD to monitor glucose bid;Dx E11.9 Dianne Dun, MD Taking Active   rosuvastatin (CRESTOR) 20 MG tablet [id number]  TAKE 1 TABLET BY MOUTH EVERY DAY [place], Betty G, MD  Active   Semaglutide, 1 MG/DOSE, 4 MG/3ML SOPN [id number] No Inject 1 mg as directed once a week. [place], Betty G, MD Taking Active             Home Care and Equipment/Supplies: Were Home Health Services Ordered?: NA Any new equipment or medical supplies ordered?: NA  Functional Questionnaire: Do you need assistance with bathing/showering or dressing?: No Do you need assistance with meal preparation?: No Do you need assistance with eating?: No Do you have difficulty maintaining continence: No Do you need assistance with getting out of bed/getting out of a chair/moving?: No Do you have difficulty managing or taking your medications?: No  Follow up appointments reviewed: PCP Follow-up appointment confirmed?: NA Specialist Hospital Follow-up appointment confirmed?: Yes Date of Specialist follow-up appointment?: 12/31/22 Follow-Up Specialty Provider:: Dr. Durel Salts MD, Dr. Clark-01/01/23-vein/vascular MD Do you need transportation to your follow-up appointment?: No Do you understand care options if your condition(s) worsen?: Yes-patient verbalized understanding  SDOH Interventions Today    Flowsheet Row Most Recent Value  SDOH  Interventions   Food Insecurity Interventions Intervention Not Indicated  Transportation Interventions Intervention Not Indicated       TOC Interventions Today    Flowsheet Row Most Recent Value  TOC  Interventions   TOC Interventions Discussed/Reviewed TOC Interventions Discussed      Interventions Today    Flowsheet Row Most Recent Value  General Interventions   General Interventions Discussed/Reviewed General Interventions Discussed, Doctor Visits  Doctor Visits Discussed/Reviewed Doctor Visits Discussed, PCP, Specialist  PCP/Specialist Visits Compliance with follow-up visit  Education Interventions   Education Provided Provided Education  Provided Verbal Education On When to see the doctor, Other  Pharmacy Interventions   Pharmacy Dicussed/Reviewed Pharmacy Topics Discussed  Safety Interventions   Safety Discussed/Reviewed Safety Discussed  [pt confirmed she is not driving until issues with eye have resolved-spouse taking her to appts]       Antionette Fairy, RN,BSN,CCM [facility] Care Management Care Management Community Coordinator Direct Phone: [phone] Toll Free: [phone] Fax: [phone]

## 2023-01-01 ENCOUNTER — Other Ambulatory Visit: Payer: Self-pay

## 2023-01-01 ENCOUNTER — Ambulatory Visit: Payer: BC Managed Care – PPO | Admitting: Vascular Surgery

## 2023-01-01 ENCOUNTER — Encounter (HOSPITAL_COMMUNITY): Payer: Self-pay | Admitting: Vascular Surgery

## 2023-01-01 ENCOUNTER — Encounter: Payer: Self-pay | Admitting: Vascular Surgery

## 2023-01-01 VITALS — BP 158/97 | HR 72 | Temp 97.5°F | Resp 14 | Ht 60.0 in | Wt 186.0 lb

## 2023-01-01 DIAGNOSIS — H539 Unspecified visual disturbance: Secondary | ICD-10-CM

## 2023-01-01 DIAGNOSIS — M2669 Other specified disorders of temporomandibular joint: Secondary | ICD-10-CM | POA: Insufficient documentation

## 2023-01-01 NOTE — Progress Notes (Addendum)
Mrs. Kearney Bajo denies chest pain or shortness of breath. Patient denies having any s/s of Covid in her household, also denies any known exposure to Covid.Mrs. Goldblum denies  any s/s of upper or lower respiratory infection the past 8 weeks.   Mrs. Riley Lam' PCP is Dr. Hughes Better, neurologist is Dr. Patrcia Dolly.  Mrs. Kriebel has type II diabetes. Patient takes Ozempic, patient has not taken Ozempic since 12/24/22- per instructions of Dr. Ophelia Charter office.  I instructed patient to check CBG after awaking and every 2 hours until arrival  to the hospital. I Instructed Mrs. Bernasconi if CBG is less than 70 to take 4 Glucose Tablets or 1 tube of Glucose Gel or 1/2 cup of a clear juice. Recheck CBG in 15 minutes if CBG is not over 70 call, pre- op desk at [phone] for further instructions. If scheduled to receive Insulin, do not take Insulin.

## 2023-01-02 ENCOUNTER — Ambulatory Visit (HOSPITAL_COMMUNITY): Payer: BC Managed Care – PPO | Admitting: Anesthesiology

## 2023-01-02 ENCOUNTER — Other Ambulatory Visit: Payer: Self-pay

## 2023-01-02 ENCOUNTER — Encounter (HOSPITAL_COMMUNITY)
Admission: RE | Disposition: A | Discharge status: Home / Self Care | Payer: Self-pay | Source: Home / Self Care | Attending: Vascular Surgery

## 2023-01-02 ENCOUNTER — Ambulatory Visit (HOSPITAL_COMMUNITY)
Admission: RE | Admit: 2023-01-02 | Discharge: 2023-01-02 | Disposition: A | Discharge status: Home / Self Care | Payer: BC Managed Care – PPO | Attending: Vascular Surgery | Admitting: Vascular Surgery

## 2023-01-02 ENCOUNTER — Encounter (HOSPITAL_COMMUNITY): Payer: Self-pay | Admitting: Vascular Surgery

## 2023-01-02 DIAGNOSIS — H5461 Unqualified visual loss, right eye, normal vision left eye: Secondary | ICD-10-CM | POA: Diagnosis not present

## 2023-01-02 DIAGNOSIS — H539 Unspecified visual disturbance: Secondary | ICD-10-CM | POA: Diagnosis not present

## 2023-01-02 DIAGNOSIS — I739 Peripheral vascular disease, unspecified: Secondary | ICD-10-CM | POA: Insufficient documentation

## 2023-01-02 DIAGNOSIS — E1149 Type 2 diabetes mellitus with other diabetic neurological complication: Secondary | ICD-10-CM | POA: Diagnosis not present

## 2023-01-02 DIAGNOSIS — E119 Type 2 diabetes mellitus without complications: Secondary | ICD-10-CM | POA: Diagnosis not present

## 2023-01-02 DIAGNOSIS — I10 Essential (primary) hypertension: Secondary | ICD-10-CM | POA: Insufficient documentation

## 2023-01-02 DIAGNOSIS — M542 Cervicalgia: Secondary | ICD-10-CM | POA: Insufficient documentation

## 2023-01-02 DIAGNOSIS — G43909 Migraine, unspecified, not intractable, without status migrainosus: Secondary | ICD-10-CM | POA: Diagnosis not present

## 2023-01-02 DIAGNOSIS — K449 Diaphragmatic hernia without obstruction or gangrene: Secondary | ICD-10-CM | POA: Diagnosis not present

## 2023-01-02 DIAGNOSIS — E785 Hyperlipidemia, unspecified: Secondary | ICD-10-CM | POA: Diagnosis not present

## 2023-01-02 HISTORY — DX: Personal history of other diseases of the digestive system: Z87.19

## 2023-01-02 HISTORY — PX: ARTERY BIOPSY: SHX891

## 2023-01-02 LAB — SURGICAL PCR SCREEN
MRSA, PCR: NEGATIVE
Staphylococcus aureus: NEGATIVE

## 2023-01-02 LAB — POCT I-STAT, CHEM 8
BUN: 31 mg/dL — ABNORMAL HIGH (ref 6–20)
Calcium, Ion: 1.1 mmol/L — ABNORMAL LOW (ref 1.15–1.40)
Chloride: 107 mmol/L (ref 98–111)
Creatinine, Ser: 0.8 mg/dL (ref 0.44–1.00)
Glucose, Bld: 109 mg/dL — ABNORMAL HIGH (ref 70–99)
HCT: 36 % (ref 36.0–46.0)
Hemoglobin: 12.2 g/dL (ref 12.0–15.0)
Potassium: 3.8 mmol/L (ref 3.5–5.1)
Sodium: 140 mmol/L (ref 135–145)
TCO2: 23 mmol/L (ref 22–32)

## 2023-01-02 LAB — GLUCOSE, CAPILLARY
Glucose-Capillary: 105 mg/dL — ABNORMAL HIGH (ref 70–99)
Glucose-Capillary: 83 mg/dL (ref 70–99)
Glucose-Capillary: 117 mg/dL — ABNORMAL HIGH (ref 70–99)

## 2023-01-02 SURGERY — BIOPSY TEMPORAL ARTERY
Anesthesia: Monitor Anesthesia Care | Site: Face | Laterality: Right

## 2023-01-02 MED ORDER — LIDOCAINE HCL (PF) 1 % IJ SOLN
INTRAMUSCULAR | Status: AC
  Filled 2023-01-02: qty 30

## 2023-01-02 MED ORDER — ORAL CARE MOUTH RINSE: 15.0000 mL | Freq: Once | OROMUCOSAL | Status: AC

## 2023-01-02 MED ORDER — MIDAZOLAM HCL 2 MG/2ML IJ SOLN
INTRAMUSCULAR | Status: DC | PRN
  Administered 2023-01-02: 2 mg via INTRAVENOUS

## 2023-01-02 MED ORDER — AMISULPRIDE (ANTIEMETIC) 5 MG/2ML IV SOLN: 10.0000 mg | Freq: Once | INTRAVENOUS | Status: DC | PRN

## 2023-01-02 MED ORDER — PHENYLEPHRINE 80 MCG/ML (10ML) SYRINGE FOR IV PUSH (FOR BLOOD PRESSURE SUPPORT)
PREFILLED_SYRINGE | INTRAVENOUS | Status: AC
  Filled 2023-01-02: qty 20

## 2023-01-02 MED ORDER — ONDANSETRON HCL 4 MG/2ML IJ SOLN
INTRAMUSCULAR | Status: AC
  Filled 2023-01-02: qty 4

## 2023-01-02 MED ORDER — PHENYLEPHRINE 80 MCG/ML (10ML) SYRINGE FOR IV PUSH (FOR BLOOD PRESSURE SUPPORT)
PREFILLED_SYRINGE | INTRAVENOUS | Status: DC | PRN
  Administered 2023-01-02 (×3): 80 ug via INTRAVENOUS

## 2023-01-02 MED ORDER — FENTANYL CITRATE (PF) 250 MCG/5ML IJ SOLN
INTRAMUSCULAR | Status: DC | PRN
  Administered 2023-01-02 (×5): 25 ug via INTRAVENOUS

## 2023-01-02 MED ORDER — 0.9 % SODIUM CHLORIDE (POUR BTL) OPTIME
TOPICAL | Status: DC | PRN
  Administered 2023-01-02: 1000 mL

## 2023-01-02 MED ORDER — LACTATED RINGERS IV SOLN: INTRAVENOUS | Status: DC

## 2023-01-02 MED ORDER — CHLORHEXIDINE GLUCONATE 0.12 % MT SOLN
15.0000 mL | Freq: Once | OROMUCOSAL | Status: AC
  Administered 2023-01-02: 15 mL via OROMUCOSAL
  Filled 2023-01-02: qty 15

## 2023-01-02 MED ORDER — HEPARIN 6000 UNIT IRRIGATION SOLUTION
Status: AC
  Filled 2023-01-02: qty 500

## 2023-01-02 MED ORDER — CHLORHEXIDINE GLUCONATE 4 % EX SOLN: 60.0000 mL | Freq: Once | CUTANEOUS | Status: DC

## 2023-01-02 MED ORDER — FENTANYL CITRATE (PF) 250 MCG/5ML IJ SOLN
INTRAMUSCULAR | Status: AC
  Filled 2023-01-02: qty 5

## 2023-01-02 MED ORDER — PROPOFOL 500 MG/50ML IV EMUL
INTRAVENOUS | Status: DC | PRN
  Administered 2023-01-02: 100 ug/kg/min via INTRAVENOUS

## 2023-01-02 MED ORDER — GLYCOPYRROLATE PF 0.2 MG/ML IJ SOSY
PREFILLED_SYRINGE | INTRAMUSCULAR | Status: DC | PRN
  Administered 2023-01-02: .1 mg via INTRAVENOUS

## 2023-01-02 MED ORDER — LIDOCAINE HCL (PF) 1 % IJ SOLN
INTRAMUSCULAR | Status: DC | PRN
  Administered 2023-01-02: 8 mL

## 2023-01-02 MED ORDER — MIDAZOLAM HCL 2 MG/2ML IJ SOLN
INTRAMUSCULAR | Status: AC
  Filled 2023-01-02: qty 2

## 2023-01-02 MED ORDER — CEFAZOLIN SODIUM-DEXTROSE 2-4 GM/100ML-% IV SOLN
2.0000 g | INTRAVENOUS | Status: AC
  Administered 2023-01-02: 2 g via INTRAVENOUS
  Filled 2023-01-02: qty 100

## 2023-01-02 MED ORDER — GLYCOPYRROLATE PF 0.2 MG/ML IJ SOSY
PREFILLED_SYRINGE | INTRAMUSCULAR | Status: AC
  Filled 2023-01-02: qty 2

## 2023-01-02 MED ORDER — STERILE WATER FOR IRRIGATION IR SOLN
Status: DC | PRN
  Administered 2023-01-02: 1000 mL

## 2023-01-02 MED ORDER — ONDANSETRON HCL 4 MG/2ML IJ SOLN
INTRAMUSCULAR | Status: DC | PRN
  Administered 2023-01-02: 4 mg via INTRAVENOUS

## 2023-01-02 MED ORDER — SODIUM CHLORIDE 0.9 % IV SOLN: INTRAVENOUS | Status: DC

## 2023-01-02 MED ORDER — TRAMADOL HCL 50 MG PO TABS: 50.0000 mg | ORAL_TABLET | Freq: Four times a day (QID) | ORAL | 0 refills | Status: DC | PRN

## 2023-01-02 MED ORDER — FENTANYL CITRATE (PF) 100 MCG/2ML IJ SOLN: 25.0000 ug | INTRAMUSCULAR | Status: DC | PRN

## 2023-01-02 SURGICAL SUPPLY — 45 items
ADH SKN CLS APL DERMABOND .7 (GAUZE/BANDAGES/DRESSINGS) ×1
BAG COUNTER SPONGE SURGICOUNT (BAG) ×1 IMPLANT
BAG SPNG CNTER NS LX DISP (BAG) ×1
BALL CTTN LRG ABS STRL LF (GAUZE/BANDAGES/DRESSINGS) ×1
CANISTER SUCT 3000ML PPV (MISCELLANEOUS) ×1 IMPLANT
CLIP TI WIDE RED SMALL 6 (CLIP) ×1 IMPLANT
CNTNR URN SCR LID CUP LEK RST (MISCELLANEOUS) ×1 IMPLANT
CONT SPEC 4OZ STRL OR WHT (MISCELLANEOUS) ×1
COTTONBALL LRG STERILE PKG (GAUZE/BANDAGES/DRESSINGS) ×1 IMPLANT
COVER SURGICAL LIGHT HANDLE (MISCELLANEOUS) ×1 IMPLANT
DERMABOND ADVANCED .7 DNX12 (GAUZE/BANDAGES/DRESSINGS) ×1 IMPLANT
DRAPE OPHTHALMIC 77X100 STRL (CUSTOM PROCEDURE TRAY) ×1 IMPLANT
ELECT NDL BLADE 2-5/6 (NEEDLE) ×1 IMPLANT
ELECT NEEDLE BLADE 2-5/6 (NEEDLE) ×1 IMPLANT
ELECT REM PT RETURN 9FT ADLT (ELECTROSURGICAL) ×1
ELECTRODE REM PT RTRN 9FT ADLT (ELECTROSURGICAL) ×1 IMPLANT
GAUZE 4X4 16PLY ~~LOC~~+RFID DBL (SPONGE) ×1 IMPLANT
GEL ULTRASOUND 8.5O AQUASONIC (MISCELLANEOUS) ×1 IMPLANT
GLOVE BIO SURGEON STRL SZ7.5 (GLOVE) ×1 IMPLANT
GLOVE BIOGEL PI IND STRL 8 (GLOVE) ×1 IMPLANT
GOWN STRL REUS W/ TWL LRG LVL3 (GOWN DISPOSABLE) ×1 IMPLANT
GOWN STRL REUS W/ TWL XL LVL3 (GOWN DISPOSABLE) ×1 IMPLANT
GOWN STRL REUS W/TWL LRG LVL3 (GOWN DISPOSABLE) ×1
GOWN STRL REUS W/TWL XL LVL3 (GOWN DISPOSABLE) ×1
KIT BASIN OR (CUSTOM PROCEDURE TRAY) ×1 IMPLANT
KIT TURNOVER KIT B (KITS) ×1 IMPLANT
LOOP VASCULAR MINI 18 RED (MISCELLANEOUS) ×1
NDL HYPO 25GX1X1/2 BEV (NEEDLE) ×1 IMPLANT
NEEDLE HYPO 25GX1X1/2 BEV (NEEDLE) ×1 IMPLANT
NS IRRIG 1000ML POUR BTL (IV SOLUTION) ×1 IMPLANT
PACK GENERAL/GYN (CUSTOM PROCEDURE TRAY) ×1 IMPLANT
PAD ARMBOARD 7.5X6 YLW CONV (MISCELLANEOUS) ×2 IMPLANT
SPIKE FLUID TRANSFER (MISCELLANEOUS) ×1 IMPLANT
SUCTION FRAZIER HANDLE 10FR (MISCELLANEOUS) ×1
SUCTION TUBE FRAZIER 10FR DISP (MISCELLANEOUS) ×1 IMPLANT
SUT MNCRL AB 4-0 PS2 18 (SUTURE) ×1 IMPLANT
SUT PROLENE 6 0 BV (SUTURE) IMPLANT
SUT SILK 3 0 (SUTURE)
SUT SILK 3-0 18XBRD TIE 12 (SUTURE) IMPLANT
SUT VIC AB 3-0 SH 27 (SUTURE) ×1
SUT VIC AB 3-0 SH 27X BRD (SUTURE) ×1 IMPLANT
SYR CONTROL 10ML LL (SYRINGE) ×1 IMPLANT
TOWEL GREEN STERILE (TOWEL DISPOSABLE) ×1 IMPLANT
VASCULAR TIE MINI RED 18IN STL (MISCELLANEOUS) ×1 IMPLANT
WATER STERILE IRR 1000ML POUR (IV SOLUTION) ×1 IMPLANT

## 2023-01-02 NOTE — Anesthesia Preprocedure Evaluation (Signed)
Anesthesia Evaluation  Patient identified by MRN, date of birth, ID band Patient awake    Reviewed: Allergy & Precautions, NPO status , Patient's Chart, lab work & pertinent test results  Airway Mallampati: II  TM Distance: >3 FB Neck ROM: Full    Dental  (+) Dental Advisory Given   Pulmonary neg pulmonary ROS   breath sounds clear to auscultation       Cardiovascular hypertension, Pt. on medications  Rhythm:Regular Rate:Normal     Neuro/Psych  Headaches  Neuromuscular disease    GI/Hepatic Neg liver ROS, hiatal hernia,GERD  ,,  Endo/Other  diabetes, Type 2    Renal/GU negative Renal ROS     Musculoskeletal   Abdominal   Peds  Hematology negative hematology ROS (+)   Anesthesia Other Findings   Reproductive/Obstetrics                             Anesthesia Physical Anesthesia Plan  ASA: 2  Anesthesia Plan: MAC   Post-op Pain Management: Tylenol PO (pre-op)*   Induction:   PONV Risk Score and Plan: 2 and Propofol infusion, Ondansetron, Treatment may vary due to age or medical condition and Midazolam  Airway Management Planned: Natural Airway, Simple Face Mask and Nasal Cannula  Additional Equipment: None  Intra-op Plan:   Post-operative Plan:   Informed Consent: I have reviewed the patients History and Physical, chart, labs and discussed the procedure including the risks, benefits and alternatives for the proposed anesthesia with the patient or authorized representative who has indicated his/her understanding and acceptance.       Plan Discussed with: CRNA  Anesthesia Plan Comments:        Anesthesia Quick Evaluation
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
## 2023-01-02 NOTE — Transfer of Care (Signed)
Immediate Anesthesia Transfer of Care Note  Patient: Linda [name]  Procedure(s) Performed: RIGHT TEMPORAL ARTERY BIOPSY (Right: Face)  Patient Location: PACU  Anesthesia Type:MAC  Level of Consciousness: awake  Airway & Oxygen Therapy: Patient Spontanous Breathing  Post-op Assessment: Report given to RN and Post -op Vital signs reviewed and stable  Post vital signs: stable  Last Vitals:  Vitals Value Taken Time  BP 109/68 01/02/23 1032  Temp 36.4 C 01/02/23 1032  Pulse 64 01/02/23 1035  Resp 16 01/02/23 1035  SpO2 100 % 01/02/23 1035  Vitals shown include unvalidated device data.  Last Pain:  Vitals:   01/02/23 0725  TempSrc:   PainSc: 0-No pain         Complications: No notable events documented.

## 2023-01-02 NOTE — Discharge Instructions (Signed)
Temporal Artery Biopsy, Care After This sheet gives you information about how to care for yourself after your procedure. Your health care provider may also give you more specific instructions. If you have problems or questions, contact your health care provider. What can I expect after the procedure? After the procedure, it is common to have: Soreness. Bruising. Numbness. Swelling. Follow these instructions at home: Incision care  Follow instructions from your health care provider about how to take care of your incision. Make sure you: Wash your hands with soap and water before and after you change your bandage (dressing). If soap and water are not available, use hand sanitizer. Change your dressing as told by your health care provider. Leave stitches (sutures), skin glue, or adhesive strips in place. These skin closures may need to stay in place for 2 weeks or longer. If adhesive strip edges start to loosen and curl up, you may trim the loose edges. Do not remove adhesive strips completely unless your health care provider tells you to do that. Check your incision area every day for signs of infection. Check for: Redness, swelling, or pain. Fluid or blood. Warmth. Pus or a bad smell. Activity Do not do any physical work or strenuous exercise until your health care provider approves. Do not lift anything that is heavier than 10 lb (4.5 kg), or the limit that you are told, until your health care provider says that it is safe. General instructions  Take over-the-counter and prescription medicines only as told by your health care provider. Do not take aspirin or other NSAIDs unless your health care provider tells you to take them. Aspirin may increase your risk of bleeding at the incision site. Do not drive for 24 hours if you were given a sedative during your procedure. Follow your health care provider's instructions on bathing or showering. Keep all follow-up visits as told by your health  care provider. This is important. Contact a health care provider if: Medicine does not help your pain. You have chills. You have redness, swelling, or pain around your incision site. You have fluid or blood coming from your incision site. Your incision feels warm to the touch. You have pus or a bad smell coming from your incision site. You have a fever. You have nausea or vomiting. Get help right away if: You have bleeding from the incision that does not stop after 30 minutes of applying heavy pressure. You have chest pain. You have shortness of breath. You faint. You have sudden vision loss. You have weakness or drooping in your face or eye. Summary Follow instructions from your health care provider about how to take care of your incision. Do not do any physical work or strenuous exercise until your health care provider approves. Take over-the-counter and prescription medicines only as told by your health care provider. Contact a health care provider if you have signs of infection at your incision. Keep all follow-up visits as told by your health care provider. This is important. This information is not intended to replace advice given to you by your health care provider. Make sure you discuss any questions you have with your health care provider.

## 2023-01-02 NOTE — Anesthesia Postprocedure Evaluation (Signed)
Anesthesia Post Note  Patient: Linda [name]  Procedure(s) Performed: RIGHT TEMPORAL ARTERY BIOPSY (Right: Face)     Patient location during evaluation: PACU Anesthesia Type: MAC Level of consciousness: awake and alert Pain management: pain level controlled Vital Signs Assessment: post-procedure vital signs reviewed and stable Respiratory status: spontaneous breathing Cardiovascular status: stable Anesthetic complications: no   No notable events documented.  Last Vitals:  Vitals:   01/02/23 1045 01/02/23 1100  BP: 119/69 129/75  Pulse: 61 (!) 59  Resp: 15 17  Temp:  36.5 C  SpO2: 100% 100%    Last Pain:  Vitals:   01/02/23 1032  TempSrc:   PainSc: 0-No pain                 Lewie Loron

## 2023-01-02 NOTE — Op Note (Signed)
Date: Jan 02, 2023  Preoperative diagnosis: Concern for temporal arteritis  Postoperative diagnosis: Same  Procedure: Right temporal artery biopsy  Surgeon: Dr. Cephus Shelling, MD  Assistant: OR staff  Indications: 62 year old female seen in the office yesterday at the request of ophthalmology for evaluation of right temporal artery biopsy.  She developed right eye vision loss with jaw claudication.  She presents today for right temporal artery biopsy after risks benefits discussed.  Findings: Right temporal artery was marked out with Doppler.  Incision was made along the hairline.  Approximately 2.5 cm specimen of temporal artery was biopsied and sent to pathology.  Anesthesia: MAC with local  Details: Patient was taken to the operating room after informed consent was obtained.  Placed on operative table in the supine position.  After anesthesia was induced ,I used Doppler to mark out the course of the temporal artery are all along her hairline over the right temporal region.  I did have to shave the edge of her hairline.  This region was then prepped and draped in standard sterile fashion.  I injected 8 mL of 1% lidocaine without epinephrine.  I then made incision with 15 blade scalpel and dissected down with Bovie cautery.  I used spring retractors.  The temporal artery was identified and dissected out with Metzenbaum scissors.  The branches were controlled with clips.  Once I had a long enough specimen this was ligated over right angle clamps on each end and transected.  The specimen was passed off the field and sent to pathology.  Each end was ligated with 4-0 silk suture.  I irrigated the incision and it was closed with 3-0 Vicryl 4-0 Monocryl and Dermabond.  Complication: None  Condition: Stable  Cephus Shelling, MD [facility] [place] Office: [phone]   Cephus Shelling

## 2023-01-03 ENCOUNTER — Encounter (HOSPITAL_COMMUNITY): Payer: Self-pay | Admitting: Vascular Surgery

## 2023-01-03 DIAGNOSIS — H4710 Unspecified papilledema: Secondary | ICD-10-CM | POA: Diagnosis not present

## 2023-01-03 LAB — SURGICAL PATHOLOGY

## 2023-01-17 DIAGNOSIS — H4710 Unspecified papilledema: Secondary | ICD-10-CM | POA: Diagnosis not present

## 2023-02-01 DIAGNOSIS — M316 Other giant cell arteritis: Secondary | ICD-10-CM | POA: Diagnosis not present

## 2023-02-01 DIAGNOSIS — R8290 Unspecified abnormal findings in urine: Secondary | ICD-10-CM | POA: Diagnosis not present

## 2023-02-01 DIAGNOSIS — R5383 Other fatigue: Secondary | ICD-10-CM | POA: Diagnosis not present

## 2023-02-01 DIAGNOSIS — R6884 Jaw pain: Secondary | ICD-10-CM | POA: Diagnosis not present

## 2023-02-01 DIAGNOSIS — Z79899 Other long term (current) drug therapy: Secondary | ICD-10-CM | POA: Diagnosis not present

## 2023-02-01 DIAGNOSIS — R519 Headache, unspecified: Secondary | ICD-10-CM | POA: Diagnosis not present

## 2023-02-01 DIAGNOSIS — H5461 Unqualified visual loss, right eye, normal vision left eye: Secondary | ICD-10-CM | POA: Diagnosis not present

## 2023-02-07 DIAGNOSIS — H4710 Unspecified papilledema: Secondary | ICD-10-CM | POA: Diagnosis not present

## 2023-02-20 ENCOUNTER — Telehealth: Payer: Self-pay | Admitting: Family Medicine

## 2023-02-20 ENCOUNTER — Encounter: Payer: Self-pay | Admitting: Internal Medicine

## 2023-02-20 ENCOUNTER — Other Ambulatory Visit: Payer: Self-pay | Admitting: Internal Medicine

## 2023-02-20 ENCOUNTER — Encounter (INDEPENDENT_AMBULATORY_CARE_PROVIDER_SITE_OTHER): Payer: BC Managed Care – PPO | Admitting: Family Medicine

## 2023-02-20 ENCOUNTER — Ambulatory Visit (INDEPENDENT_AMBULATORY_CARE_PROVIDER_SITE_OTHER): Payer: BC Managed Care – PPO | Admitting: Internal Medicine

## 2023-02-20 VITALS — BP 120/88 | HR 90 | Temp 98.1°F | Wt 183.0 lb

## 2023-02-20 DIAGNOSIS — E1169 Type 2 diabetes mellitus with other specified complication: Secondary | ICD-10-CM | POA: Diagnosis not present

## 2023-02-20 DIAGNOSIS — E1165 Type 2 diabetes mellitus with hyperglycemia: Secondary | ICD-10-CM

## 2023-02-20 DIAGNOSIS — M316 Other giant cell arteritis: Secondary | ICD-10-CM

## 2023-02-20 DIAGNOSIS — Z794 Long term (current) use of insulin: Secondary | ICD-10-CM | POA: Diagnosis not present

## 2023-02-20 LAB — POCT GLYCOSYLATED HEMOGLOBIN (HGB A1C): Hemoglobin A1C: 9.4 % — AB (ref 4.0–5.6)

## 2023-02-20 MED ORDER — INSULIN GLARGINE-YFGN 100 UNIT/ML ~~LOC~~ SOLN
5.0000 [IU] | Freq: Every day | SUBCUTANEOUS | 11 refills | Status: DC
Start: 2023-02-20 — End: 2023-03-22

## 2023-02-20 MED ORDER — DEXCOM G7 RECEIVER DEVI: 1.0000 | Freq: Every day | 2 refills | Status: DC

## 2023-02-20 MED ORDER — DEXCOM G7 SENSOR MISC: 1.0000 | Freq: Every day | 3 refills | Status: DC

## 2023-02-20 MED ORDER — FREESTYLE LIBRE 3 SENSOR MISC
1 refills | Status: DC
Start: 2023-02-20 — End: 2023-02-20

## 2023-02-20 MED ORDER — TOUJEO SOLOSTAR 300 UNIT/ML ~~LOC~~ SOPN
5.0000 [IU] | PEN_INJECTOR | Freq: Every day | SUBCUTANEOUS | 0 refills | Status: DC
Start: 2023-02-20 — End: 2023-02-20
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
## 2023-02-20 NOTE — Telephone Encounter (Signed)
Pt call back and stated she take with her Insurance co and the pharmacy and they said that the Insurance will cover Dexcom sensor or Semglee but dr will have to call in a new RX.

## 2023-02-20 NOTE — Patient Instructions (Addendum)
-  Nice seeing you today!!  -Start toujeo 5 units at bedtime.  -Schedule follow up with Dr. [place] in 2 weeks to go over your glucose readings.

## 2023-02-24 ENCOUNTER — Encounter: Payer: Self-pay | Admitting: Family Medicine

## 2023-02-24 DIAGNOSIS — E1169 Type 2 diabetes mellitus with other specified complication: Secondary | ICD-10-CM

## 2023-02-26 ENCOUNTER — Telehealth: Payer: BC Managed Care – PPO | Admitting: Family Medicine

## 2023-02-26 ENCOUNTER — Ambulatory Visit: Payer: BC Managed Care – PPO | Admitting: Family Medicine

## 2023-02-26 ENCOUNTER — Other Ambulatory Visit (HOSPITAL_COMMUNITY): Payer: Self-pay

## 2023-02-27 DIAGNOSIS — Z124 Encounter for screening for malignant neoplasm of cervix: Secondary | ICD-10-CM | POA: Diagnosis not present

## 2023-02-27 DIAGNOSIS — Z01419 Encounter for gynecological examination (general) (routine) without abnormal findings: Secondary | ICD-10-CM | POA: Diagnosis not present

## 2023-02-27 DIAGNOSIS — Z6836 Body mass index (BMI) 36.0-36.9, adult: Secondary | ICD-10-CM | POA: Diagnosis not present

## 2023-02-27 NOTE — Progress Notes (Signed)
HPI: Ms.Hailyn H Tomayko is a 62 y.o. female, who is here today for follow up. I last saw her on 2/211/24, since then she had a few new health problems.  Evaluated in the ED on 12/28/22 for right eye visual changes, which was preceded by a day of bi temporal headache and jaw claudication like symptoms. Dx'ed with temporal arteritis, she was started on Prednisone 60 mg, it has been tapered down and currently on 30 mg daily. She is following with neurologist, rheumatologist,and ophthalmologist. Underwent temporal artery bx on 01/02/23, when Prednisone was already started. Right eye visual disturbances have improved and according to pt, so has her optic neuritis but has not resolved. Headache has resolved.  Brain MRI and orbit MRI on 12/28/22: 1. No acute intracranial process. No evidence of optic neuritis. 2. Mild to moderate chronic small vessel ischemic disease. Lab Results  Component Value Date   CRP 0.7 12/28/2022   Lab Results  Component Value Date   ESRSEDRATE 20 12/28/2022   Lab Results  Component Value Date   NA 140 01/02/2023   CL 107 01/02/2023   K 3.8 01/02/2023   CO2 23 12/28/2022   BUN 31 (H) 01/02/2023   CREATININE 0.80 01/02/2023   GFRNONAA >60 12/28/2022   CALCIUM 8.5 (L) 12/28/2022   ALBUMIN 3.8 12/28/2022   GLUCOSE 109 (H) 01/02/2023   Lab Results  Component Value Date   WBC 7.3 12/28/2022   HGB 12.2 01/02/2023   HCT 36.0 01/02/2023   MCV 84.2 12/28/2022   PLT 271 12/28/2022   Diabetes Mellitus II: Diagnosed in 09/2011. Since on Prednisone her BS's have been elevated. Since her last visit she was started on semglee 5 U and has been titrating up, she has been on 20 U daily for the past 5 days and today postprandial glucose 400's, so took 5 U. Currently she is on Ozempic 1 mg weekly, which she has tolerated well with no side effects. Negative for symptoms of hypoglycemia, polyuria, polydipsia, numbness extremities, foot ulcers/trauma  Lab Results   Component Value Date   HGBA1C 9.4 (A) 02/20/2023   Lab Results  Component Value Date   MICROALBUR 1.0 10/04/2022   Review of Systems  Constitutional:  Positive for fatigue. Negative for activity change, appetite change, chills and fever.  HENT:  Negative for mouth sores, nosebleeds, sore throat and trouble swallowing.   Eyes:  Negative for discharge and redness.  Respiratory:  Negative for cough, shortness of breath and wheezing.   Cardiovascular:  Negative for chest pain, palpitations and leg swelling.  Gastrointestinal:  Negative for abdominal pain, nausea and vomiting.       Negative for changes in bowel habits.  Endocrine: Negative for cold intolerance and heat intolerance.  Genitourinary:  Negative for decreased urine volume, difficulty urinating, dysuria and hematuria.  Neurological:  Negative for seizures, syncope, facial asymmetry and weakness.  See other pertinent positives and negatives in HPI.  Current Outpatient Medications on File Prior to Visit  Medication Sig Dispense Refill   ACTEMRA ACTPEN 162 MG/0.9ML SOAJ Inject into the skin.     amitriptyline (ELAVIL) 10 MG tablet TAKE 2 TABLETS BY MOUTH EVERY DAY AT NIGHT (Patient taking differently: Take 10-20 mg by mouth at bedtime as needed for sleep.) 180 tablet 3   Cal Carb-Mag Hydrox-Simeth (MYLANTA COAT & COOL) 1200-270-80 MG/10ML SUSP Take 15 mLs by mouth daily as needed (coat stomach).     Continuous Glucose Receiver (DEXCOM G7 RECEIVER) DEVI USE AS DIRECTED 1 each  2   Continuous Glucose Sensor (DEXCOM G7 SENSOR) MISC USE AS DIRECTED 2 each 3   glucose blood test strip Onetouch Delica test strips; UAD to monitor glucose bid;Dx:E11.9 100 each 12   insulin glargine-yfgn (SEMGLEE, YFGN,) 100 UNIT/ML injection Inject 0.05 mLs (5 Units total) into the skin at bedtime. 1 mL 11   OneTouch Delica Lancets 33G MISC UAD to monitor glucose bid;Dx E11.9 100 each 12   predniSONE (DELTASONE) 10 MG tablet Take 60 mg by mouth daily with  breakfast.     rosuvastatin (CRESTOR) 20 MG tablet TAKE 1 TABLET BY MOUTH EVERY DAY 90 tablet 2   Semaglutide, 1 MG/DOSE, 4 MG/3ML SOPN Inject 1 mg as directed once a week. 9 mL 1   No current facility-administered medications on file prior to visit.    Past Medical History:  Diagnosis Date   Allergic rhinitis    Allergy    SEASONAL- pt states she has not had issues in "years"   Anemia    Cardiac murmur    1/6 SEM at [facility] (old finding per patient)- no heart murmur per Dr. Dayton Martes   Carpal tunnel syndrome 2023   Cubital tunnel syndrome 2023   Diabetes mellitus without complication (HCC)    GERD (gastroesophageal reflux disease)    History of hiatal hernia    Hyperlipidemia    Hypertension    Neuralgic migraines    "silent migraines"   Obesity    Vertigo    Allergies  Allergen Reactions   Morphine Itching    Hydrocodone is OK   Lisinopril Swelling    Numbness and swelling of lips    Topamax [Topiramate] Rash    Social History   Socioeconomic History   Marital status: Married    Spouse name: Charles   Number of children: 0   Years of education: Masters   Highest education level: Not on file  Occupational History   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: [employer]  Tobacco Use   Smoking status: Never   Smokeless tobacco: Never  Vaping Use   Vaping status: Never Used  Substance and Sexual Activity   Alcohol use: Yes    Comment: maybe 1 a month   Drug use: No   Sexual activity: Yes    Birth control/protection: Post-menopausal  Other Topics Concern   Not on file  Social History Narrative   12/07/19   From: the area   Living: with husband, Hydrologist   Work: Teacher, English as a foreign language at [employer]   Education: [employer] grad, [employer]      Family: Step son - Zanab Wetsel       Enjoys: sing - band shows      Exercise: walking on the treadmill    Diet: not doing great with this, trying to cut back on carb intake      Safety   Seat belts: Yes    Guns:  Yes  and secure   Safe in relationships: Yes     left handed    Social Determinants of Health   Financial Resource Strain: Not on file  Food Insecurity: No Food Insecurity (12/31/2022)   Hunger Vital Sign    Worried About Running Out of Food in the Last Year: Never true    Ran Out of Food in the Last Year: Never true  Transportation Needs: No Transportation Needs (12/31/2022)   PRAPARE - Transportation    Lack of Transportation (Medical): No    Lack of  Transportation (Non-Medical): No  Physical Activity: Not on file  Stress: Not on file  Social Connections: Not on file    Vitals:   03/01/23 1229  BP: 120/70  Pulse: 94  Resp: 16  SpO2: 97%   Body mass index is 36.98 kg/m.  Physical Exam Vitals and nursing note reviewed.  Constitutional:      General: She is not in acute distress.    Appearance: She is well-developed.  HENT:     Head: Normocephalic and atraumatic.     Mouth/Throat:     Mouth: Mucous membranes are moist.     Pharynx: Oropharynx is clear.  Eyes:     Conjunctiva/sclera: Conjunctivae normal.  Cardiovascular:     Rate and Rhythm: Normal rate and regular rhythm.     Pulses:          Dorsalis pedis pulses are 2+ on the right side and 2+ on the left side.     Heart sounds: No murmur heard. Pulmonary:     Effort: Pulmonary effort is normal. No respiratory distress.     Breath sounds: Normal breath sounds.  Abdominal:     Palpations: Abdomen is soft. There is no hepatomegaly or mass.     Tenderness: There is no abdominal tenderness.  Lymphadenopathy:     Cervical: No cervical adenopathy.  Skin:    General: Skin is warm.     Findings: No erythema or rash.  Neurological:     General: No focal deficit present.     Mental Status: She is alert and oriented to person, place, and time.     Cranial Nerves: No cranial nerve deficit.     Gait: Gait normal.  Psychiatric:        Mood and Affect: Mood and affect normal.   ASSESSMENT AND PLAN:  Ms. Quinesha was  seen today for follow-up.  Diagnoses and all orders for this visit:  Type 2 diabetes mellitus with other specified complication, without long-term current use of insulin (HCC) Assessment & Plan: Because on Prednisone her BS's have been elevated, up to 400's post prandial. Last HgA1C was 9.4 on 02/20/23. Semglee increased from 20-25 U to 30 U and Metformin 500 mg bid with meals added. Continue Ozempic 1 mg weekly. Continue monitoring BS's regularly, will continue adjusting basal insulin weekly according to BS's. F/U in 3 months.,  Orders: -     metFORMIN HCl; Take 1 tablet (500 mg total) by mouth 2 (two) times daily with a meal.  Dispense: 180 tablet; Refill: 2  Visual disturbance Right optic neuritis, she has been treated for temporal arteritis. Currently on prednisone 30 mg daily and planning on continuing decreasing dose as tolerated. Also on Actemra  Following with neuro,rheumatologist,and ophthalmologist.  Return in about 14 weeks (around 06/07/2023) for chronic problems.  Blaize Nipper G. [place], MD  [facility]. [place] office.

## 2023-02-28 DIAGNOSIS — M316 Other giant cell arteritis: Secondary | ICD-10-CM | POA: Diagnosis not present

## 2023-02-28 DIAGNOSIS — H5461 Unqualified visual loss, right eye, normal vision left eye: Secondary | ICD-10-CM | POA: Diagnosis not present

## 2023-02-28 DIAGNOSIS — R6884 Jaw pain: Secondary | ICD-10-CM | POA: Diagnosis not present

## 2023-02-28 DIAGNOSIS — R519 Headache, unspecified: Secondary | ICD-10-CM | POA: Diagnosis not present

## 2023-03-01 ENCOUNTER — Ambulatory Visit (INDEPENDENT_AMBULATORY_CARE_PROVIDER_SITE_OTHER): Payer: BC Managed Care – PPO | Admitting: Family Medicine

## 2023-03-01 ENCOUNTER — Encounter: Payer: Self-pay | Admitting: Family Medicine

## 2023-03-01 VITALS — BP 120/70 | HR 94 | Resp 16 | Ht 60.0 in | Wt 189.4 lb

## 2023-03-01 DIAGNOSIS — H539 Unspecified visual disturbance: Secondary | ICD-10-CM | POA: Diagnosis not present

## 2023-03-01 DIAGNOSIS — E1169 Type 2 diabetes mellitus with other specified complication: Secondary | ICD-10-CM | POA: Diagnosis not present

## 2023-03-01 DIAGNOSIS — Z7984 Long term (current) use of oral hypoglycemic drugs: Secondary | ICD-10-CM | POA: Diagnosis not present

## 2023-03-01 MED ORDER — METFORMIN HCL 500 MG PO TABS: 500.0000 mg | ORAL_TABLET | Freq: Two times a day (BID) | ORAL | 2 refills | Status: DC

## 2023-03-01 MED ORDER — METFORMIN HCL 500 MG PO TABS
500.0000 mg | ORAL_TABLET | Freq: Two times a day (BID) | ORAL | 2 refills | Status: DC
Start: 2023-03-01 — End: 2023-03-01

## 2023-03-01 NOTE — Patient Instructions (Signed)
A few things to remember from today's visit:  Type 2 diabetes mellitus with other specified complication, without long-term current use of insulin (HCC) - Plan: metFORMIN (GLUCOPHAGE) 500 MG tablet  Increase Semglee to 30 U daily. Metformin 500 mg with meals. No changes in ozempic. Continue monitoring blood sugars.  If you need refills for medications you take chronically, please call your pharmacy. Do not use My Chart to request refills or for acute issues that need immediate attention. If you send a my chart message, it may take a few days to be addressed, specially if I am not in the office.  Please be sure medication list is accurate. If a new problem present, please set up appointment sooner than planned today.

## 2023-03-01 NOTE — Assessment & Plan Note (Addendum)
Because on Prednisone her BS's have been elevated, up to 400's post prandial. Last HgA1C was 9.4 on 02/20/23. Semglee increased from 20-25 U to 30 U and Metformin 500 mg bid with meals added. Continue Ozempic 1 mg weekly. Continue monitoring BS's regularly, will continue adjusting basal insulin weekly according to BS's. F/U in 3 months.,

## 2023-03-01 NOTE — Telephone Encounter (Signed)
Seen today in office

## 2023-03-06 ENCOUNTER — Ambulatory Visit: Payer: BC Managed Care – PPO | Admitting: Family Medicine

## 2023-03-06 MED ORDER — "INSULIN SYRINGE-NEEDLE U-100 31G X 5/16"" 0.3 ML MISC": 3 refills | Status: DC

## 2023-03-06 MED ORDER — "INSULIN SYRINGE-NEEDLE U-100 31G X 5/16"" 0.3 ML MISC": 3 refills | Status: AC

## 2023-03-06 NOTE — Addendum Note (Signed)
Addended by: Kathreen Devoid on: 03/06/2023 07:02 AM   Modules accepted: Orders

## 2023-03-06 NOTE — Addendum Note (Signed)
Addended by: Kathreen Devoid on: 03/06/2023 06:52 AM   Modules accepted: Orders

## 2023-03-12 DIAGNOSIS — M316 Other giant cell arteritis: Secondary | ICD-10-CM | POA: Diagnosis not present

## 2023-03-22 ENCOUNTER — Other Ambulatory Visit: Payer: Self-pay | Admitting: Family Medicine

## 2023-03-22 DIAGNOSIS — E1165 Type 2 diabetes mellitus with hyperglycemia: Secondary | ICD-10-CM

## 2023-03-22 MED ORDER — INSULIN GLARGINE-YFGN 100 UNIT/ML ~~LOC~~ SOLN
30.0000 [IU] | Freq: Every day | SUBCUTANEOUS | 3 refills | Status: DC
Start: 2023-03-22 — End: 2023-03-26

## 2023-03-22 MED ORDER — INSULIN GLARGINE-YFGN 100 UNIT/ML ~~LOC~~ SOLN
30.0000 [IU] | Freq: Every day | SUBCUTANEOUS | 3 refills | Status: DC
Start: 2023-03-22 — End: 2023-03-22

## 2023-03-22 MED ORDER — INSULIN GLARGINE-YFGN 100 UNIT/ML ~~LOC~~ SOLN
25.0000 [IU] | Freq: Every day | SUBCUTANEOUS | 2 refills | Status: DC
Start: 2023-03-22 — End: 2023-03-22

## 2023-03-22 NOTE — Addendum Note (Signed)
Addended by: [place], Alonza Knisley G on: 03/22/2023 05:04 PM   Modules accepted: Orders

## 2023-03-24 ENCOUNTER — Other Ambulatory Visit: Payer: Self-pay | Admitting: Family Medicine

## 2023-03-24 DIAGNOSIS — E1169 Type 2 diabetes mellitus with other specified complication: Secondary | ICD-10-CM

## 2023-03-26 MED ORDER — PEN NEEDLES 32G X 4 MM MISC: 2 refills | Status: AC

## 2023-03-26 MED ORDER — METFORMIN HCL 500 MG PO TABS
500.0000 mg | ORAL_TABLET | Freq: Two times a day (BID) | ORAL | 2 refills | Status: DC
Start: 2023-03-26 — End: 2023-10-04

## 2023-03-26 MED ORDER — INSULIN GLARGINE-YFGN 100 UNIT/ML ~~LOC~~ SOLN
30.0000 [IU] | Freq: Every day | SUBCUTANEOUS | 3 refills | Status: DC
Start: 2023-03-26 — End: 2023-06-03

## 2023-03-26 NOTE — Addendum Note (Signed)
Addended by: Kathreen Devoid on: 03/26/2023 07:46 AM   Modules accepted: Orders

## 2023-03-26 NOTE — Addendum Note (Signed)
Addended by: Kathreen Devoid on: 03/26/2023 07:44 AM   Modules accepted: Orders

## 2023-03-29 MED ORDER — SEMAGLUTIDE (1 MG/DOSE) 4 MG/3ML ~~LOC~~ SOPN
1.0000 mg | PEN_INJECTOR | SUBCUTANEOUS | 1 refills | Status: DC
Start: 2023-03-29 — End: 2023-09-13

## 2023-03-29 NOTE — Addendum Note (Signed)
Addended by: Kathreen Devoid on: 03/29/2023 07:15 AM   Modules accepted: Orders

## 2023-04-03 ENCOUNTER — Telehealth: Payer: Self-pay

## 2023-04-03 MED ORDER — FREESTYLE LIBRE 3 READER DEVI: 0 refills | Status: AC

## 2023-04-03 MED ORDER — FREESTYLE LIBRE 3 SENSOR MISC: 11 refills | Status: DC

## 2023-04-03 NOTE — Telephone Encounter (Signed)
Patient sent mychart message wanting to switch from [facility] to Freestyle. PA's have been started for both the Freestyle reader and sensor via covermymeds. Keys: [id number] & [id number].  Rx's sent into pharmacy.

## 2023-04-04 ENCOUNTER — Telehealth: Payer: Self-pay | Admitting: Pharmacy Technician

## 2023-04-04 ENCOUNTER — Other Ambulatory Visit (HOSPITAL_COMMUNITY): Payer: Self-pay

## 2023-04-04 NOTE — Telephone Encounter (Signed)
Pharmacy Patient Advocate Encounter  Received notification from [facility] that Prior Authorization for [facility] has been APPROVED from 04/03/23 to 04/02/24. Ran test claim, Copay is $0.00. This test claim was processed through [facility]- copay amounts may vary at other pharmacies due to pharmacy/plan contracts, or as the patient moves through the different stages of their insurance plan.   PA #/Case ID/Reference #: [id number]

## 2023-04-10 NOTE — Progress Notes (Unsigned)
ACUTE VISIT Chief Complaint  Patient presents with   Ear Pain    Started 3 days ago.   Urinary Tract Infection    Slight burning for a few days   HPI: [name] is a 62 y.o. female with PMHx significant for DM 2, hyperlipidemia, vertiginous migraines, seasonal allergies, and hypertension here today complaining of right earache as described above. Intermittent sharp pains in right ear. She has not identified exacerbating or alleviating factors.  Otalgia  There is pain in the right ear. The current episode started 1 to 4 weeks ago. The problem has been waxing and waning. There has been no fever. The pain is severe. Pertinent negatives include no abdominal pain, coughing, diarrhea, ear discharge, headaches, hearing loss, neck pain, rash, rhinorrhea, sore throat or vomiting. She has tried ear drops for the symptoms. The treatment provided mild relief. There is no history of hearing loss.   She denies any changes in hearing, drainage from the ear, pain upon touching the ear, ringing in the ear, recent respiratory infections, or travel. She also denies any nasal congestion, runny nose, or unusual headaches.  In addition to the ear pain, she reports urinary symptoms, dysuria and urgency, which started at the same time as the ear pain. She denies increased urinary frequency, incontinence, gross hematuria, vaginal bleeding, discharge, or pelvic pain.  She is sexually active. No history of pyelonephritis.   She has taken Azo, which has helped.  Increase frequency in bowel movements, no significant changes in consistency since starting Actemra for temporal arteritis.  There is not blood or dyschezia. She is still on prednisone but it is being gradually decreased, currently taking 12.5 mg daily.  Review of Systems  Constitutional:  Positive for fatigue. Negative for activity change, appetite change and fever.  HENT:  Positive for ear pain. Negative for ear discharge, hearing loss,  rhinorrhea and sore throat.   Respiratory:  Negative for cough, shortness of breath and wheezing.   Cardiovascular:  Negative for chest pain.  Gastrointestinal:  Negative for abdominal pain, diarrhea and vomiting.  Musculoskeletal:  Negative for neck pain.  Skin:  Negative for rash.  Neurological:  Negative for syncope, weakness and headaches.  Psychiatric/Behavioral:  Negative for confusion. The patient is nervous/anxious.   See other pertinent positives and negatives in HPI.  Current Outpatient Medications on File Prior to Visit  Medication Sig Dispense Refill   ACTEMRA ACTPEN 162 MG/0.9ML SOAJ Inject into the skin.     amitriptyline (ELAVIL) 10 MG tablet TAKE 2 TABLETS BY MOUTH EVERY DAY AT NIGHT (Patient taking differently: Take 10-20 mg by mouth at bedtime as needed for sleep.) 180 tablet 3   Cal Carb-Mag Hydrox-Simeth (MYLANTA COAT & COOL) 1200-270-80 MG/10ML SUSP Take 15 mLs by mouth daily as needed (coat stomach).     Continuous Glucose Receiver (FREESTYLE LIBRE 3 READER) DEVI Use to check blood sugars continuously. 1 each 0   Continuous Glucose Sensor (FREESTYLE LIBRE 3 SENSOR) MISC Use to check blood sugars continuously. 2 each 11   glucose blood test strip Onetouch Delica test strips; UAD to monitor glucose bid;Dx:E11.9 100 each 12   insulin glargine-yfgn (SEMGLEE, YFGN,) 100 UNIT/ML injection Inject 0.3 mLs (30 Units total) into the skin at bedtime. 9 mL 3   Insulin Pen Needle (PEN NEEDLES) 32G X 4 MM MISC To use with insulin 100 each 2   Insulin Syringe-Needle U-100 (GNP INSULIN SYRINGES 31GX5/16") 31G X 5/16" 0.3 ML MISC To use with insulin 100  each 3   metFORMIN (GLUCOPHAGE) 500 MG tablet Take 1 tablet (500 mg total) by mouth 2 (two) times daily with a meal. 180 tablet 2   OneTouch Delica Lancets 33G MISC UAD to monitor glucose bid;Dx E11.9 100 each 12   predniSONE (DELTASONE) 10 MG tablet Take 60 mg by mouth daily with breakfast.     rosuvastatin (CRESTOR) 20 MG tablet TAKE 1  TABLET BY MOUTH EVERY DAY 90 tablet 2   Semaglutide, 1 MG/DOSE, 4 MG/3ML SOPN Inject 1 mg as directed once a week. 9 mL 1   No current facility-administered medications on file prior to visit.    Past Medical History:  Diagnosis Date   Allergic rhinitis    Allergy    SEASONAL- pt states she has not had issues in "years"   Anemia    Cardiac murmur    1/6 SEM at [facility] (old finding per patient)- no heart murmur per Dr. Dayton Martes   Carpal tunnel syndrome 2023   Cubital tunnel syndrome 2023   Diabetes mellitus without complication (HCC)    GERD (gastroesophageal reflux disease)    History of hiatal hernia    Hyperlipidemia    Hypertension    Neuralgic migraines    "silent migraines"   Obesity    Vertigo    Allergies  Allergen Reactions   Morphine Itching    Hydrocodone is OK   Lisinopril Swelling    Numbness and swelling of lips    Topamax [Topiramate] Rash    Social History   Socioeconomic History   Marital status: Married    Spouse name: Linda [name]   Number of children: 0   Years of education: Masters   Highest education level: Not on file  Occupational History   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: [employer]  Tobacco Use   Smoking status: Never   Smokeless tobacco: Never  Vaping Use   Vaping status: Never Used  Substance and Sexual Activity   Alcohol use: Yes    Comment: maybe 1 a month   Drug use: No   Sexual activity: Yes    Birth control/protection: Post-menopausal  Other Topics Concern   Not on file  Social History Narrative   12/07/19   From: the area   Living: with husband, Linda [name]   Work: Teacher, English as a foreign language at [employer]   Education: [employer] grad, [place] 2013      Family: Step son - Linda [name]       Enjoys: sing - band shows      Exercise: walking on the treadmill    Diet: not doing great with this, trying to cut back on carb intake      Safety   Seat belts: Yes    Guns: Yes  and secure   Safe in relationships: Yes     left  handed    Social Determinants of Health   Financial Resource Strain: Not on file  Food Insecurity: No Food Insecurity (12/31/2022)   Hunger Vital Sign    Worried About Running Out of Food in the Last Year: Never true    Ran Out of Food in the Last Year: Never true  Transportation Needs: No Transportation Needs (12/31/2022)   PRAPARE - Administrator, Civil Service (Medical): No    Lack of Transportation (Non-Medical): No  Physical Activity: Not on file  Stress: Not on file  Social Connections: Not on file    Vitals:   04/12/23 1045  BP:  124/82  Pulse: 90  Resp: 12  Temp: 98.3 F (36.8 C)  SpO2: 97%   Body mass index is 38.83 kg/m.  Physical Exam Vitals and nursing note reviewed.  Constitutional:      General: She is not in acute distress.    Appearance: She is well-developed.  HENT:     Head: Normocephalic and atraumatic.     Right Ear: External ear normal. No tenderness. A middle ear effusion is present. Tympanic membrane is not erythematous.     Left Ear: Tympanic membrane, ear canal and external ear normal.     Ears:     Comments: There is no pain elicited with pressing tragus or pulling external ear.    Mouth/Throat:     Mouth: Mucous membranes are moist.     Pharynx: Oropharynx is clear.  Eyes:     Conjunctiva/sclera: Conjunctivae normal.  Cardiovascular:     Rate and Rhythm: Normal rate and regular rhythm.  Pulmonary:     Effort: Pulmonary effort is normal. No respiratory distress.     Breath sounds: Normal breath sounds.  Abdominal:     Palpations: Abdomen is soft. There is no mass.     Tenderness: There is no abdominal tenderness. There is no CVA tenderness, right CVA tenderness or left CVA tenderness.  Musculoskeletal:        General: No edema.  Lymphadenopathy:     Cervical: No cervical adenopathy.  Skin:    General: Skin is warm.     Findings: No erythema.  Neurological:     Mental Status: She is alert and oriented to person, place,  and time.  Psychiatric:        Mood and Affect: Affect normal. Mood is anxious.   ASSESSMENT AND PLAN:  Ms. Lookabill was seen today for dysuria and right earache.  Earache on right We discussed possible etiologies.  Right ear examination today with no signs of acute infectious process. Monitor for new symptoms. Agrees with trying topical treatment with Ciprodex. Instructed to let me know if earache does not resolve in a couple weeks or if it gets worse.  -     Ciprofloxacin-dexAMETHasone; Place 4 drops into the right ear 2 (two) times daily.  Dispense: 7.5 mL; Refill: 0 Hearing Screening   500Hz  1000Hz  2000Hz  4000Hz   Right ear Pass Pass Pass Pass  Left ear Pass Pass Pass Pass   Dysuria Urine dipstick here in the office negative except for positive blood. Differential diagnosis discussed. Problem has improved with Azo, will wait for urine culture result to make further recommendations. Monitor for new symptoms. Instructed about warning signs.  -     POCT Urinalysis Dipstick (Automated) -     Urine Microscopic -     Urine Culture   Return if symptoms worsen or fail to improve, for keep next appointment.  Taelor Moncada G. [place], MD  [facility]. [place] office.

## 2023-04-12 ENCOUNTER — Ambulatory Visit (INDEPENDENT_AMBULATORY_CARE_PROVIDER_SITE_OTHER): Payer: BC Managed Care – PPO | Admitting: Family Medicine

## 2023-04-12 VITALS — BP 124/82 | HR 90 | Temp 98.3°F | Resp 12 | Ht 60.0 in | Wt 198.8 lb

## 2023-04-12 DIAGNOSIS — N390 Urinary tract infection, site not specified: Secondary | ICD-10-CM | POA: Diagnosis not present

## 2023-04-12 DIAGNOSIS — H9201 Otalgia, right ear: Secondary | ICD-10-CM | POA: Diagnosis not present

## 2023-04-12 DIAGNOSIS — R300 Dysuria: Secondary | ICD-10-CM

## 2023-04-12 LAB — URINALYSIS, MICROSCOPIC ONLY

## 2023-04-12 LAB — POC URINALSYSI DIPSTICK (AUTOMATED)
Bilirubin, UA: NEGATIVE
Glucose, UA: NEGATIVE
Ketones, UA: NEGATIVE
Leukocytes, UA: NEGATIVE
Nitrite, UA: NEGATIVE
Protein, UA: NEGATIVE
Spec Grav, UA: 1.025 (ref 1.010–1.025)
Urobilinogen, UA: NEGATIVE E.U./dL — AB
pH, UA: 5.5 (ref 5.0–8.0)

## 2023-04-12 MED ORDER — CIPROFLOXACIN-DEXAMETHASONE 0.3-0.1 % OT SUSP
4.0000 [drp] | Freq: Two times a day (BID) | OTIC | 0 refills | Status: DC
Start: 2023-04-12 — End: 2023-10-04

## 2023-04-12 NOTE — Patient Instructions (Addendum)
A few things to remember from today's visit:  Dysuria - Plan: POCT Urinalysis Dipstick (Automated), Urine Microscopic Only, Culture, Urine  Earache on right  Urine done here in the office is not suggestive of uti. Will sent for evaluation and see how much blood is present. Continue adequate hydration. Ear examination today, small amount of fluid, no signs of infection. Try ear drops and monitor for new symptoms.  If you need refills for medications you take chronically, please call your pharmacy. Do not use My Chart to request refills or for acute issues that need immediate attention. If you send a my chart message, it may take a few days to be addressed, specially if I am not in the office.  Please be sure medication list is accurate. If a new problem present, please set up appointment sooner than planned today.

## 2023-04-13 ENCOUNTER — Encounter: Payer: Self-pay | Admitting: Family Medicine

## 2023-04-13 MED ORDER — NITROFURANTOIN MONOHYD MACRO 100 MG PO CAPS
100.0000 mg | ORAL_CAPSULE | Freq: Two times a day (BID) | ORAL | 0 refills | Status: AC
Start: 2023-04-13 — End: 2023-04-18

## 2023-04-15 LAB — URINE CULTURE
MICRO NUMBER:: 15405586
SPECIMEN QUALITY:: ADEQUATE

## 2023-04-25 DIAGNOSIS — R6884 Jaw pain: Secondary | ICD-10-CM | POA: Diagnosis not present

## 2023-04-25 DIAGNOSIS — H5461 Unqualified visual loss, right eye, normal vision left eye: Secondary | ICD-10-CM | POA: Diagnosis not present

## 2023-04-25 DIAGNOSIS — M316 Other giant cell arteritis: Secondary | ICD-10-CM | POA: Diagnosis not present

## 2023-04-25 DIAGNOSIS — R519 Headache, unspecified: Secondary | ICD-10-CM | POA: Diagnosis not present

## 2023-04-29 ENCOUNTER — Other Ambulatory Visit: Payer: Self-pay | Admitting: Family Medicine

## 2023-04-29 DIAGNOSIS — Z1231 Encounter for screening mammogram for malignant neoplasm of breast: Secondary | ICD-10-CM

## 2023-05-10 ENCOUNTER — Ambulatory Visit
Admission: RE | Admit: 2023-05-10 | Discharge: 2023-05-10 | Disposition: A | Discharge status: Home / Self Care | Payer: BC Managed Care – PPO | Source: Ambulatory Visit | Attending: Family Medicine | Admitting: Family Medicine

## 2023-05-10 DIAGNOSIS — Z1231 Encounter for screening mammogram for malignant neoplasm of breast: Secondary | ICD-10-CM

## 2023-06-03 ENCOUNTER — Ambulatory Visit (INDEPENDENT_AMBULATORY_CARE_PROVIDER_SITE_OTHER): Payer: BC Managed Care – PPO | Admitting: Family Medicine

## 2023-06-03 ENCOUNTER — Encounter: Payer: Self-pay | Admitting: Family Medicine

## 2023-06-03 VITALS — BP 120/80 | HR 88 | Temp 98.4°F | Resp 16 | Ht 60.0 in | Wt 210.0 lb

## 2023-06-03 DIAGNOSIS — Z7985 Long-term (current) use of injectable non-insulin antidiabetic drugs: Secondary | ICD-10-CM

## 2023-06-03 DIAGNOSIS — I152 Hypertension secondary to endocrine disorders: Secondary | ICD-10-CM

## 2023-06-03 DIAGNOSIS — M316 Other giant cell arteritis: Secondary | ICD-10-CM | POA: Diagnosis not present

## 2023-06-03 DIAGNOSIS — Z794 Long term (current) use of insulin: Secondary | ICD-10-CM

## 2023-06-03 DIAGNOSIS — Z7984 Long term (current) use of oral hypoglycemic drugs: Secondary | ICD-10-CM

## 2023-06-03 DIAGNOSIS — E1169 Type 2 diabetes mellitus with other specified complication: Secondary | ICD-10-CM

## 2023-06-03 DIAGNOSIS — E1159 Type 2 diabetes mellitus with other circulatory complications: Secondary | ICD-10-CM | POA: Diagnosis not present

## 2023-06-03 DIAGNOSIS — R202 Paresthesia of skin: Secondary | ICD-10-CM | POA: Diagnosis not present

## 2023-06-03 LAB — POCT GLYCOSYLATED HEMOGLOBIN (HGB A1C): Hemoglobin A1C: 7 % — AB (ref 4.0–5.6)

## 2023-06-03 MED ORDER — INSULIN GLARGINE-YFGN 100 UNIT/ML ~~LOC~~ SOLN
25.0000 [IU] | Freq: Every day | SUBCUTANEOUS | Status: DC
Start: 2023-06-03 — End: 2023-08-19

## 2023-06-03 MED ORDER — ROSUVASTATIN CALCIUM 20 MG PO TABS: 20.0000 mg | ORAL_TABLET | Freq: Every day | ORAL | 2 refills | Status: DC

## 2023-06-03 NOTE — Progress Notes (Signed)
HPI: [name] is a 62 y.o. female with a PMHx significant for DM II, HLD, vertiginous migraines, seasonal allergies, and HTN, who is here today for chronic disease management.  Last seen on 04/12/2023   Right temporal arteritis, following with rheumatologist and neurologist.   In 12/2022 she developed visual disturbance in the right eye, preceded by jaw claudication like symptoms/pain.  Brain MRI done on 12/28/2010 for was negative for acute intracranial process, no optic neuritis. Initial CRP was 0.7 and sedimentation rate 20. She underwent right temporal artery biopsy on 01/02/2023.  Since her last visit her prednisone dose has been decreased and currently she is taking 5 mg daily, decreased to current dose about 2 weeks ago. She reports that she feels much better that she did back in 12/2022.  She says her headache has improved but she notices she has had some joint pains returning as she weans off of the Prednisone. She says her jaw pain has also improved significantly.  She is currently on Actemra 162 mg.  She also follows with ophthalmology regularly due to right visual disturbances, next visit on 10/25.  Her next appointment with neurology is on 10/31  Hypertension:  She is not on any medications for blood pressure.  BP readings at home: She has not been checking her BP at home.  Negative for exertional chest pain, dyspnea,  focal weakness, or edema.  Lab Results  Component Value Date   CREATININE 0.80 01/02/2023   BUN 31 (H) 01/02/2023   NA 140 01/02/2023   K 3.8 01/02/2023   CL 107 01/02/2023   CO2 23 12/28/2022   Diabetes Mellitus II: Diagnosed in 09/2011.  She says her BG has been all over, with a range of 67-190. BS's < 70 is not frequent. She is currently taking metformin 500 mg daily, Ozempic 1 mg weekly, and Semglee 30 units daily.  She is only taking 1 metformin pill daily instead of 2 for the last month, she does not feel like she needs all current  medications, denies side effects. She has not been exercising.  Negative for symptoms of hypoglycemia, polyuria, polydipsia, foot ulcers/trauma  Lab Results  Component Value Date   HGBA1C 9.4 (A) 02/20/2023   Lab Results  Component Value Date   MICROALBUR 1.0 10/04/2022   She mentions she has been having a needling sensation in both hands for the last month.  She has had surgery carpal tunnel syndrome in her left hand in 06/2022.  Negative for weakness or changes in his skin. She has not noted a rash.  Review of Systems  Constitutional:  Negative for activity change, appetite change and fever.  HENT:  Negative for mouth sores and sore throat.   Respiratory:  Negative for cough and wheezing.   Gastrointestinal:  Negative for abdominal pain, nausea and vomiting.       Negative for changes in bowel habits.  Genitourinary:  Negative for decreased urine volume, dysuria and hematuria.  Musculoskeletal:  Positive for arthralgias.  Neurological:  Negative for syncope and facial asymmetry.  Psychiatric/Behavioral:  Negative for confusion and hallucinations.   See other pertinent positives and negatives in HPI.  Current Outpatient Medications on File Prior to Visit  Medication Sig Dispense Refill   ACTEMRA ACTPEN 162 MG/0.9ML SOAJ Inject into the skin.     amitriptyline (ELAVIL) 10 MG tablet TAKE 2 TABLETS BY MOUTH EVERY DAY AT NIGHT (Patient taking differently: Take 10-20 mg by mouth at bedtime as needed for sleep.)  180 tablet 3   Cal Carb-Mag Hydrox-Simeth (MYLANTA COAT & COOL) 1200-270-80 MG/10ML SUSP Take 15 mLs by mouth daily as needed (coat stomach).     ciprofloxacin-dexamethasone (CIPRODEX) OTIC suspension Place 4 drops into the right ear 2 (two) times daily. 7.5 mL 0   Continuous Glucose Receiver (FREESTYLE LIBRE 3 READER) DEVI Use to check blood sugars continuously. 1 each 0   Continuous Glucose Sensor (FREESTYLE LIBRE 3 SENSOR) MISC Use to check blood sugars continuously. 2 each  11   glucose blood test strip Onetouch Delica test strips; UAD to monitor glucose bid;Dx:E11.9 100 each 12   insulin glargine-yfgn (SEMGLEE, YFGN,) 100 UNIT/ML injection Inject 0.3 mLs (30 Units total) into the skin at bedtime. 9 mL 3   Insulin Pen Needle (PEN NEEDLES) 32G X 4 MM MISC To use with insulin 100 each 2   Insulin Syringe-Needle U-100 (GNP INSULIN SYRINGES 31GX5/16") 31G X 5/16" 0.3 ML MISC To use with insulin 100 each 3   metFORMIN (GLUCOPHAGE) 500 MG tablet Take 1 tablet (500 mg total) by mouth 2 (two) times daily with a meal. 180 tablet 2   OneTouch Delica Lancets 33G MISC UAD to monitor glucose bid;Dx E11.9 100 each 12   predniSONE (DELTASONE) 10 MG tablet Take 60 mg by mouth daily with breakfast.     rosuvastatin (CRESTOR) 20 MG tablet TAKE 1 TABLET BY MOUTH EVERY DAY 90 tablet 2   Semaglutide, 1 MG/DOSE, 4 MG/3ML SOPN Inject 1 mg as directed once a week. 9 mL 1   No current facility-administered medications on file prior to visit.    Past Medical History:  Diagnosis Date   Allergic rhinitis    Allergy    SEASONAL- pt states she has not had issues in "years"   Anemia    Cardiac murmur    1/6 SEM at [facility] (old finding per patient)- no heart murmur per Dr. Dayton Martes   Carpal tunnel syndrome 2023   Cubital tunnel syndrome 2023   Diabetes mellitus without complication (HCC)    GERD (gastroesophageal reflux disease)    History of hiatal hernia    Hyperlipidemia    Hypertension    Neuralgic migraines    "silent migraines"   Obesity    Vertigo    Allergies  Allergen Reactions   Morphine Itching    Hydrocodone is OK   Lisinopril Swelling    Numbness and swelling of lips    Topamax [Topiramate] Rash    Social History   Socioeconomic History   Marital status: Married    Spouse name: Charles   Number of children: 0   Years of education: Masters   Highest education level: Not on file  Occupational History   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: [employer]  Tobacco Use   Smoking status: Never   Smokeless tobacco: Never  Vaping Use   Vaping status: Never Used  Substance and Sexual Activity   Alcohol use: Yes    Comment: maybe 1 a month   Drug use: No   Sexual activity: Yes    Birth control/protection: Post-menopausal  Other Topics Concern   Not on file  Social History Narrative   12/07/19   From: the area   Living: with husband, Leonette Most   Work: Teacher, English as a foreign language at [employer]   Education: [employer] grad, [employer] 2013      Family: Step son - Orabelle Hemperly       Enjoys: sing - band shows  Exercise: walking on the treadmill    Diet: not doing great with this, trying to cut back on carb intake      Safety   Seat belts: Yes    Guns: Yes  and secure   Safe in relationships: Yes     left handed    Social Determinants of Health   Financial Resource Strain: Not on file  Food Insecurity: No Food Insecurity (12/31/2022)   Hunger Vital Sign    Worried About Running Out of Food in the Last Year: Never true    Ran Out of Food in the Last Year: Never true  Transportation Needs: No Transportation Needs (12/31/2022)   PRAPARE - Administrator, Civil Service (Medical): No    Lack of Transportation (Non-Medical): No  Physical Activity: Not on file  Stress: Not on file  Social Connections: Not on file   Today's Vitals   06/03/23 1603  BP: 120/80  Pulse: 88  Resp: 16  Temp: 98.4 F (36.9 C)  TempSrc: Oral  SpO2: 97%  Weight: 210 lb (95.3 kg)  Height: 5' (1.524 m)   Wt Readings from Last 3 Encounters:  06/03/23 210 lb (95.3 kg)  04/12/23 198 lb 12.8 oz (90.2 kg)  03/01/23 189 lb 6 oz (85.9 kg)   Body mass index is 41.01 kg/m.  Physical Exam Vitals and nursing note reviewed.  Constitutional:      General: She is not in acute distress.    Appearance: She is well-developed.  HENT:     Head: Normocephalic and atraumatic.     Mouth/Throat:     Mouth: Mucous membranes are moist.  Eyes:      Conjunctiva/sclera: Conjunctivae normal.  Cardiovascular:     Rate and Rhythm: Normal rate and regular rhythm.     Heart sounds: No murmur heard. Pulmonary:     Effort: Pulmonary effort is normal. No respiratory distress.     Breath sounds: Normal breath sounds.  Abdominal:     Palpations: Abdomen is soft. There is no mass.     Tenderness: There is no abdominal tenderness.  Musculoskeletal:     Right lower leg: No edema.     Left lower leg: No edema.     Comments: Tinel and Phalen negative bilateral.  Lymphadenopathy:     Cervical: No cervical adenopathy.  Skin:    General: Skin is warm.     Findings: No erythema or rash.  Neurological:     General: No focal deficit present.     Mental Status: She is alert and oriented to person, place, and time.     Gait: Gait normal.  Psychiatric:        Mood and Affect: Mood and affect normal.   ASSESSMENT AND PLAN:  Ms. Doney was seen today for chronic follow up.  Lab Results  Component Value Date   HGBA1C 7.0 (A) 06/03/2023   Type 2 diabetes mellitus with other specified complication, without long-term current use of insulin (HCC) Assessment & Plan: Last HgA1C improved, it was 9.4 and today 7.0. Recommend going back to metformin 500 mg twice daily. Decrease Semglee from 30 units to 25 units daily. Continue Ozempic 1 mg weekly. Hopefully as she continues weaning off the prednisone, we can continue decreasing Semglee dose. She is planning on starting regular exercise, recommend walking 10-15 min daily. F/U in 4 months.  Orders: -     POCT glycosylated hemoglobin (Hb A1C) -     Insulin Glargine-yfgn; Inject  0.25 mLs (25 Units total) into the skin at bedtime.  Hypertension associated with diabetes (HCC) Assessment & Plan: BP adequately controlled. Recommend monitoring BP at home. Continue non pharmacologic treatment. Eye exam is current.  Hand paresthesia, unspecified laterality We discussed possible etiologies,?  Carpal  tunnel syndrome. Recommend wearing a wrist splint at night. Monitor for new changes. Instructed to mention this problem to her neurologist during the next follow-up visit.  Temporal arteritis (HCC)  Diagnosed in 12/2022. Currently on prednisone 5 mg daily and Actemra. Planning on continue weaning off prednisone as tolerated. She follows with neurologist and rheumatologist.  Return in about 4 months (around 10/04/2023) for chronic problems.  I, Suanne Marker, acting as a scribe for Dmitry Macomber [place], MD., have documented all relevant documentation on the behalf of Gal Smolinski [place], MD, as directed by  Chantrice Hagg [place], MD while in the presence of Aiyannah Fayad [place], MD.   I, Suanne Marker, have reviewed all documentation for this visit. The documentation on 06/03/23 for the exam, diagnosis, procedures, and orders are all accurate and complete.  Brynlie Daza G. [place], MD  [facility]. [place] office.

## 2023-06-03 NOTE — Assessment & Plan Note (Signed)
Diagnosed in 12/2022. Currently on prednisone 5 mg daily and Actemra. Planning on continue weaning off prednisone as tolerated. She follows with neurologist and rheumatologist.

## 2023-06-03 NOTE — Assessment & Plan Note (Signed)
BP adequately controlled. Recommend monitoring BP at home. Continue non pharmacologic treatment. Eye exam is current.

## 2023-06-03 NOTE — Assessment & Plan Note (Signed)
Last HgA1C improved, it was 9.4 and today 7.0. Recommend going back to metformin 500 mg twice daily. Decrease Semglee from 30 units to 25 units daily. Continue Ozempic 1 mg weekly. Hopefully as she continues within of prednisone, we can continue decreasing Semglee dose. F/U in 4 months.

## 2023-06-03 NOTE — Patient Instructions (Addendum)
A few things to remember from today's visit:  Type 2 diabetes mellitus with other specified complication, without long-term current use of insulin (HCC) - Plan: POC HgB A1c  Hypertension associated with diabetes (HCC)  Type 2 diabetes mellitus with hyperglycemia, without long-term current use of insulin (HCC) - Plan: insulin glargine-yfgn (SEMGLEE, YFGN,) 100 UNIT/ML injection  Go back to Metformin 2 times daily. Hand needle sensation could be carpal tunnel synd. Try night wrist splints. Decrease Semglee dose from 30 U to 25 U daily.  If you need refills for medications you take chronically, please call your pharmacy. Do not use My Chart to request refills or for acute issues that need immediate attention. If you send a my chart message, it may take a few days to be addressed, specially if I am not in the office.  Please be sure medication list is accurate. If a new problem present, please set up appointment sooner than planned today.   Health Maintenance Due  Topic Date Due   Cervical Cancer Screening (HPV/Pap Cotest)  08/12/2022   INFLUENZA VACCINE  03/14/2023   COVID-19 Vaccine (5 - 2023-24 season) 04/14/2023       02/20/2023   11:33 AM 10/03/2022   11:29 AM 09/22/2021    2:14 PM  Depression screen PHQ 2/9  Decreased Interest 0 0 0  Down, Depressed, Hopeless 0 0 0  PHQ - 2 Score 0 0 0  Altered sleeping 0  0  Tired, decreased energy 0  0  Change in appetite 0  0  Feeling bad or failure about yourself  0  0  Trouble concentrating 0  0  Moving slowly or fidgety/restless 0  0  Suicidal thoughts 0  0  PHQ-9 Score 0  0  Difficult doing work/chores Not difficult at all

## 2023-06-07 DIAGNOSIS — H4720 Unspecified optic atrophy: Secondary | ICD-10-CM | POA: Diagnosis not present

## 2023-06-11 ENCOUNTER — Ambulatory Visit: Payer: BC Managed Care – PPO | Admitting: Family Medicine

## 2023-06-11 ENCOUNTER — Encounter: Payer: Self-pay | Admitting: Family Medicine

## 2023-06-11 VITALS — BP 126/80 | HR 90 | Resp 16 | Ht 60.0 in | Wt 210.0 lb

## 2023-06-11 DIAGNOSIS — R600 Localized edema: Secondary | ICD-10-CM | POA: Diagnosis not present

## 2023-06-11 DIAGNOSIS — L819 Disorder of pigmentation, unspecified: Secondary | ICD-10-CM

## 2023-06-11 NOTE — Progress Notes (Signed)
ACUTE VISIT Chief Complaint  Patient presents with   thin circle above both ankles   HPI: [name] Reveal is a 62 y.o. female with a PMHx significant for DM II, HLD, vertiginous migraines, seasonal allergies, and HTN, who is here today complaining coloration skin changes around ankles noted after her last visit, 06/03/23. She denies any associated pain or erythema.  RLE edema, which has been going on for some time. Stable. She says she has had some bruising on forearm lately, but no nosebleeds or gum bleeds.  She also mentions her right knee has been hurting.   She has an appointment with rheumatology on 10/31.  Temporal arteritis, she is on Prednisone 4 mg daily, dose has been decreased as tolerated, notice more joint pain with the process.  She saw ophthalmologist recently and according to pt, her right peripheral vision will not return to normal, she has reached the max improvement at this time.She will seen him tomorrow to be fitted for glasses.   Review of Systems  Constitutional:  Positive for fatigue. Negative for activity change, appetite change, chills and fever.  Respiratory:  Negative for cough and shortness of breath.   Cardiovascular:  Negative for chest pain and palpitations.  Gastrointestinal:  Negative for abdominal pain, nausea and vomiting.  Genitourinary:  Negative for decreased urine volume, dysuria and hematuria.  Skin:  Negative for rash.  Neurological:  Negative for weakness and numbness.  See other pertinent positives and negatives in HPI.  Current Outpatient Medications on File Prior to Visit  Medication Sig Dispense Refill   ACTEMRA ACTPEN 162 MG/0.9ML SOAJ Inject into the skin.     amitriptyline (ELAVIL) 10 MG tablet TAKE 2 TABLETS BY MOUTH EVERY DAY AT NIGHT (Patient taking differently: Take 10-20 mg by mouth at bedtime as needed for sleep.) 180 tablet 3   Cal Carb-Mag Hydrox-Simeth (MYLANTA COAT & COOL) 1200-270-80 MG/10ML SUSP Take 15 mLs by mouth  daily as needed (coat stomach).     ciprofloxacin-dexamethasone (CIPRODEX) OTIC suspension Place 4 drops into the right ear 2 (two) times daily. 7.5 mL 0   Continuous Glucose Receiver (FREESTYLE LIBRE 3 READER) DEVI Use to check blood sugars continuously. 1 each 0   Continuous Glucose Sensor (FREESTYLE LIBRE 3 SENSOR) MISC Use to check blood sugars continuously. 2 each 11   glucose blood test strip Onetouch Delica test strips; UAD to monitor glucose bid;Dx:E11.9 100 each 12   insulin glargine-yfgn (SEMGLEE, YFGN,) 100 UNIT/ML injection Inject 0.25 mLs (25 Units total) into the skin at bedtime.     Insulin Pen Needle (PEN NEEDLES) 32G X 4 MM MISC To use with insulin 100 each 2   Insulin Syringe-Needle U-100 (GNP INSULIN SYRINGES 31GX5/16") 31G X 5/16" 0.3 ML MISC To use with insulin 100 each 3   metFORMIN (GLUCOPHAGE) 500 MG tablet Take 1 tablet (500 mg total) by mouth 2 (two) times daily with a meal. 180 tablet 2   OneTouch Delica Lancets 33G MISC UAD to monitor glucose bid;Dx E11.9 100 each 12   predniSONE (DELTASONE) 10 MG tablet Take 60 mg by mouth daily with breakfast.     rosuvastatin (CRESTOR) 20 MG tablet Take 1 tablet (20 mg total) by mouth daily. 90 tablet 2   Semaglutide, 1 MG/DOSE, 4 MG/3ML SOPN Inject 1 mg as directed once a week. 9 mL 1   No current facility-administered medications on file prior to visit.   Past Medical History:  Diagnosis Date   Allergic rhinitis  Allergy    SEASONAL- pt states she has not had issues in "years"   Anemia    Cardiac murmur    1/6 SEM at [facility] (old finding per patient)- no heart murmur per Dr. Dayton Martes   Carpal tunnel syndrome 2023   Cubital tunnel syndrome 2023   Diabetes mellitus without complication (HCC)    GERD (gastroesophageal reflux disease)    History of hiatal hernia    Hyperlipidemia    Hypertension    Neuralgic migraines    "silent migraines"   Obesity    Vertigo    Allergies  Allergen Reactions   Morphine Itching     Hydrocodone is OK   Lisinopril Swelling    Numbness and swelling of lips    Topamax [Topiramate] Rash    Social History   Socioeconomic History   Marital status: Married    Spouse name: Charles   Number of children: 0   Years of education: Masters   Highest education level: Not on file  Occupational History   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: [employer]  Tobacco Use   Smoking status: Never   Smokeless tobacco: Never  Vaping Use   Vaping status: Never Used  Substance and Sexual Activity   Alcohol use: Yes    Comment: maybe 1 a month   Drug use: No   Sexual activity: Yes    Birth control/protection: Post-menopausal  Other Topics Concern   Not on file  Social History Narrative   12/07/19   From: the area   Living: with husband, Hydrologist   Work: Teacher, English as a foreign language at [employer]   Education: [employer] grad, [employer] 2013      Family: Step son - Aloha Knieriem       Enjoys: sing - band shows      Exercise: walking on the treadmill    Diet: not doing great with this, trying to cut back on carb intake      Safety   Seat belts: Yes    Guns: Yes  and secure   Safe in relationships: Yes     left handed    Social Determinants of Health   Financial Resource Strain: Not on file  Food Insecurity: No Food Insecurity (12/31/2022)   Hunger Vital Sign    Worried About Running Out of Food in the Last Year: Never true    Ran Out of Food in the Last Year: Never true  Transportation Needs: No Transportation Needs (12/31/2022)   PRAPARE - Administrator, Civil Service (Medical): No    Lack of Transportation (Non-Medical): No  Physical Activity: Not on file  Stress: Not on file  Social Connections: Not on file   Vitals:   06/11/23 1216  BP: 126/80  Pulse: 90  Resp: 16  SpO2: 99%   Body mass index is 41.01 kg/m.  Physical Exam Vitals and nursing note reviewed.  Constitutional:      General: She is not in acute distress.    Appearance: She is  well-developed.  HENT:     Head: Normocephalic and atraumatic.  Eyes:     Conjunctiva/sclera: Conjunctivae normal.  Cardiovascular:     Rate and Rhythm: Normal rate and regular rhythm.     Pulses:          Dorsalis pedis pulses are 2+ on the right side and 2+ on the left side.     Heart sounds: No murmur heard.    Comments: RLE trace  pitting edema, no tenderness or erythema. Pulmonary:     Effort: Pulmonary effort is normal. No respiratory distress.     Breath sounds: Normal breath sounds.  Musculoskeletal:     Right lower leg: No tenderness.     Left lower leg: No tenderness.  Skin:    General: Skin is warm.     Findings: No erythema or rash.       Neurological:     General: No focal deficit present.     Mental Status: She is alert and oriented to person, place, and time.     Gait: Gait normal.  Psychiatric:        Mood and Affect: Affect normal. Mood is anxious.     Comments: Well groomed, good eye contact.    ASSESSMENT AND PLAN:  Ms. Manibusan was seen today for a bilateral ankle rash.   Change in pigmented skin lesion Area affected matches her where her short boots were in contact with skin she was wearing last visit. Could be postinflammatory/irritation changes. Hx and examination do not suggest a serious process. I do not think further work up is needed at this time. Monitor for new symptoms.  Lower extremity edema RLE, mild, no associated tenderness or erythema.  Most likely vein insufficiency. LE elevation recommended.  Return if symptoms worsen or fail to improve.  I, Rolla Etienne Wierda, acting as a scribe for Ellese Julius [place], MD., have documented all relevant documentation on the behalf of Drew Lips [place], MD, as directed by  Makhia Vosler [place], MD while in the presence of Reynalda Canny [place], MD.   I, Yasmyn Bellisario [place], MD, have reviewed all documentation for this visit. The documentation on 06/13/23 for the exam, diagnosis, procedures, and orders are all accurate and  complete.  Drezden Seitzinger G. [place], MD  [facility]. [place] office.

## 2023-06-13 ENCOUNTER — Encounter: Payer: Self-pay | Admitting: Family Medicine

## 2023-06-13 DIAGNOSIS — R6884 Jaw pain: Secondary | ICD-10-CM | POA: Diagnosis not present

## 2023-06-13 DIAGNOSIS — M316 Other giant cell arteritis: Secondary | ICD-10-CM | POA: Diagnosis not present

## 2023-06-13 DIAGNOSIS — R519 Headache, unspecified: Secondary | ICD-10-CM | POA: Diagnosis not present

## 2023-06-13 DIAGNOSIS — H5461 Unqualified visual loss, right eye, normal vision left eye: Secondary | ICD-10-CM | POA: Diagnosis not present

## 2023-06-21 ENCOUNTER — Other Ambulatory Visit: Payer: Self-pay

## 2023-06-21 MED ORDER — FREESTYLE LIBRE 3 PLUS SENSOR MISC: 3 refills | Status: DC

## 2023-08-13 ENCOUNTER — Other Ambulatory Visit: Payer: Self-pay | Admitting: Family Medicine

## 2023-08-13 DIAGNOSIS — E1169 Type 2 diabetes mellitus with other specified complication: Secondary | ICD-10-CM

## 2023-08-15 DIAGNOSIS — R6884 Jaw pain: Secondary | ICD-10-CM | POA: Diagnosis not present

## 2023-08-15 DIAGNOSIS — R519 Headache, unspecified: Secondary | ICD-10-CM | POA: Diagnosis not present

## 2023-08-15 DIAGNOSIS — M316 Other giant cell arteritis: Secondary | ICD-10-CM | POA: Diagnosis not present

## 2023-08-15 DIAGNOSIS — H5461 Unqualified visual loss, right eye, normal vision left eye: Secondary | ICD-10-CM | POA: Diagnosis not present

## 2023-09-11 ENCOUNTER — Other Ambulatory Visit: Payer: Self-pay | Admitting: Family Medicine

## 2023-09-11 DIAGNOSIS — E1169 Type 2 diabetes mellitus with other specified complication: Secondary | ICD-10-CM

## 2023-09-13 ENCOUNTER — Encounter: Payer: Self-pay | Admitting: Family Medicine

## 2023-09-16 DIAGNOSIS — R7989 Other specified abnormal findings of blood chemistry: Secondary | ICD-10-CM | POA: Diagnosis not present

## 2023-09-22 ENCOUNTER — Encounter: Payer: Self-pay | Admitting: Family Medicine

## 2023-09-27 ENCOUNTER — Other Ambulatory Visit: Payer: Self-pay | Admitting: Family Medicine

## 2023-09-27 MED ORDER — SIMVASTATIN 20 MG PO TABS: 20.0000 mg | ORAL_TABLET | Freq: Every day | ORAL | 1 refills | Status: DC

## 2023-10-04 ENCOUNTER — Ambulatory Visit: Payer: BC Managed Care – PPO | Admitting: Family Medicine

## 2023-10-04 ENCOUNTER — Encounter: Payer: Self-pay | Admitting: Family Medicine

## 2023-10-04 VITALS — BP 128/84 | HR 85 | Temp 98.0°F | Resp 16 | Ht 60.0 in | Wt 216.4 lb

## 2023-10-04 DIAGNOSIS — E1159 Type 2 diabetes mellitus with other circulatory complications: Secondary | ICD-10-CM

## 2023-10-04 DIAGNOSIS — M25561 Pain in right knee: Secondary | ICD-10-CM

## 2023-10-04 DIAGNOSIS — R200 Anesthesia of skin: Secondary | ICD-10-CM

## 2023-10-04 DIAGNOSIS — G8929 Other chronic pain: Secondary | ICD-10-CM | POA: Insufficient documentation

## 2023-10-04 DIAGNOSIS — R202 Paresthesia of skin: Secondary | ICD-10-CM

## 2023-10-04 DIAGNOSIS — I152 Hypertension secondary to endocrine disorders: Secondary | ICD-10-CM

## 2023-10-04 DIAGNOSIS — M25562 Pain in left knee: Secondary | ICD-10-CM

## 2023-10-04 DIAGNOSIS — E785 Hyperlipidemia, unspecified: Secondary | ICD-10-CM | POA: Diagnosis not present

## 2023-10-04 DIAGNOSIS — E1169 Type 2 diabetes mellitus with other specified complication: Secondary | ICD-10-CM

## 2023-10-04 LAB — POCT GLYCOSYLATED HEMOGLOBIN (HGB A1C): Hemoglobin A1C: 6.4 % — AB (ref 4.0–5.6)

## 2023-10-04 NOTE — Patient Instructions (Addendum)
 A few things to remember from today's visit:  Type 2 diabetes mellitus with other specified complication, without long-term current use of insulin (HCC) - Plan: POC HgB A1c, Amb Referral to Nutrition and Diabetic Education  Numbness and tingling of hand - Plan: Ambulatory referral to Sports Medicine  Chronic pain of both knees  Hold on statin for 4 weeks and see if knee pain improves, if it does, try 3 times per week. Monitor blood pressure at home. Rest unchanged.  If you need refills for medications you take chronically, please call your pharmacy. Do not use My Chart to request refills or for acute issues that need immediate attention. If you send a my chart message, it may take a few days to be addressed, specially if I am not in the office.  Please be sure medication list is accurate. If a new problem present, please set up appointment sooner than planned today.

## 2023-10-04 NOTE — Progress Notes (Signed)
 HPI: Linda [name] is a 63 y.o. female with a PMHx significant for DM II, HLD, vertiginous migraines, seasonal allergies, and HTN, who is here today for chronic disease management.  Last seen on 06/11/2023  Diabetes Mellitus II:  Diagnosed in 09/2011  - Checking BG at home: She checks her BG at home and says they are usually around 120.  - Medications: Currently on Ozempic 1 mg daily. No longer taking Metformin 500 mg or Semglee 30 units daily because she was worried about the possibility of side effects. - Diet: She snacks on cookies and cakes occasionally. She has not seen a nutritionist before.  - Exercise: She has been using the elliptical machine for exercise.   - Vision: UTD on routine vision care.  - Foot exam: performed today.  - Negative for symptoms of hypoglycemia, polyuria, polydipsia,foot ulcers/trauma  Lab Results  Component Value Date   HGBA1C 7.0 (A) 10/04/2023   Lab Results  Component Value Date   MICROALBUR 1.0 10/04/2022   Hyperlipidemia: Currently on simvastatin 20 mg daily.  Side effects from medication: She believes her knee pain may be related to her simvastatin. She also had arthralgias when she was on Rosuvastatin.  Lab Results  Component Value Date   CHOL 161 10/04/2022   HDL 76.00 10/04/2022   LDLCALC 75 10/04/2022   LDLDIRECT 118.9 07/05/2014   TRIG 52.0 10/04/2022   CHOLHDL 2 10/04/2022   Hypertension:  Medications: Not currently on pharmacologic treatment.  BP readings at home: She is not checking her BP at home.  She has some problems with lower extremity edema, for which she wears compression stockings.   Negative for unusual or severe headache, visual changes, exertional chest pain, dyspnea, or  focal weakness.  Lab Results  Component Value Date   CREATININE 0.80 01/02/2023   BUN 31 (H) 01/02/2023   NA 140 01/02/2023   K 3.8 01/02/2023   CL 107 01/02/2023   CO2 23 12/28/2022   Tingling and numbness in hands:  Patient also  mentions she has been having tingling in her hands for about 8 months. The left hand is worse than the right.  The tingling lasts for about a minute at the time, and improves with rubbing/moving her hands together or washing them with warm water.  She has a hx of carpal tunnel syndrome, and had surgery on the left arm in 06/2022.  Left handed. She says the tingling is worse at night.  She sleeps with a splint on both wrists, but says it doesn't help much.   Knee pain:  Patient also complains of significant knee pain.  She says the right knee is worse than the left knee.  Exacerbated by going up stairs. She believes it may be related to her statin.  Follows with rheumatologist for temporal arteritis. Currently on Actemra. Reports mildly abnormal liver tests and has been advised to cut back on alcohol and tumeric. LFT's repeated during her last visit with rheumatologist and numbers have normalized.  Review of Systems  Constitutional:  Negative for activity change, appetite change and fever.  HENT:  Negative for mouth sores, nosebleeds and sore throat.   Respiratory:  Negative for cough and wheezing.   Gastrointestinal:  Negative for abdominal pain, nausea and vomiting.  Endocrine: Negative for cold intolerance and heat intolerance.  Genitourinary:  Negative for decreased urine volume, dysuria and hematuria.  Musculoskeletal:  Positive for arthralgias. Negative for gait problem and joint swelling.  Skin:  Negative for rash.  Neurological:  Negative for syncope and facial asymmetry.  Psychiatric/Behavioral:  Negative for confusion and hallucinations.   See other pertinent positives and negatives in HPI.  Current Outpatient Medications on File Prior to Visit  Medication Sig Dispense Refill   amitriptyline (ELAVIL) 10 MG tablet TAKE 2 TABLETS BY MOUTH EVERY DAY AT NIGHT (Patient taking differently: Take 10-20 mg by mouth at bedtime as needed for sleep.) 180 tablet 3   Continuous Glucose  Receiver (FREESTYLE LIBRE 3 READER) DEVI Use to check blood sugars continuously. 1 each 0   Continuous Glucose Sensor (FREESTYLE LIBRE 3 PLUS SENSOR) MISC Change sensor every 15 days. 3 each 3   glucose blood test strip Onetouch Delica test strips; UAD to monitor glucose bid;Dx:E11.9 100 each 12   Insulin Pen Needle (PEN NEEDLES) 32G X 4 MM MISC To use with insulin 100 each 2   Insulin Syringe-Needle U-100 (GNP INSULIN SYRINGES 31GX5/16") 31G X 5/16" 0.3 ML MISC To use with insulin 100 each 3   OZEMPIC, 1 MG/DOSE, 4 MG/3ML SOPN INJECT 1 MG UNDER THE SKIN ONCE A WEEK AS DIRECTED. 9 mL 1   simvastatin (ZOCOR) 20 MG tablet Take 1 tablet (20 mg total) by mouth at bedtime. 90 tablet 1   ACTEMRA ACTPEN 162 MG/0.9ML SOAJ Inject into the skin. (Patient not taking: Reported on 10/04/2023)     Cal Carb-Mag Hydrox-Simeth (MYLANTA COAT & COOL) 1200-270-80 MG/10ML SUSP Take 15 mLs by mouth daily as needed (coat stomach). (Patient not taking: Reported on 10/04/2023)     No current facility-administered medications on file prior to visit.    Past Medical History:  Diagnosis Date   Allergic rhinitis    Allergy    SEASONAL- pt states she has not had issues in "years"   Anemia    Cardiac murmur    1/6 SEM at [facility] (old finding per patient)- no heart murmur per Dr. Dayton Martes   Carpal tunnel syndrome 2023   Cubital tunnel syndrome 2023   Diabetes mellitus without complication (HCC)    GERD (gastroesophageal reflux disease)    History of hiatal hernia    Hyperlipidemia    Hypertension    Neuralgic migraines    "silent migraines"   Obesity    Vertigo    Allergies  Allergen Reactions   Morphine Itching    Hydrocodone is OK   Lisinopril Swelling    Numbness and swelling of lips    Topamax [Topiramate] Rash    Social History   Socioeconomic History   Marital status: Married    Spouse name: Charles   Number of children: 0   Years of education: Masters   Highest education level: Not on file   Occupational History   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: [employer]  Tobacco Use   Smoking status: Never   Smokeless tobacco: Never  Vaping Use   Vaping status: Never Used  Substance and Sexual Activity   Alcohol use: Yes    Comment: maybe 1 a month   Drug use: No   Sexual activity: Yes    Birth control/protection: Post-menopausal  Other Topics Concern   Not on file  Social History Narrative   12/07/19   From: the area   Living: with husband, Leonette Most   Work: Teacher, English as a foreign language at [employer]   Education: [employer] grad, [place] 2013      Family: Step son - Anthonella Klausner       Enjoys: sing - band shows  Exercise: walking on the treadmill    Diet: not doing great with this, trying to cut back on carb intake      Safety   Seat belts: Yes    Guns: Yes  and secure   Safe in relationships: Yes     left handed    Social Drivers of Health   Financial Resource Strain: Not on file  Food Insecurity: No Food Insecurity (12/31/2022)   Hunger Vital Sign    Worried About Running Out of Food in the Last Year: Never true    Ran Out of Food in the Last Year: Never true  Transportation Needs: No Transportation Needs (12/31/2022)   PRAPARE - Administrator, Civil Service (Medical): No    Lack of Transportation (Non-Medical): No  Physical Activity: Not on file  Stress: Not on file  Social Connections: Not on file   Today's Vitals   10/04/23 1449  BP: 128/84  Pulse: 85  Resp: 16  Temp: 98 F (36.7 C)  TempSrc: Oral  SpO2: 98%  Weight: 216 lb 6.4 oz (98.2 kg)  Height: 5' (1.524 m)   Body mass index is 42.26 kg/m.  Physical Exam Vitals and nursing note reviewed.  Constitutional:      General: She is not in acute distress.    Appearance: She is well-developed.  HENT:     Head: Normocephalic and atraumatic.     Mouth/Throat:     Mouth: Mucous membranes are moist.     Pharynx: Oropharynx is clear.  Eyes:     Conjunctiva/sclera: Conjunctivae  normal.  Cardiovascular:     Rate and Rhythm: Normal rate and regular rhythm.     Pulses:          Dorsalis pedis pulses are 2+ on the right side and 2+ on the left side.     Heart sounds: No murmur heard.    Comments: Trace bilateral pitting edema.  Pulmonary:     Effort: Pulmonary effort is normal. No respiratory distress.     Breath sounds: Normal breath sounds.  Abdominal:     Palpations: Abdomen is soft. There is no hepatomegaly or mass.     Tenderness: There is no abdominal tenderness.  Musculoskeletal:     Right lower leg: Pitting Edema present.     Left lower leg: Pitting Edema present.     Comments: Tinel and Phalen negative bilateral. No signs of synovitis.  Lymphadenopathy:     Cervical: No cervical adenopathy.  Skin:    General: Skin is warm.     Findings: No erythema or rash.  Neurological:     General: No focal deficit present.     Mental Status: She is alert and oriented to person, place, and time.     Cranial Nerves: No cranial nerve deficit.     Gait: Gait normal.  Psychiatric:        Mood and Affect: Affect normal. Mood is anxious.    ASSESSMENT AND PLAN:  Ms. Wolven was seen today for chronic disease management.   Orders Placed This Encounter  Procedures   Ambulatory referral to Sports Medicine   Amb Referral to Nutrition and Diabetic Education   POC HgB A1c   Lab Results  Component Value Date   HGBA1C 6.4 (A) 10/04/2023   Chronic pain of both knees Assessment & Plan: We discussed differential Dx's. Aggravated by statins. Most likely OA. Has also been addressed by her rheumatologist. Wt loss will help with progression.  Type 2 diabetes mellitus with other specified complication, without long-term current use of insulin (HCC) Assessment & Plan: HgA1C is at goal, it went from 7.0 to 6.4. Continue Ozempic 1 mg weekly. Regular exercise and healthy diet with avoidance of added sugar food intake is an important part of treatment and  recommended. Annual eye exam, periodic dental and foot care recommended. F/U in 5-6 months.  Orders: -     POCT glycosylated hemoglobin (Hb A1C) -     Amb Referral to Nutrition and Diabetic Education  Numbness and tingling of hand Chronic. Wrist splints did not help. Differential Dx discussed, ? Carpal tunnel synd. Sport medicine referral placed.  -     Ambulatory referral to Sports Medicine  Hyperlipidemia, unspecified hyperlipidemia type Assessment & Plan: We discussed benefits of statins. Last LDL 75 in 09/2022. Rosuvastatin caused arthralgias. Having similar side effects with Simvastatin, which she took in the past and tolerated well. Recommend holding on Simvastatin for 4 weeks and monitor for changes, if greatly improved, she could consider taking medication 3 times per week or we can changes to Zetia. Low fat diet also recommended. Will plan on fasting labs next visit.  Hypertension associated with diabetes (HCC) Assessment & Plan: DBP mildly elevated today. Recommend monitoring BP at home. Continue non pharmacologic treatment for now.  Return in about 6 months (around 04/02/2024) for chronic problems.  I, Rolla Etienne Wierda, acting as a scribe for Kikuye Korenek [place], MD., have documented all relevant documentation on the behalf of Lilianah Buffin [place], MD, as directed by  Kiyana Vazguez [place], MD while in the presence of Walker Sitar [place], MD.   I, Juno Alers [place], MD, have reviewed all documentation for this visit. The documentation on 10/05/23 for the exam, diagnosis, procedures, and orders are all accurate and complete.  Lenus Trauger G. [place], MD  [facility]. [place] office.

## 2023-10-05 NOTE — Assessment & Plan Note (Signed)
 DBP mildly elevated today. Recommend monitoring BP at home. Continue non pharmacologic treatment for now.

## 2023-10-05 NOTE — Assessment & Plan Note (Signed)
 We discussed differential Dx's. Aggravated by statins. Most likely OA. Has also been addressed by her rheumatologist. Wt loss will help with progression.

## 2023-10-05 NOTE — Assessment & Plan Note (Signed)
 We discussed benefits of statins. Last LDL 75 in 09/2022. Rosuvastatin caused arthralgias. Having similar side effects with Simvastatin, which she took in the past and tolerated well. Recommend holding on Simvastatin for 4 weeks and monitor for changes, if greatly improved, she could consider taking medication 3 times per week or we can changes to Zetia. Low fat diet also recommended. Will plan on fasting labs next visit.

## 2023-10-05 NOTE — Assessment & Plan Note (Signed)
 HgA1C is at goal, it went from 7.0 to 6.4. Continue Ozempic 1 mg weekly. Regular exercise and healthy diet with avoidance of added sugar food intake is an important part of treatment and recommended. Annual eye exam, periodic dental and foot care recommended. F/U in 5-6 months.

## 2023-10-11 ENCOUNTER — Encounter: Payer: Self-pay | Admitting: Neurology

## 2023-10-11 ENCOUNTER — Ambulatory Visit: Payer: BC Managed Care – PPO | Admitting: Neurology

## 2023-10-11 VITALS — BP 132/89 | HR 85 | Ht 60.0 in | Wt 213.0 lb

## 2023-10-11 DIAGNOSIS — G43109 Migraine with aura, not intractable, without status migrainosus: Secondary | ICD-10-CM | POA: Diagnosis not present

## 2023-10-11 DIAGNOSIS — M316 Other giant cell arteritis: Secondary | ICD-10-CM | POA: Diagnosis not present

## 2023-10-11 MED ORDER — AMITRIPTYLINE HCL 10 MG PO TABS: ORAL_TABLET | ORAL | 3 refills | Status: DC

## 2023-10-11 NOTE — Progress Notes (Signed)
 NEUROLOGY FOLLOW UP OFFICE NOTE  Linda [name] [id number] 1961-06-06  HISTORY OF PRESENT ILLNESS: I had the pleasure of seeing Linda [name] in follow-up in the neurology clinic on 10/11/2023.  The patient was last seen a year ago for dizziness felt secondary to migraines. MRI brain normal. She has been to [facility] and [facility] for second opinions. [facility] felt symptoms likely migraine-related dizziness. [facility] noted vestibular migraines is a diagnosis of exclusion and wondered if hypertensive urgency could be causing some of her symptoms. She has been on amitriptyline for migraine prophylaxis. She contacted our office in 12/2022 about blurred vision, photophobia, lightheadedness. She was seeing things moving. She then went to the eye doctor and was diagnosed with right optic neuritis and was told by her doctor to call our office because she may need IV steroids. We tried to contact the infusion center but could not get her in form IV steroids and was instructed to go to the ER. She went to [facility] where MRI brain and orbits with and without contrast was normal, there was mild to moderate chronic microvascular disease. On call ophthalmology recommended ESR which was 20/CRP 0.7, she was started on Prednisone then had a temporal artery biopsy on 5/22 which was negative for arteritis. She had neck pain, bulging in the temples, and jaw pain, so despite a negative biopsy, she was treated with high dose prednisone. She continues to follow-up with Ophthalmology and Rheumatology, weaned off steroids in December. She still has vision loss on the right eye, there is a dark line like a gray cloud in the center of her vision. She states there is no more pain, it feels like something is in her eye. She reports joint pains stopped after stopping her statin. She had been taking the amitriptyline daily since steroids were started because she was having sleep difficulties. She denies any typical  migraines or dizziness.    History on Initial Assessment 03/16/2016: This is a pleasant 63 yo RH woman with a history of diabetes, GERD, with dizziness. The symptoms started on 01/06/16 while she was driving home when she started feeling as if she would pass out, dizzy with a spinning sensation. She pulled over and called her husband, but was able to eventually drive home. She noticed she would veer to the right when walking. She saw her PCP and was noted to have nystagmus with Dix-Hallpike testing and impacted cerumen on the left. She returned with ear fullness on the right and again had cerumen disimpaction. She continued to have the dizziness, improved with meclizine, but this caused drowsiness. She underwent vestibular therapy with significant improvement in the dizziness. The spinning sensation has resolved, however she continues to have a residual "floating" sensation particularly noticeable when she is driving. This would be associated with nausea, no vomiting. If she is driving and staying focused forward, she starts feeling "not normal," her ears will pop, "almost like a pressure," then this would be relieved when she looks off to the side. She constantly feels that there is a glare in her vision, more at night, when things do not seem clear. She had an eye exam which was normal. She has seen ENT and has been told she may have had a viral illness, given a steroid injection last June. She reports a history of herpes simplex from many years ago, when stressed out she feels a "nerve pain" on her arms without any visible lesions. She denies any associated headaches, focal  numbness/tingling/weakness. She reports that 3 months prior to the onset of these symptoms, she noticed she would feel lightheaded in the morning while driving. No diplopia, dysarthria/dysphagia, bowel/bladder dysfunction. She has some left-sided neck pain that improved with PT. She denies any head injuries or recent infections. She had been  taking Phentermine for weight loss, but stopped it in May when she started having the neck pain. She denies any daytime drowsiness, sleep is good.   PAST MEDICAL HISTORY: Past Medical History:  Diagnosis Date   Allergic rhinitis    Allergy    SEASONAL- pt states she has not had issues in "years"   Anemia    Cardiac murmur    1/6 SEM at [facility] (old finding per patient)- no heart murmur per Dr. Dayton Martes   Carpal tunnel syndrome 2023   Cubital tunnel syndrome 2023   Diabetes mellitus without complication (HCC)    GERD (gastroesophageal reflux disease)    History of hiatal hernia    Hyperlipidemia    Hypertension    Neuralgic migraines    "silent migraines"   Obesity    Vertigo     MEDICATIONS: Current Outpatient Medications on File Prior to Visit  Medication Sig Dispense Refill   amitriptyline (ELAVIL) 10 MG tablet TAKE 2 TABLETS BY MOUTH EVERY DAY AT NIGHT (Patient taking differently: Take 10-20 mg by mouth at bedtime as needed for sleep.) 180 tablet 3   Cal Carb-Mag Hydrox-Simeth (MYLANTA COAT & COOL) 1200-270-80 MG/10ML SUSP Take 15 mLs by mouth daily as needed (coat stomach).     Continuous Glucose Receiver (FREESTYLE LIBRE 3 READER) DEVI Use to check blood sugars continuously. 1 each 0   Continuous Glucose Sensor (FREESTYLE LIBRE 3 PLUS SENSOR) MISC Change sensor every 15 days. 3 each 3   glucose blood test strip Onetouch Delica test strips; UAD to monitor glucose bid;Dx:E11.9 100 each 12   Insulin Pen Needle (PEN NEEDLES) 32G X 4 MM MISC To use with insulin 100 each 2   Insulin Syringe-Needle U-100 (GNP INSULIN SYRINGES 31GX5/16") 31G X 5/16" 0.3 ML MISC To use with insulin 100 each 3   OZEMPIC, 1 MG/DOSE, 4 MG/3ML SOPN INJECT 1 MG UNDER THE SKIN ONCE A WEEK AS DIRECTED. 9 mL 1   simvastatin (ZOCOR) 20 MG tablet Take 1 tablet (20 mg total) by mouth at bedtime. 90 tablet 1   TYENNE 162 MG/0.9ML SOAJ Inject 162mg  Subcutaneous weekly for 30 days     No current facility-administered  medications on file prior to visit.    ALLERGIES: Allergies  Allergen Reactions   Morphine Itching    Hydrocodone is OK   Lisinopril Swelling    Numbness and swelling of lips    Topamax [Topiramate] Rash    FAMILY HISTORY: Family History  Problem Relation Age of Onset   Hypertension Mother    Early death Father        car accident   Diabetes Maternal Aunt    Renal Disease Maternal Aunt    Heart disease Maternal Grandmother    Heart disease Paternal Grandmother    Colon cancer Neg Hx    Stomach cancer Neg Hx    Pancreatic cancer Neg Hx    Esophageal cancer Neg Hx    Liver disease Neg Hx    Rectal cancer Neg Hx     SOCIAL HISTORY: Social History   Socioeconomic History   Marital status: Married    Spouse name: Leonette Most   Number of children: 0   Years  of education: Masters   Highest education level: Not on file  Occupational History   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: [employer]  Tobacco Use   Smoking status: Never   Smokeless tobacco: Never  Vaping Use   Vaping status: Never Used  Substance and Sexual Activity   Alcohol use: Yes    Comment: maybe 1 a month   Drug use: No   Sexual activity: Yes    Birth control/protection: Post-menopausal  Other Topics Concern   Not on file  Social History Narrative   12/07/19   From: the area   Living: with husband, Leonette Most   Work: Teacher, English as a foreign language at [employer]   Education: [employer] grad, [place] 2013      Family: Step son - Alline Pio       Enjoys: sing - band shows      Exercise: walking on the treadmill    Diet: not doing great with this, trying to cut back on carb intake      Safety   Seat belts: Yes    Guns: Yes  and secure   Safe in relationships: Yes     left handed    Social Drivers of Corporate investment banker Strain: Not on file  Food Insecurity: No Food Insecurity (12/31/2022)   Hunger Vital Sign    Worried About Running Out of Food in the Last Year: Never true    Ran Out of  Food in the Last Year: Never true  Transportation Needs: No Transportation Needs (12/31/2022)   PRAPARE - Administrator, Civil Service (Medical): No    Lack of Transportation (Non-Medical): No  Physical Activity: Not on file  Stress: Not on file  Social Connections: Not on file  Intimate Partner Violence: Not on file     PHYSICAL EXAM: Vitals:   10/11/23 1453  BP: 132/89  Pulse: 85  SpO2: 97%   General: No acute distress Head:  Normocephalic/atraumatic Skin/Extremities: No rash, no edema Neurological Exam: alert and awake. No aphasia or dysarthria. Fund of knowledge is appropriate.  Attention and concentration are normal.   Cranial nerves: Pupils equal, round. Extraocular movements intact with no nystagmus. Visual fields full.  No facial asymmetry.  Motor: Bulk and tone normal, muscle strength 5/5 throughout with no pronator drift.   Finger to nose testing intact.  Gait narrow-based and steady, able to tandem walk adequately.  Romberg negative.   IMPRESSION: This is a pleasant 63 yo RH woman with a history of diabetes, GERD who presented with dizziness that started in May 2017. The dizziness was initially vertiginous with spinning sensation, that resolved with vestibular therapy, however she continued to have a residual "floating" sensation when driving, with associated nausea. Exam and brain MRI normal. She underwent vestibular testing and an audiogram at [facility] with normal findings, no evidence of peripheral or central vestibular dysfunction, and symptoms were felt to be due to migraine-associated dizziness. A second opinion from [facility] indicated the possibility of BP-related symptoms. She was diagnosed with optic neuritis in 12/2022, MRI brain/orbits was normal. ESR and temporal artery biopsy were negative, however clinical symptoms were concerning for temporal arteritis, she continues follow-up with Rheumatology and Ophtho. She has been on amitriptyline 10mg  at bedtime  for migraine prophylaxis, refills sent. Follow-up in 1 year, call for any changes.   Thank you for allowing me to participate in her care.  Please do not hesitate to call for any questions or concerns.  Patrcia Dolly, M.D.   CC: Dr. [place]

## 2023-10-11 NOTE — Patient Instructions (Signed)
 Good to see doing better. Continue amitriptyline 10mg , you can adjust depending on how you are feeling. Continue follow-up with Ophthalmology and Rheumatology. Follow-up in 1 year, call for any changes.

## 2023-10-16 NOTE — Progress Notes (Unsigned)
   Rubin Payor, PhD, LAT, ATC acting as a scribe for Clementeen Graham, MD.  Linda [name] is a 63 y.o. female who presents to [facility] today for cont'd bilateral left worse than right hand pain. Hx of carpal tunnel, cubital tunnel, and synovial cyst removal surgery left arm Nov. 2023 though [facility]. Pt locates pain to through all fingers in her R hand. She is taking Tyenne and is wondering if that is causing her pain.  She notes continued paresthesias involving all of the fingers of the left hand. She does have some paresthesias in the right hand but that is less bad.  Radiates: yes Paresthesia: yes Grip strength: decreased Aggravates: unsure Treatments tried: wrist brace,   Pertinent review of systems: No fevers or chills  Relevant historical information: History of giant cell arteriolitis requiring oral steroids subsequently followed by monoclonal antibody against interleukin-6 Diabetes now much better controlled off of steroids.   Exam:  BP (!) 146/96   Pulse 90   Ht 5' (1.524 m)   Wt 211 lb (95.7 kg)   LMP 08/28/2015 (Approximate)   SpO2 97%   BMI 41.21 kg/m  General: Well Developed, well nourished, and in no acute distress.   MSK: Left arm normal-appearing nontender negative Tinel's intact strength. Right arm normal-appearing normal strength    Assessment and Plan: 63 y.o. female with left worse than right bilateral hand paresthesia and discomfort.  This occurs in the setting of extensive carpal tunnel and cubital tunnel surgery about a year and a half ago.  Additionally since then she has had giant cell arteriolitis and lots of steroids and increasing blood sugar.  It is possible resee in the aftereffects of her recovering nerve or nerve that was injured as a result of an inflammatory condition.  It also possible she has cervical radiculopathy or small fiber neuropathy.  Plan for nerve conduction study to get a better understanding of  the underlying cause of her paresthesias both upper extremities.  Additionally will check B12 and folic acid.  It is possible that she has low B12 causing some of the neuropathy she is experiencing.  She does have an established relationship with [facility] so we should be able to get her in there for the nerve study soon.  Recheck with me after we get the results of the nerve study back.   PDMP not reviewed this encounter. Orders Placed This Encounter  Procedures   B12 and Folate Panel    Standing Status:   Future    Number of Occurrences:   1    Expiration Date:   10/16/2024   Ambulatory referral to Neurology    Referral Priority:   Routine    Referral Type:   Consultation    Referral Reason:   Specialty Services Required    Requested Specialty:   Neurology    Number of Visits Requested:   1   NCV with EMG(electromyography)    Bilat hand Paresthesia Surgery hx    Standing Status:   Future    Expiration Date:   10/16/2024    Where should this test be performed?:   [facility]   No orders of the defined types were placed in this encounter.    Discussed warning signs or symptoms. Please see discharge instructions. Patient expresses understanding.   The above documentation has been reviewed and is accurate and complete Clementeen Graham, M.D.

## 2023-10-17 ENCOUNTER — Other Ambulatory Visit: Payer: Self-pay

## 2023-10-17 ENCOUNTER — Ambulatory Visit: Admitting: Family Medicine

## 2023-10-17 VITALS — BP 146/96 | HR 90 | Ht 60.0 in | Wt 211.0 lb

## 2023-10-17 DIAGNOSIS — E538 Deficiency of other specified B group vitamins: Secondary | ICD-10-CM | POA: Diagnosis not present

## 2023-10-17 DIAGNOSIS — R202 Paresthesia of skin: Secondary | ICD-10-CM | POA: Diagnosis not present

## 2023-10-17 LAB — B12 AND FOLATE PANEL
Folate: 14.8 ng/mL (ref >5.9)
Vitamin B-12: 387 pg/mL (ref 211–911)

## 2023-10-17 NOTE — Patient Instructions (Addendum)
 Thank you for coming in today.   I've ordered a nerve conduction study. You should hear about scheduling soon. Please let us know if you don't  Please get labs today before you leave   Check back after we get the test results.

## 2023-10-18 ENCOUNTER — Encounter: Payer: Self-pay | Admitting: Family Medicine

## 2023-10-18 NOTE — Progress Notes (Signed)
 B12 and folic acid labs are normal.

## 2023-10-24 DIAGNOSIS — R6884 Jaw pain: Secondary | ICD-10-CM | POA: Diagnosis not present

## 2023-10-24 DIAGNOSIS — R519 Headache, unspecified: Secondary | ICD-10-CM | POA: Diagnosis not present

## 2023-10-24 DIAGNOSIS — H5461 Unqualified visual loss, right eye, normal vision left eye: Secondary | ICD-10-CM | POA: Diagnosis not present

## 2023-10-24 DIAGNOSIS — M316 Other giant cell arteritis: Secondary | ICD-10-CM | POA: Diagnosis not present

## 2023-11-03 ENCOUNTER — Encounter: Payer: Self-pay | Admitting: Neurology

## 2023-11-12 ENCOUNTER — Encounter: Payer: Self-pay | Admitting: Family Medicine

## 2023-11-12 ENCOUNTER — Ambulatory Visit: Admitting: Neurology

## 2023-11-12 DIAGNOSIS — Z79899 Other long term (current) drug therapy: Secondary | ICD-10-CM | POA: Diagnosis not present

## 2023-11-12 DIAGNOSIS — R21 Rash and other nonspecific skin eruption: Secondary | ICD-10-CM | POA: Diagnosis not present

## 2023-11-12 DIAGNOSIS — R202 Paresthesia of skin: Secondary | ICD-10-CM

## 2023-11-12 DIAGNOSIS — M316 Other giant cell arteritis: Secondary | ICD-10-CM | POA: Diagnosis not present

## 2023-11-12 NOTE — Procedures (Signed)
 [facility]  [address] [place], [address]  [place], [place] [address] Tel: [phone] Fax: [phone] Test Date:  11/12/2023  Patient: Linda [name] DOB: [birth year] Physician: Jacquelyne Balint, MD  Sex: Female Height: 5\' 0"  Ref Phys: Clementeen Graham, MD  ID#: [id number]   Technician:    History: This is a 63 year old female with bilateral hand pain.  NCV & EMG Findings: Extensive electrodiagnostic evaluation of bilateral upper limbs shows: Right median-ulnar palmar sensory response shows abnormal peak latency difference ((Median Palm-Wrist)-(Ulnar Palm-Wrist), 0.52 ms). Left median-ulnar palmar, bilateral median, and bilateral ulnar sensory responses are within normal limits. Bilateral median (APB) and ulnar (ADM) motor responses are within normal limits. There is no evidence of active or chronic motor axon loss changes affecting any of the tested muscles on needle examination. Motor unit configuration and recruitment pattern is within normal limits.  Impression: This is an abnormal study. The findings are most consistent with the following: Evidence of a right median mononeuropathy at or distal to the wrist, consistent with carpal tunnel syndrome, very mild in degree electrically. No electrodiagnostic evidence of a left median mononeuropathy at or distal to the wrist (ie: carpal tunnel syndrome). No electrodiagnostic evidence of a right or left ulnar mononeuropathy. No electrodiagnostic evidence of a right or left cervical (C5-C8) motor radiculopathy.    ___________________________ Jacquelyne Balint, MD    Nerve Conduction Studies Motor Nerve Results    Latency Amplitude F-Lat Segment Distance CV Comment  Site (ms) Norm (mV) Norm (ms)  (cm) (m/s) Norm   Left Median (APB) Motor  Wrist 2.7  < 4.0 10.2  > 5.0        Elbow 6.9 - 10.2 -  Elbow-Wrist 24 57  > 50   Right Median (APB) Motor  Wrist 2.7  < 4.0 9.3  > 5.0        Elbow 6.7 - 9.0 -  Elbow-Wrist 23.5 59  > 50   Left  Ulnar (ADM) Motor  Wrist 1.68  < 3.1 12.4  > 7.0        Bel elbow 5.1 - 11.8 -  Bel elbow-Wrist 19 56  > 50   Ab elbow 6.5 - 11.2 -  Ab elbow-Bel elbow 10 71 -   Right Ulnar (ADM) Motor  Wrist 1.90  < 3.1 11.5  > 7.0        Bel elbow 5.2 - 10.9 -  Bel elbow-Wrist 18.5 56  > 50   Ab elbow 6.9 - 10.3 -  Ab elbow-Bel elbow 10 59 -    Sensory Sites    Neg Peak Lat Amplitude (O-P) Segment Distance Velocity Comment  Site (ms) Norm (V) Norm  (cm) (ms)   Left Median Sensory  Wrist-Dig II 3.2  < 3.8 34  > 10 Wrist-Dig II 13    Right Median Sensory  Wrist-Dig II 3.4  < 3.8 35  > 10 Wrist-Dig II 13    Left Median-Ulnar Palmar Sensory       Median  Palm-Wrist 2.2  < 2.2 41  > 10 Palm-Wrist 8         Ulnar  Palm-Wrist 1.88  < 2.2 25  > 5 Palm-Wrist 8    Right Median-Ulnar Palmar Sensory       Median  Palm-Wrist 2.2  < 2.2 39  > 10 Palm-Wrist 8         Ulnar  Palm-Wrist 1.68  < 2.2 27  > 5 Palm-Wrist 8  Left Ulnar Sensory  Wrist-Dig V 2.9  < 3.2 18  > 5 Wrist-Dig V 11    Right Ulnar Sensory  Wrist-Dig V 2.7  < 3.2 39  > 5 Wrist-Dig V 11     Inter-Nerve Comparisons   Nerve 1 Value 1 Nerve 2 Value 2 Parameter Result Normal  Sensory Sites  R Median Palm-Wrist 2.2 ms R Ulnar Palm-Wrist 1.68 ms Peak Lat Diff *0.52 ms <0.40  L Median Palm-Wrist 2.2 ms L Ulnar Palm-Wrist 1.88 ms Peak Lat Diff 0.32 ms <0.40   Electromyography   Side Muscle Ins.Act Fibs Fasc Recrt Amp Dur Poly Activation Comment  Right FDI Nml Nml Nml Nml Nml Nml Nml Nml N/A  Right EIP Nml Nml Nml Nml Nml Nml Nml Nml N/A  Right Pronator teres Nml Nml Nml Nml Nml Nml Nml Nml N/A  Right Biceps Nml Nml Nml Nml Nml Nml Nml Nml N/A  Right Triceps Nml Nml Nml Nml Nml Nml Nml Nml N/A  Right Deltoid Nml Nml Nml Nml Nml Nml Nml Nml N/A  Left FDI Nml Nml Nml Nml Nml Nml Nml Nml N/A  Left EIP Nml Nml Nml Nml Nml Nml Nml Nml N/A  Left Pronator teres Nml Nml Nml Nml Nml Nml Nml Nml N/A  Left Biceps Nml Nml Nml Nml Nml Nml Nml Nml  N/A  Left Triceps Nml Nml Nml Nml Nml Nml Nml Nml N/A  Left Deltoid Nml Nml Nml Nml Nml Nml Nml Nml N/A      Waveforms:  Motor           Sensory

## 2023-11-12 NOTE — Progress Notes (Signed)
 Nerve conduction study shows mild right carpal tunnel.  The left side looked okay.  No evidence of pinched nerves.  Recommend return to clinic to go over the results of this test and discussed treatment options and potential proceed with injection.

## 2023-12-07 ENCOUNTER — Other Ambulatory Visit: Payer: Self-pay | Admitting: Neurology

## 2023-12-07 DIAGNOSIS — G43109 Migraine with aura, not intractable, without status migrainosus: Secondary | ICD-10-CM

## 2023-12-27 ENCOUNTER — Other Ambulatory Visit: Payer: Self-pay | Admitting: Family Medicine

## 2023-12-27 DIAGNOSIS — M316 Other giant cell arteritis: Secondary | ICD-10-CM | POA: Diagnosis not present

## 2024-01-02 DIAGNOSIS — M2560 Stiffness of unspecified joint, not elsewhere classified: Secondary | ICD-10-CM | POA: Diagnosis not present

## 2024-01-02 DIAGNOSIS — Z79899 Other long term (current) drug therapy: Secondary | ICD-10-CM | POA: Diagnosis not present

## 2024-01-02 DIAGNOSIS — M316 Other giant cell arteritis: Secondary | ICD-10-CM | POA: Diagnosis not present

## 2024-01-02 DIAGNOSIS — M25569 Pain in unspecified knee: Secondary | ICD-10-CM | POA: Diagnosis not present

## 2024-03-16 ENCOUNTER — Other Ambulatory Visit: Payer: Self-pay | Admitting: Family Medicine

## 2024-03-16 DIAGNOSIS — E1169 Type 2 diabetes mellitus with other specified complication: Secondary | ICD-10-CM

## 2024-04-02 DIAGNOSIS — M316 Other giant cell arteritis: Secondary | ICD-10-CM | POA: Diagnosis not present

## 2024-04-02 DIAGNOSIS — M2560 Stiffness of unspecified joint, not elsewhere classified: Secondary | ICD-10-CM | POA: Diagnosis not present

## 2024-04-02 DIAGNOSIS — R6884 Jaw pain: Secondary | ICD-10-CM | POA: Diagnosis not present

## 2024-04-02 DIAGNOSIS — Z79899 Other long term (current) drug therapy: Secondary | ICD-10-CM | POA: Diagnosis not present

## 2024-04-03 ENCOUNTER — Ambulatory Visit: Payer: BC Managed Care – PPO | Admitting: Family Medicine

## 2024-04-15 ENCOUNTER — Encounter: Payer: Self-pay | Admitting: Family Medicine

## 2024-04-15 ENCOUNTER — Ambulatory Visit (INDEPENDENT_AMBULATORY_CARE_PROVIDER_SITE_OTHER): Admitting: Family Medicine

## 2024-04-15 VITALS — BP 118/80 | HR 82 | Resp 16 | Ht 60.0 in | Wt 205.1 lb

## 2024-04-15 DIAGNOSIS — Z5181 Encounter for therapeutic drug level monitoring: Secondary | ICD-10-CM

## 2024-04-15 DIAGNOSIS — E785 Hyperlipidemia, unspecified: Secondary | ICD-10-CM

## 2024-04-15 DIAGNOSIS — Z111 Encounter for screening for respiratory tuberculosis: Secondary | ICD-10-CM

## 2024-04-15 DIAGNOSIS — Z0000 Encounter for general adult medical examination without abnormal findings: Secondary | ICD-10-CM

## 2024-04-15 DIAGNOSIS — E1159 Type 2 diabetes mellitus with other circulatory complications: Secondary | ICD-10-CM | POA: Diagnosis not present

## 2024-04-15 DIAGNOSIS — I152 Hypertension secondary to endocrine disorders: Secondary | ICD-10-CM | POA: Diagnosis not present

## 2024-04-15 DIAGNOSIS — Z0001 Encounter for general adult medical examination with abnormal findings: Secondary | ICD-10-CM | POA: Diagnosis not present

## 2024-04-15 DIAGNOSIS — E1169 Type 2 diabetes mellitus with other specified complication: Secondary | ICD-10-CM | POA: Diagnosis not present

## 2024-04-15 DIAGNOSIS — M316 Other giant cell arteritis: Secondary | ICD-10-CM

## 2024-04-15 DIAGNOSIS — Z7985 Long-term (current) use of injectable non-insulin antidiabetic drugs: Secondary | ICD-10-CM

## 2024-04-15 LAB — CBC WITH DIFFERENTIAL/PLATELET
Basophils Absolute: 0 K/uL (ref 0.0–0.1)
Basophils Relative: 0.4 % (ref 0.0–3.0)
Eosinophils Absolute: 0.1 K/uL (ref 0.0–0.7)
Eosinophils Relative: 1.3 % (ref 0.0–5.0)
HCT: 40.1 % (ref 36.0–46.0)
Hemoglobin: 13.2 g/dL (ref 12.0–15.0)
Lymphocytes Relative: 28 % (ref 12.0–46.0)
Lymphs Abs: 1.6 K/uL (ref 0.7–4.0)
MCHC: 33 g/dL (ref 30.0–36.0)
MCV: 82.7 fl (ref 78.0–100.0)
Monocytes Absolute: 0.4 K/uL (ref 0.1–1.0)
Monocytes Relative: 6.8 % (ref 3.0–12.0)
Neutro Abs: 3.7 K/uL (ref 1.4–7.7)
Neutrophils Relative %: 63.5 % (ref 43.0–77.0)
Platelets: 230 K/uL (ref 150.0–400.0)
RBC: 4.85 Mil/uL (ref 3.87–5.11)
RDW: 13.1 % (ref 11.5–15.5)
WBC: 5.9 K/uL (ref 4.0–10.5)

## 2024-04-15 LAB — COMPREHENSIVE METABOLIC PANEL WITH GFR
ALT: 39 U/L — ABNORMAL HIGH (ref 0–35)
AST: 27 U/L (ref 0–37)
Albumin: 4.4 g/dL (ref 3.5–5.2)
Alkaline Phosphatase: 83 U/L (ref 39–117)
BUN: 16 mg/dL (ref 6–23)
CO2: 28 meq/L (ref 19–32)
Calcium: 9 mg/dL (ref 8.4–10.5)
Chloride: 105 meq/L (ref 96–112)
Creatinine, Ser: 0.72 mg/dL (ref 0.40–1.20)
GFR: 90.45 mL/min (ref >60.00)
Glucose, Bld: 101 mg/dL — ABNORMAL HIGH (ref 70–99)
Potassium: 3.8 meq/L (ref 3.5–5.1)
Sodium: 140 meq/L (ref 135–145)
Total Bilirubin: 1.1 mg/dL (ref 0.2–1.2)
Total Protein: 7.2 g/dL (ref 6.0–8.3)

## 2024-04-15 LAB — LIPID PANEL
Cholesterol: 231 mg/dL — ABNORMAL HIGH (ref 0–200)
HDL: 83.1 mg/dL (ref >39.00)
LDL Cholesterol: 134 mg/dL — ABNORMAL HIGH (ref 0–99)
NonHDL: 147.71
Total CHOL/HDL Ratio: 3
Triglycerides: 69 mg/dL (ref 0.0–149.0)
VLDL: 13.8 mg/dL (ref 0.0–40.0)

## 2024-04-15 LAB — HEMOGLOBIN A1C: Hgb A1c MFr Bld: 6.8 % — ABNORMAL HIGH (ref 4.6–6.5)

## 2024-04-15 NOTE — Patient Instructions (Addendum)
 A few things to remember from today's visit:  Routine general medical examination at a health care facility  Type 2 diabetes mellitus with other specified complication, without long-term current use of insulin  (HCC) - Plan: Comprehensive metabolic panel with GFR, Microalbumin / creatinine urine ratio, Hemoglobin A1c  Hyperlipidemia, unspecified hyperlipidemia type - Plan: Comprehensive metabolic panel with GFR, Lipid panel  Hypertension associated with diabetes (HCC) - Plan: Comprehensive metabolic panel with GFR  Temporal arteritis (HCC) - Plan: Hepatitis Panel (REFL), CBC with Differential/Platelet  Encounter for medication monitoring  Screening-pulmonary TB - Plan: QuantiFERON-TB Gold Plus  If you need refills for medications you take chronically, please call your pharmacy. Do not use My Chart to request refills or for acute issues that need immediate attention. If you send a my chart message, it may take a few days to be addressed, specially if I am not in the office.  Please be sure medication list is accurate. If a new problem present, please set up appointment sooner than planned today.  Health Maintenance, Female Adopting a healthy lifestyle and getting preventive care are important in promoting health and wellness. Ask your health care provider about: The right schedule for you to have regular tests and exams. Things you can do on your own to prevent diseases and keep yourself healthy. What should I know about diet, weight, and exercise? Eat a healthy diet  Eat a diet that includes plenty of vegetables, fruits, low-fat dairy products, and lean protein. Do not eat a lot of foods that are high in solid fats, added sugars, or sodium. Maintain a healthy weight Body mass index (BMI) is used to identify weight problems. It estimates body fat based on height and weight. Your health care provider can help determine your BMI and help you achieve or maintain a healthy weight. Get  regular exercise Get regular exercise. This is one of the most important things you can do for your health. Most adults should: Exercise for at least 150 minutes each week. The exercise should increase your heart rate and make you sweat (moderate-intensity exercise). Do strengthening exercises at least twice a week. This is in addition to the moderate-intensity exercise. Spend less time sitting. Even light physical activity can be beneficial. Watch cholesterol and blood lipids Have your blood tested for lipids and cholesterol at 61 years of age, then have this test every 5 years. Have your cholesterol levels checked more often if: Your lipid or cholesterol levels are high. You are older than 61 years of age. You are at high risk for heart disease. What should I know about cancer screening? Depending on your health history and family history, you may need to have cancer screening at various ages. This may include screening for: Breast cancer. Cervical cancer. Colorectal cancer. Skin cancer. Lung cancer. What should I know about heart disease, diabetes, and high blood pressure? Blood pressure and heart disease High blood pressure causes heart disease and increases the risk of stroke. This is more likely to develop in people who have high blood pressure readings or are overweight. Have your blood pressure checked: Every 3-5 years if you are 41-40 years of age. Every year if you are 81 years old or older. Diabetes Have regular diabetes screenings. This checks your fasting blood sugar level. Have the screening done: Once every three years after age 12 if you are at a normal weight and have a low risk for diabetes. More often and at a younger age if you are overweight or  have a high risk for diabetes. What should I know about preventing infection? Hepatitis B If you have a higher risk for hepatitis B, you should be screened for this virus. Talk with your health care provider to find out if  you are at risk for hepatitis B infection. Hepatitis C Testing is recommended for: Everyone born from [birth year] through 1963. Anyone with known risk factors for hepatitis C. Sexually transmitted infections (STIs) Get screened for STIs, including gonorrhea and chlamydia, if: You are sexually active and are younger than 61 years of age. You are older than 61 years of age and your health care provider tells you that you are at risk for this type of infection. Your sexual activity has changed since you were last screened, and you are at increased risk for chlamydia or gonorrhea. Ask your health care provider if you are at risk. Ask your health care provider about whether you are at high risk for HIV. Your health care provider may recommend a prescription medicine to help prevent HIV infection. If you choose to take medicine to prevent HIV, you should first get tested for HIV. You should then be tested every 3 months for as long as you are taking the medicine. Pregnancy If you are about to stop having your period (premenopausal) and you may become pregnant, seek counseling before you get pregnant. Take 400 to 800 micrograms (mcg) of folic acid  every day if you become pregnant. Ask for birth control (contraception) if you want to prevent pregnancy. Osteoporosis and menopause Osteoporosis is a disease in which the bones lose minerals and strength with aging. This can result in bone fractures. If you are 60 years old or older, or if you are at risk for osteoporosis and fractures, ask your health care provider if you should: Be screened for bone loss. Take a calcium  or vitamin D supplement to lower your risk of fractures. Be given hormone replacement therapy (HRT) to treat symptoms of menopause. Follow these instructions at home: Alcohol use Do not drink alcohol if: Your health care provider tells you not to drink. You are pregnant, may be pregnant, or are planning to become pregnant. If you drink  alcohol: Limit how much you have to: 0-1 drink a day. Know how much alcohol is in your drink. In the [place] one drink equals one 12 oz bottle of beer (355 mL), one 5 oz glass of wine (148 mL), or one 1 oz glass of hard liquor (44 mL). Lifestyle Do not use any products that contain nicotine or tobacco. These products include cigarettes, chewing tobacco, and vaping devices, such as e-cigarettes. If you need help quitting, ask your health care provider. Do not use street drugs. Do not share needles. Ask your health care provider for help if you need support or information about quitting drugs. General instructions Schedule regular health, dental, and eye exams. Stay current with your vaccines. Tell your health care provider if: You often feel depressed. You have ever been abused or do not feel safe at home. Summary Adopting a healthy lifestyle and getting preventive care are important in promoting health and wellness. Follow your health care provider's instructions about healthy diet, exercising, and getting tested or screened for diseases. Follow your health care provider's instructions on monitoring your cholesterol and blood pressure. This information is not intended to replace advice given to you by your health care provider. Make sure you discuss any questions you have with your health care provider. Document Revised: 12/19/2020 Document Reviewed: 12/19/2020  Elsevier Patient Education  2024 [employer].

## 2024-04-15 NOTE — Assessment & Plan Note (Signed)
 Following with rheumatology regularly. Labs requested by provider added to today's blood work (TB screening, CBC with differential, and hepatitis panel).

## 2024-04-15 NOTE — Assessment & Plan Note (Signed)
 BP adequately controlled on nonpharmacologic treatment. She has an eye exam for 05/2024. Continue low-salt diet.

## 2024-04-15 NOTE — Assessment & Plan Note (Signed)
 Problem is adequately controlled, last hemoglobin A1c 6.4 in 09/2023. Continue Ozempic  1 mg weekly. Annual eye exam, periodic dental and foot care to continue. F/U in 5-6 months.
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
## 2024-04-15 NOTE — Assessment & Plan Note (Signed)
 We discussed the importance of regular physical activity and healthy diet for prevention of chronic illness and/or complications. Preventive guidelines reviewed. Vaccination: She prefers to hold on flu and Prevnar 20 vaccination until she sees rheumatologist. Ca++ and vit D supplementation recommended. Continue her female preventive care with gynecologist. Next CPE in a year.

## 2024-04-15 NOTE — Assessment & Plan Note (Signed)
 Continue simvastatin  20 mg daily and low-fat diet. Further recommendation will be given according to lipid panel result.

## 2024-04-16 ENCOUNTER — Ambulatory Visit: Payer: Self-pay | Admitting: Family Medicine

## 2024-04-16 LAB — MICROALBUMIN / CREATININE URINE RATIO
Creatinine,U: 145.6 mg/dL
Microalb Creat Ratio: 57.7 mg/g — ABNORMAL HIGH (ref 0.0–30.0)
Microalb, Ur: 8.4 mg/dL — ABNORMAL HIGH (ref 0.0–1.9)

## 2024-04-17 LAB — QUANTIFERON-TB GOLD PLUS
Mitogen-NIL: 6.45 [IU]/mL
NIL: 0.01 [IU]/mL
QuantiFERON-TB Gold Plus: NEGATIVE
TB1-NIL: 0 [IU]/mL
TB2-NIL: 0 [IU]/mL

## 2024-04-21 ENCOUNTER — Other Ambulatory Visit: Payer: Self-pay | Admitting: Family Medicine

## 2024-05-05 ENCOUNTER — Other Ambulatory Visit: Payer: Self-pay | Admitting: Family Medicine

## 2024-05-05 MED ORDER — FREESTYLE LIBRE 3 PLUS SENSOR MISC: 3 refills | Status: AC

## 2024-05-14 ENCOUNTER — Other Ambulatory Visit: Payer: Self-pay | Admitting: Family Medicine

## 2024-05-14 DIAGNOSIS — Z0000 Encounter for general adult medical examination without abnormal findings: Secondary | ICD-10-CM

## 2024-05-20 ENCOUNTER — Emergency Department (HOSPITAL_BASED_OUTPATIENT_CLINIC_OR_DEPARTMENT_OTHER)
Admission: EM | Admit: 2024-05-20 | Discharge: 2024-05-20 | Disposition: A | Discharge status: Home / Self Care | Attending: Emergency Medicine | Admitting: Emergency Medicine

## 2024-05-20 ENCOUNTER — Emergency Department (HOSPITAL_BASED_OUTPATIENT_CLINIC_OR_DEPARTMENT_OTHER)

## 2024-05-20 ENCOUNTER — Telehealth: Payer: Self-pay | Admitting: Family Medicine

## 2024-05-20 ENCOUNTER — Other Ambulatory Visit: Payer: Self-pay

## 2024-05-20 ENCOUNTER — Ambulatory Visit
Admission: EM | Admit: 2024-05-20 | Discharge: 2024-05-20 | Disposition: A | Discharge status: Home / Self Care | Attending: Family Medicine | Admitting: Family Medicine

## 2024-05-20 DIAGNOSIS — D259 Leiomyoma of uterus, unspecified: Secondary | ICD-10-CM | POA: Diagnosis not present

## 2024-05-20 DIAGNOSIS — R1012 Left upper quadrant pain: Secondary | ICD-10-CM | POA: Diagnosis not present

## 2024-05-20 DIAGNOSIS — K449 Diaphragmatic hernia without obstruction or gangrene: Secondary | ICD-10-CM | POA: Diagnosis not present

## 2024-05-20 DIAGNOSIS — R109 Unspecified abdominal pain: Secondary | ICD-10-CM

## 2024-05-20 DIAGNOSIS — K769 Liver disease, unspecified: Secondary | ICD-10-CM | POA: Diagnosis not present

## 2024-05-20 DIAGNOSIS — I10 Essential (primary) hypertension: Secondary | ICD-10-CM | POA: Insufficient documentation

## 2024-05-20 DIAGNOSIS — E119 Type 2 diabetes mellitus without complications: Secondary | ICD-10-CM | POA: Insufficient documentation

## 2024-05-20 DIAGNOSIS — Z794 Long term (current) use of insulin: Secondary | ICD-10-CM | POA: Insufficient documentation

## 2024-05-20 DIAGNOSIS — R1032 Left lower quadrant pain: Secondary | ICD-10-CM | POA: Diagnosis not present

## 2024-05-20 LAB — URINALYSIS, ROUTINE W REFLEX MICROSCOPIC
Bilirubin Urine: NEGATIVE
Glucose, UA: NEGATIVE mg/dL
Hgb urine dipstick: NEGATIVE
Nitrite: NEGATIVE
Specific Gravity, Urine: 1.029 (ref 1.005–1.030)
WBC, UA: >50 WBC/hpf (ref 0–5)
pH: 5.5 (ref 5.0–8.0)

## 2024-05-20 LAB — POCT URINE DIPSTICK
Bilirubin, UA: NEGATIVE
Blood, UA: NEGATIVE
Glucose, UA: NEGATIVE mg/dL
Ketones, POC UA: NEGATIVE mg/dL
Nitrite, UA: NEGATIVE
Spec Grav, UA: >=1.03 — AB (ref 1.010–1.025)
Urobilinogen, UA: 1 U/dL
pH, UA: 5.5 (ref 5.0–8.0)

## 2024-05-20 LAB — CBC
HCT: 40.4 % (ref 36.0–46.0)
Hemoglobin: 13.6 g/dL (ref 12.0–15.0)
MCH: 27.8 pg (ref 26.0–34.0)
MCHC: 33.7 g/dL (ref 30.0–36.0)
MCV: 82.4 fL (ref 80.0–100.0)
Platelets: 250 K/uL (ref 150–400)
RBC: 4.9 MIL/uL (ref 3.87–5.11)
RDW: 13 % (ref 11.5–15.5)
WBC: 6.9 K/uL (ref 4.0–10.5)
nRBC: 0 % (ref 0.0–0.2)

## 2024-05-20 LAB — COMPREHENSIVE METABOLIC PANEL WITH GFR
ALT: 41 U/L (ref 0–44)
AST: 33 U/L (ref 15–41)
Albumin: 4.5 g/dL (ref 3.5–5.0)
Alkaline Phosphatase: 103 U/L (ref 38–126)
Anion gap: 14 (ref 5–15)
BUN: 17 mg/dL (ref 8–23)
CO2: 24 mmol/L (ref 22–32)
Calcium: 9.5 mg/dL (ref 8.9–10.3)
Chloride: 104 mmol/L (ref 98–111)
Creatinine, Ser: 0.92 mg/dL (ref 0.44–1.00)
GFR, Estimated: >60 mL/min (ref >60)
Glucose, Bld: 110 mg/dL — ABNORMAL HIGH (ref 70–99)
Potassium: 3.6 mmol/L (ref 3.5–5.1)
Sodium: 142 mmol/L (ref 135–145)
Total Bilirubin: 1.4 mg/dL — ABNORMAL HIGH (ref 0.0–1.2)
Total Protein: 7.1 g/dL (ref 6.5–8.1)

## 2024-05-20 LAB — LIPASE, BLOOD: Lipase: 22 U/L (ref 11–51)

## 2024-05-20 MED ORDER — IOHEXOL 300 MG/ML  SOLN
100.0000 mL | Freq: Once | INTRAMUSCULAR | Status: AC | PRN
  Administered 2024-05-20: 100 mL via INTRAVENOUS

## 2024-05-20 NOTE — Discharge Instructions (Signed)
 Thank you for letting us  evaluate you today.  Your CT scan did show a 7 mm hyperenhancement on the liver.  We recommend you to get a elective/routine/nonemergent MRI of your liver to characterize what this lesion is.  Please contact your PCP for setting this up.  Otherwise, all your lab work is within normal limits.  Your urine was contaminated and showed a little bit of white blood cells but as you are not having symptoms, we will not treat for UTI at this time.  I will send off a urine culture to see if it grows anything.  If it is positive for bacteria, we will call you and put you on an antibiotic for UTI.  Return to Emergency Department for experiencing if new or worsening abdominal pain, intractable vomiting

## 2024-05-20 NOTE — Discharge Instructions (Addendum)
 Please go to the ER for further evaluation of your abdominal pain

## 2024-05-20 NOTE — ED Triage Notes (Signed)
 Pt present with lt lower abdominal pain x this morning. Pt states she feels pain when laying on that side. Pt denies urinary symptoms. Pt denies vaginal and rectal bleeding. Pt denies nausea and vomiting.

## 2024-05-20 NOTE — ED Provider Notes (Signed)
 [facility]    CSN: [id number] Arrival date & time: 05/20/24  1555      History   Chief Complaint Chief Complaint  Patient presents with   Abdominal Pain    HPI Linda [name] is a 63 y.o. female presents for abdominal pain.  Patient reports last night she developed a persistent nonradiating left mid to lower abdominal pain that she states feels like a pulling sensation.  Denies any bloating, nausea/vomiting/diarrhea, dysuria.  No history of GI diagnoses such as Crohn's, IBS, colitis, diverticulitis.  Does have remote history of ovarian cyst.  Denies any abdominal surgeries.  She believes her last bowel movement was 2 days ago.  She has not tried any OTC treatments for symptoms.  No other concerns at this time.   Abdominal Pain   Past Medical History:  Diagnosis Date   Allergic rhinitis    Allergy    SEASONAL- pt states she has not had issues in years   Anemia    Cardiac murmur    1/6 SEM at [facility] (old finding per patient)- no heart murmur per Dr. Jenetta   Carpal tunnel syndrome 2023   Cubital tunnel syndrome 2023   Diabetes mellitus without complication (HCC)    GERD (gastroesophageal reflux disease)    History of hiatal hernia    Hyperlipidemia    Hypertension    Neuralgic migraines    silent migraines   Obesity    Vertigo     Patient Active Problem List   Diagnosis Date Noted   Chronic pain of both knees 10/04/2023   Temporal arteritis (HCC) 06/03/2023   Jaw claudication 01/01/2023   Hiatal hernia 08/24/2020   Anemia 07/26/2020   Hypertension associated with diabetes (HCC) 12/07/2019   Acute pain of right shoulder 12/07/2019   Spasm of cervical paraspinous muscle 08/06/2019   Colon polyp 03/20/2019   Headache disorder 07/30/2018   HLD (hyperlipidemia) 05/07/2018   Vertiginous migraine 01/25/2017   Vestibular migraine 06/12/2016   BPV (benign positional vertigo) 05/08/2016   Dizziness 01/16/2016   Esophageal reflux 04/21/2014   Dyspepsia  03/31/2014   Class 2 obesity with body mass index (BMI) of 35.0 to 35.9 in adult 01/13/2013   Cardiac murmur 01/04/2012   Routine general medical examination at a health care facility 11/20/2011   Type 2 diabetes mellitus with other specified complication (HCC) 10/15/2011   GERD with esophagitis 10/09/2011   Allergic rhinitis, cause unspecified 12/19/2006    Past Surgical History:  Procedure Laterality Date   ARTERY BIOPSY Right 01/02/2023   Procedure: RIGHT TEMPORAL ARTERY BIOPSY;  Surgeon: Gretta Lonni PARAS, MD;  Location: [facility] OR;  Service: Vascular;  Laterality: Right;   COLONOSCOPY     CYST EXCISION N/A 02/09/2020   Procedure: EXCISION UPPER BACK CYST;  Surgeon: Vernetta Berg, MD;  Location: [facility];  Service: General;  Laterality: N/A;   fibroid ablation     skin graft for burn     trigger thumb     UPPER GASTROINTESTINAL ENDOSCOPY      OB History   No obstetric history on file.      Home Medications    Prior to Admission medications   Medication Sig Start Date End Date Taking? Authorizing Provider  amitriptyline  (ELAVIL ) 10 MG tablet TAKE 2 TABLETS BY MOUTH EVERY NIGHT 12/09/23   Georjean Darice HERO, MD  Cal Carb-Mag Hydrox-Simeth (MYLANTA COAT & COOL) 1200-270-80 MG/10ML SUSP Take 15 mLs by mouth daily as needed (coat stomach).  [provider]  Continuous Glucose Receiver (FREESTYLE LIBRE 3 READER) DEVI Use to check blood sugars continuously. 04/03/23   [place], Betty G, MD  Continuous Glucose Sensor (FREESTYLE LIBRE 3 PLUS SENSOR) MISC Change sensor every 15 days. 05/05/24   [place], Betty G, MD  glucose blood test strip [facility] test strips; UAD to monitor glucose bid;Dx:E11.9 03/26/19   Aron, Talia M, MD  Insulin  Pen Needle (PEN NEEDLES) 32G X 4 MM MISC To use with insulin  03/26/23   [place], Betty G, MD  Insulin  Syringe-Needle U-100 [facility] INSULIN  SYRINGES 31GX5/16) 31G X 5/16 0.3 ML MISC To use with insulin  03/06/23   [place], Betty G, MD   OZEMPIC , 1 MG/DOSE, 4 MG/3ML SOPN INJECT 1 MG UNDER THE SKIN ONCE A WEEK AS DIRECTED. 03/16/24   [place], Betty G, MD  simvastatin  (ZOCOR ) 20 MG tablet Take 1 tablet (20 mg total) by mouth at bedtime. 09/27/23   [place], Betty G, MD  TYENNE 162 MG/0.9ML SOAJ Inject 162mg  Subcutaneous weekly for 30 days 09/24/23   [provider]    Family History Family History  Problem Relation Age of Onset   Hypertension Mother    Early death Father        car accident   Diabetes Maternal Aunt    Renal Disease Maternal Aunt    Heart disease Maternal Grandmother    Heart disease Paternal Grandmother    Colon cancer Neg Hx    Stomach cancer Neg Hx    Pancreatic cancer Neg Hx    Esophageal cancer Neg Hx    Liver disease Neg Hx    Rectal cancer Neg Hx     Social History Social History   Tobacco Use   Smoking status: Never   Smokeless tobacco: Never  Vaping Use   Vaping status: Never Used  Substance Use Topics   Alcohol use: Yes    Comment: maybe 1 a month   Drug use: No     Allergies   Morphine, Lisinopril , and Topamax  [topiramate ]   Review of Systems Review of Systems  Gastrointestinal:  Positive for abdominal pain.     Physical Exam Triage Vital Signs ED Triage Vitals  Encounter Vitals Group     BP 05/20/24 1631 (!) 154/89     Girls Systolic BP Percentile --      Girls Diastolic BP Percentile --      Boys Systolic BP Percentile --      Boys Diastolic BP Percentile --      Pulse Rate 05/20/24 1631 74     Resp 05/20/24 1631 15     Temp 05/20/24 1631 97.6 F (36.4 C)     Temp Source 05/20/24 1631 Oral     SpO2 05/20/24 1631 97 %     Weight --      Height --      Head Circumference --      Peak Flow --      Pain Score 05/20/24 1630 5     Pain Loc --      Pain Education --      Exclude from Growth Chart --    No data found.  Updated Vital Signs BP (!) 154/89   Pulse 74   Temp 97.6 F (36.4 C) (Oral)   Resp 15   LMP 08/28/2015 (Approximate)   SpO2 97%    Visual Acuity Right Eye Distance:   Left Eye Distance:   Bilateral Distance:    Right Eye Near:  Left Eye Near:    Bilateral Near:     Physical Exam Vitals and nursing note reviewed.  Constitutional:      General: She is not in acute distress.    Appearance: Normal appearance. She is not ill-appearing.  HENT:     Head: Normocephalic and atraumatic.  Eyes:     Pupils: Pupils are equal, round, and reactive to light.  Cardiovascular:     Rate and Rhythm: Normal rate.  Pulmonary:     Effort: Pulmonary effort is normal.  Abdominal:     General: Bowel sounds are normal.     Palpations: Abdomen is soft. There is no hepatomegaly or splenomegaly.     Tenderness: There is abdominal tenderness in the left upper quadrant and left lower quadrant. Negative signs include Rovsing's sign and McBurney's sign.   Skin:    General: Skin is warm and dry.  Neurological:     General: No focal deficit present.     Mental Status: She is alert and oriented to person, place, and time.  Psychiatric:        Mood and Affect: Mood normal.        Behavior: Behavior normal.      UC Treatments / Results  Labs (all labs ordered are listed, but only abnormal results are displayed) Labs Reviewed  POCT URINE DIPSTICK - Abnormal; Notable for the following components:      Result Value   Spec Grav, UA >=1.030 (*)    Leukocytes, UA Trace (*)    All other components within normal limits    EKG   Radiology No results found.  Procedures Procedures (including critical care time)  Medications Ordered in UC Medications - No data to display  Initial Impression / Assessment and Plan / UC Course  I have reviewed the triage vital signs and the nursing notes.  Pertinent labs & imaging results that were available during my care of the patient were reviewed by me and considered in my medical decision making (see chart for details).     I reviewed exam and symptoms with patient.  Discussed  limitations and abilities of urgent care.  Urine with trace leuks otherwise no s/s of infection. Discussed with patient abdominal x-ray to rule out constipation but she declines.  Patient very concerned about the symptoms and wants to get checked out further.  Advised she go to the emergency room where they have other imaging and blood work that can be done.  She is in agreement plan will go POV to the ER Final Clinical Impressions(s) / UC Diagnoses   Final diagnoses:  Abdominal pain, unspecified abdominal location     Discharge Instructions      Please go to the ER for further evaluation of your abdominal pain    ED Prescriptions   None    PDMP not reviewed this encounter.   Loreda Myla SAUNDERS, NP 05/20/24 1755

## 2024-05-20 NOTE — ED Provider Notes (Signed)
 [place] EMERGENCY DEPARTMENT AT [facility] Provider Note   CSN: [id number] Arrival date & time: 05/20/24  1807     Patient presents with: Abdominal Pain   Linda [name] is a 63 y.o. female with a past medical history of T2DM, cardiac murmur, BPV, HLD, HTN, temporal arteritis presents to Emergency Department for evaluation of left-sided abdominal pain that started this morning.  Pain is described as a pulling sensation.  Pain does not worsen following eating.  Last BM was 2 days ago.  Has not tried any OTC treatments for symptoms.  No abdominal surgeries.  Has been on Ozempic  for years and the same dose for the past few months.  Denies NVD, fever     Abdominal Pain      Prior to Admission medications   Medication Sig Start Date End Date Taking? Authorizing Provider  amitriptyline  (ELAVIL ) 10 MG tablet TAKE 2 TABLETS BY MOUTH EVERY NIGHT 12/09/23   Georjean Darice HERO, MD  Cal Carb-Mag Hydrox-Simeth (MYLANTA COAT & COOL) 1200-270-80 MG/10ML SUSP Take 15 mLs by mouth daily as needed (coat stomach).    [provider]  Continuous Glucose Receiver (FREESTYLE LIBRE 3 READER) DEVI Use to check blood sugars continuously. 04/03/23   [place], Betty G, MD  Continuous Glucose Sensor (FREESTYLE LIBRE 3 PLUS SENSOR) MISC Change sensor every 15 days. 05/05/24   [place], Betty G, MD  glucose blood test strip [facility] test strips; UAD to monitor glucose bid;Dx:E11.9 03/26/19   Aron, Talia M, MD  Insulin  Pen Needle (PEN NEEDLES) 32G X 4 MM MISC To use with insulin  03/26/23   [place], Betty G, MD  Insulin  Syringe-Needle U-100 ([facility] INSULIN  SYRINGES 31GX5/16) 31G X 5/16 0.3 ML MISC To use with insulin  03/06/23   [place], Betty G, MD  OZEMPIC , 1 MG/DOSE, 4 MG/3ML SOPN INJECT 1 MG UNDER THE SKIN ONCE A WEEK AS DIRECTED. 03/16/24   [place], Betty G, MD  simvastatin  (ZOCOR ) 20 MG tablet Take 1 tablet (20 mg total) by mouth at bedtime. 09/27/23   [place], Betty G, MD  TYENNE 162 MG/0.9ML SOAJ  Inject 162mg  Subcutaneous weekly for 30 days 09/24/23   [provider]    Allergies: Morphine, Lisinopril , and Topamax  [topiramate ]    Review of Systems  Gastrointestinal:  Positive for abdominal pain.    Updated Vital Signs BP 134/84   Pulse 66   Temp 97.9 F (36.6 C) (Oral)   Resp 18   LMP 08/28/2015 (Approximate)   SpO2 100%   Physical Exam Vitals and nursing note reviewed.  Constitutional:      General: She is not in acute distress.    Appearance: Normal appearance.  HENT:     Head: Normocephalic and atraumatic.  Eyes:     Conjunctiva/sclera: Conjunctivae normal.  Cardiovascular:     Rate and Rhythm: Normal rate.  Pulmonary:     Effort: Pulmonary effort is normal. No respiratory distress.  Abdominal:     Tenderness: There is abdominal tenderness in the left upper quadrant and left lower quadrant. There is no guarding or rebound.  Skin:    Coloration: Skin is not jaundiced or pale.  Neurological:     Mental Status: She is alert. Mental status is at baseline.     (all labs ordered are listed, but only abnormal results are displayed) Labs Reviewed  URINALYSIS, ROUTINE W REFLEX MICROSCOPIC - Abnormal; Notable for the following components:      Result Value   Ketones, ur TRACE (*)  Protein, ur TRACE (*)    Leukocytes,Ua LARGE (*)    Bacteria, UA FEW (*)    All other components within normal limits  COMPREHENSIVE METABOLIC PANEL WITH GFR - Abnormal; Notable for the following components:   Glucose, Bld 110 (*)    Total Bilirubin 1.4 (*)    All other components within normal limits  URINE CULTURE  CBC  LIPASE, BLOOD    EKG: None  Radiology: CT ABDOMEN PELVIS W CONTRAST Result Date: 05/20/2024 CLINICAL DATA:  Left lower quadrant abdominal pain. History of giant cell arteritis. EXAM: CT ABDOMEN AND PELVIS WITH CONTRAST TECHNIQUE: Multidetector CT imaging of the abdomen and pelvis was performed using the standard protocol following bolus  administration of intravenous contrast. RADIATION DOSE REDUCTION: This exam was performed according to the departmental dose-optimization program which includes automated exposure control, adjustment of the mA and/or kV according to patient size and/or use of iterative reconstruction technique. CONTRAST:  100mL OMNIPAQUE IOHEXOL 300 MG/ML  SOLN COMPARISON:  Ultrasound abdomen 07/29/2020. CT abdomen pelvis 10/31/2007 FINDINGS: Lower chest: Lung bases are clear. Moderate esophageal hiatal hernia. Hepatobiliary: Focal 7 mm hyperenhancing lesion in the dome of the liver, not seen on prior study. This is nonspecific and could represent vascular malformation or focal lesion. Recommend follow-up with elective MRI for further evaluation. Gallbladder and bile ducts are normal. Pancreas: Unremarkable. No pancreatic ductal dilatation or surrounding inflammatory changes. Spleen: Normal in size without focal abnormality. Adrenals/Urinary Tract: Adrenal glands are unremarkable. Kidneys are normal, without renal calculi, focal lesion, or hydronephrosis. Bladder is unremarkable. Stomach/Bowel: Stomach, small bowel, and colon are not abnormally distended. Scattered stool throughout the colon. No wall thickening or inflammatory stranding. Appendix is not identified. Vascular/Lymphatic: Aortic atherosclerosis. No enlarged abdominal or pelvic lymph nodes. Reproductive: Uterus and bilateral adnexa are unremarkable. Large centrally necrotic uterine fibroid seen on previous study is no longer present, presumably resected in the interval. Other: No abdominal wall hernia or abnormality. No abdominopelvic ascites. Musculoskeletal: No acute or significant osseous findings. IMPRESSION: 1. No acute process demonstrated in the abdomen or pelvis. No evidence of bowel obstruction or inflammation. 2. 7 mm nonspecific hyperenhancing lesion in the dome of the liver. Elective MRI is recommended for further evaluation. 3. Moderate esophageal hiatal  hernia. 4. Aortic atherosclerosis. Electronically Signed   By: Elsie Gravely M.D.   On: 05/20/2024 20:16      Medications Ordered in the ED  iohexol (OMNIPAQUE) 300 MG/ML solution 100 mL (100 mLs Intravenous Contrast Given 05/20/24 1953)                                    Medical Decision Making Amount and/or Complexity of Data Reviewed Labs: ordered. Radiology: ordered.  Risk Prescription drug management.   Patient presents to the ED for concern of LUQ, LLQ abd pain, this involves an extensive number of treatment options, and is a complaint that carries with it a high risk of complications and morbidity.  The differential diagnosis includes pancreatitis, gallbladder pathology, gastritis, UTI, GERD   Co morbidities that complicate the patient evaluation  See HPI   Additional history obtained:  Additional history obtained from Nursing   External records from outside source obtained and reviewed including triage note   Lab Tests:  I Ordered, and personally interpreted labs.  The pertinent results include:   UA positive for trace ketones, trace protein, large leukocytes, WBC, few bacteria but is also contaminated  Total bili 1.4   Imaging Studies ordered:  I ordered imaging studies including CT abdomen pelvis with contrast I independently visualized and interpreted imaging which showed  No acute process demonstrated in the abdomen or pelvis. No evidence of bowel obstruction or inflammation. 7 mm nonspecific hyperenhancing lesion in the dome of the liver. Elective MRI is recommended for further evaluation. Moderate esophageal hiatal hernia. Aortic atherosclerosis. I agree with the radiologist interpretation     Problem List / ED Course:  Left sided abdominal pain Mild tenderness to LUQ, LLQ Otherwise is well-appearing.  She does not have physical signs of distress. VS WNL with no fever nor tachycardia  No history of pancreatitis, diverticulosis, nor abdominal  surgeries Lab work notable for leukocytes, WBC in UA however it is contaminated.  Shared decision making is had with patient regarding prophylactically treating for possible UTI versus sending urine culture and waiting for results prior to treatment.  She wishes to proceed with urine culture.  I think this is reasonable she does not have any urinary symptoms at this time.  Otherwise, lab work is unremarkable I did consider cardiac etiology asking to have atypical symptoms for ACS however she is tender in the abdomen which is reproducible.  She has no complaints of chest pain, shortness of breath.  She also reports that pain has improved since arrival to ED.  So I do have low suspicion for ACS as etiology Low suspicion of obstruction as she is passing gas and having regular bowel movements.  No signs of obstruction on CT Pain described as a pulling sensation and with normal workup, CT imaging without acute abnormalities, may be related to muscle strain, gastric mobility, Ozempic  use but do not see emergent cause of abd pain currently.  Discussed symptomatic treatment at home Passed PO challenge   Liver lesion Incidentally found Discussed that she can contact her PCP for nonemergent MRI for her liver lesion Recommendations for this was provided on DC paperwork   Reevaluation:  After the interventions noted above, I reevaluated the patient and found that they have :improved    Dispostion:  After consideration of the diagnostic results and the patients response to treatment, I feel that the patent would benefit from outpatient management with PCP follow-up for MRI.   Discussed ED workup, disposition, return to ED precautions with patient who expresses understanding agrees with plan.  All questions answered to their satisfaction.  They are agreeable to plan.  Discharge instructions provided on paperwork  Final diagnoses:  Left sided abdominal pain  Liver lesion    ED Discharge Orders      None        Linda Tinnie BRAVO, PA 05/22/24 0113    Mannie Pac T, DO 05/26/24 [phone]

## 2024-05-20 NOTE — Telephone Encounter (Signed)
 Patient arrived to the office today at 2:58 pm stating that she had a 3:00 appt today.  I let the patient know that she didn't have an appointment for today.  I looked in the Past in the patient's chart but didn't see any appointment that were made today.  She stated she made it on MyChart, however she said she never received a confirmation.  I called back to speak to Dr. Gib CMA to see if it was too late to book the 3:00 appt that was still open--at this point it was 3:05 pm.  Her CMA let me know that they were running about 45 minutes behind due to an elderly patient that had fell face first on concrete and had the previous appointment before the one she wanted to schedule.  Due to Dr. [place] running that far behind we could book the 3:00 appt at such short notice.  The patient proceeded to tell me she knows she made the appt and when I offered to help make her an appt with another provider or with Dr. [place] tomorrow, she stated she didn't wake up wanting an appt with a different provider and that she wanted the appt that she made with her own provider.    Candice, our office manager, came to the front to assist and the pt proceeded to tell her when she was getting details of what was happening that Candice was just saying she was stupid when Candice just simply asked her if she had received an appt confirmation when she made it on MyChart.  The pt proceeded to tell Candice no one was trying to help her, even though Candice heard the whole conversation between me and Dr. Gib CMA when I called her on the phone to try to help her.  When Candice mentioned that we were trying to help assist her she said How do I know who she just talked to?!  She could have just called anybody! Like I wasn't on the phone with Dr. Gib CMA.  Brittney, the [employer], let her know the same information that we had spoke with her about as far the appt confirmation she would have received from MyChart.  She tried  to explain to her that if one was made it would show up in the past tab in her chart.  [place] was trying to assist the patient but the patient stated she was done and she just wanted her records.   Pt then proceeded to ask for a medical release form to get her records because she said she knows she made the appt and is done with this office.  We explained to her that we were trying to assist her on being able to see someone as soon as possible, however she said she was done.  She filled out a medical release form with Ms. Sidney and left it with her.  Pt asked for the information for someone she could contact that was above [employer], the [employer].  She was provided with the number for Patient Experience before she left the office.  When provided with the number, she proceeded to tell Ms. Margarett What was she supposed to do with this fake number??  The patient left the building after that.

## 2024-05-20 NOTE — ED Triage Notes (Signed)
 Pt POV reporting LLQ abd pain that began last night. Hx giant cell arteritis, concerned for same. Seen at [facility] and advised to be seen for further evaluation. Last BM 2 days ago.

## 2024-05-21 ENCOUNTER — Other Ambulatory Visit: Payer: Self-pay | Admitting: Family Medicine

## 2024-05-21 ENCOUNTER — Encounter: Payer: Self-pay | Admitting: Family Medicine

## 2024-05-21 ENCOUNTER — Telehealth: Payer: Self-pay | Admitting: *Deleted

## 2024-05-21 DIAGNOSIS — K769 Liver disease, unspecified: Secondary | ICD-10-CM

## 2024-05-21 NOTE — Telephone Encounter (Signed)
 Liver MRI order placed. Thanks, BJ

## 2024-05-21 NOTE — Telephone Encounter (Signed)
 Copied from CRM #[id number]. Topic: Clinical - Request for Lab/Test Order >> May 21, 2024  8:30 AM Darshell M wrote: Reason for CRM: Patient had CT Scan in the ER. Spot found on liver. Patient advised to have PCP schedule an MRI of lower abdominal area. Sharlet [phone].

## 2024-05-22 ENCOUNTER — Ambulatory Visit
Admission: RE | Admit: 2024-05-22 | Discharge: 2024-05-22 | Disposition: A | Discharge status: Home / Self Care | Source: Ambulatory Visit | Attending: Family Medicine | Admitting: Family Medicine

## 2024-05-22 ENCOUNTER — Telehealth: Payer: Self-pay | Admitting: *Deleted

## 2024-05-22 DIAGNOSIS — Z1231 Encounter for screening mammogram for malignant neoplasm of breast: Secondary | ICD-10-CM | POA: Diagnosis not present

## 2024-05-22 DIAGNOSIS — Z0000 Encounter for general adult medical examination without abnormal findings: Secondary | ICD-10-CM

## 2024-05-22 LAB — URINE CULTURE: Culture: 10000 — AB

## 2024-05-22 NOTE — Telephone Encounter (Signed)
 Copied from CRM [id number]. Topic: General - Other >> May 22, 2024  9:03 AM Rosina BIRCH wrote: Reason for CRM: patient returning a call to Krissy [phone]

## 2024-05-22 NOTE — Telephone Encounter (Signed)
 MRI ordered has been placed. BJ

## 2024-05-22 NOTE — Telephone Encounter (Signed)
 Patient notified of Dr. Gib response. Patient voiced understanding.

## 2024-05-22 NOTE — Telephone Encounter (Signed)
 Left voicemail for patient to return my call.

## 2024-06-03 ENCOUNTER — Ambulatory Visit
Admission: RE | Admit: 2024-06-03 | Discharge: 2024-06-03 | Disposition: A | Discharge status: Home / Self Care | Source: Ambulatory Visit | Attending: Family Medicine | Admitting: Family Medicine

## 2024-06-03 DIAGNOSIS — K449 Diaphragmatic hernia without obstruction or gangrene: Secondary | ICD-10-CM | POA: Diagnosis not present

## 2024-06-03 DIAGNOSIS — K769 Liver disease, unspecified: Secondary | ICD-10-CM

## 2024-06-03 MED ORDER — GADOPICLENOL 0.5 MMOL/ML IV SOLN
10.0000 mL | Freq: Once | INTRAVENOUS | Status: AC | PRN
  Administered 2024-06-03: 10 mL via INTRAVENOUS

## 2024-06-04 ENCOUNTER — Encounter: Payer: Self-pay | Admitting: Family Medicine

## 2024-06-04 DIAGNOSIS — E785 Hyperlipidemia, unspecified: Secondary | ICD-10-CM

## 2024-06-05 MED ORDER — SIMVASTATIN 20 MG PO TABS: 20.0000 mg | ORAL_TABLET | Freq: Every day | ORAL | 1 refills | Status: AC

## 2024-06-08 ENCOUNTER — Ambulatory Visit: Payer: Self-pay | Admitting: Family Medicine

## 2024-06-10 DIAGNOSIS — H52203 Unspecified astigmatism, bilateral: Secondary | ICD-10-CM | POA: Diagnosis not present

## 2024-06-10 DIAGNOSIS — H4720 Unspecified optic atrophy: Secondary | ICD-10-CM | POA: Diagnosis not present

## 2024-06-23 ENCOUNTER — Other Ambulatory Visit: Payer: Self-pay | Admitting: Neurology

## 2024-06-23 DIAGNOSIS — G43109 Migraine with aura, not intractable, without status migrainosus: Secondary | ICD-10-CM

## 2024-06-26 ENCOUNTER — Encounter: Payer: Self-pay | Admitting: Neurology

## 2024-07-02 DIAGNOSIS — M2560 Stiffness of unspecified joint, not elsewhere classified: Secondary | ICD-10-CM | POA: Diagnosis not present

## 2024-07-02 DIAGNOSIS — Z79899 Other long term (current) drug therapy: Secondary | ICD-10-CM | POA: Diagnosis not present

## 2024-07-02 DIAGNOSIS — M316 Other giant cell arteritis: Secondary | ICD-10-CM | POA: Diagnosis not present

## 2024-07-02 DIAGNOSIS — M79641 Pain in right hand: Secondary | ICD-10-CM | POA: Diagnosis not present

## 2024-07-06 ENCOUNTER — Ambulatory Visit: Admitting: Family Medicine

## 2024-07-06 ENCOUNTER — Encounter: Payer: Self-pay | Admitting: Family Medicine

## 2024-07-06 VITALS — BP 132/70 | HR 71 | Temp 97.8°F | Resp 16 | Ht 60.0 in | Wt 205.4 lb

## 2024-07-06 DIAGNOSIS — J0100 Acute maxillary sinusitis, unspecified: Secondary | ICD-10-CM

## 2024-07-06 DIAGNOSIS — K2100 Gastro-esophageal reflux disease with esophagitis, without bleeding: Secondary | ICD-10-CM | POA: Diagnosis not present

## 2024-07-06 DIAGNOSIS — J309 Allergic rhinitis, unspecified: Secondary | ICD-10-CM | POA: Diagnosis not present

## 2024-07-06 MED ORDER — FLUTICASONE PROPIONATE 50 MCG/ACT NA SUSP: 1.0000 | Freq: Two times a day (BID) | NASAL | 0 refills | Status: AC

## 2024-07-06 MED ORDER — PANTOPRAZOLE SODIUM 40 MG PO TBEC: 40.0000 mg | DELAYED_RELEASE_TABLET | Freq: Every day | ORAL | 1 refills | Status: AC

## 2024-07-06 MED ORDER — AMOXICILLIN-POT CLAVULANATE 875-125 MG PO TABS: 1.0000 | ORAL_TABLET | Freq: Two times a day (BID) | ORAL | 0 refills | Status: AC

## 2024-07-06 NOTE — Progress Notes (Signed)
 ACUTE VISIT Chief Complaint  Patient presents with   Sinus Problem    Pt c/o aching on left face, L upper jaw, pressure above Left brow. Couple wks.   Gastroesophageal Reflux    Pt c/o acid reflux from hiatal hernia    HPI: [name] is a 63 y.o. female with PMHx significant for DM II, HLD, vertiginous migraines, temporal arteritis, seasonal allergies, and HTN who is here today complaining of a couple weeks of left maxillary sinus achy/pressure pain and left temporal headache. Pain is 5/10, intermittent,and problem has been stable. She has discussed problem with her rheumatologist, she was told it was not related with her history of temporal arteritis. Negative for fever, chills, sore throat. She has rhinorrhea, nasal congestion, and sneezing. No sick contacts or recent URI. She has not taken OTC medications.  Last dental exam 4 months ago, no problems found.  -She is also complaining of acid reflux and heartburn.  Problem has been going on for a while but it seems to be worse for the past few months. Nausea and episode of vomiting postprandially, last episode last night. Negative for abdominal pain, changes in bowel habits, or hemoptysis. In the past she took pantoprazole , which helped. Last EGD in 08/2020, which show hiatal hernia and esophagitis. She has not tried any OTC medications.  Review of Systems  Constitutional:  Negative for activity change, appetite change, chills, fever and unexpected weight change.  HENT:  Negative for sore throat and trouble swallowing.   Eyes:  Negative for discharge and redness.  Respiratory:  Negative for cough, shortness of breath and wheezing.   Cardiovascular:  Negative for chest pain.  Skin:  Negative for rash.  Allergic/Immunologic: Positive for environmental allergies.  Neurological:  Negative for syncope, facial asymmetry and weakness.  See other pertinent positives and negatives in HPI.  Current Outpatient Medications on  File Prior to Visit  Medication Sig Dispense Refill   ACTEMRA ACTPEN 162 MG/0.9ML SOAJ Inject into the skin.     amitriptyline  (ELAVIL ) 10 MG tablet TAKE 2 TABLETS BY MOUTH EVERY NIGHT 360 tablet 0   Continuous Glucose Receiver (FREESTYLE LIBRE 3 READER) DEVI Use to check blood sugars continuously. 1 each 0   Continuous Glucose Sensor (FREESTYLE LIBRE 3 PLUS SENSOR) MISC Change sensor every 15 days. 2 each 3   glucose blood test strip Onetouch Delica test strips; UAD to monitor glucose bid;Dx:E11.9 100 each 12   Insulin  Pen Needle (PEN NEEDLES) 32G X 4 MM MISC To use with insulin  100 each 2   Insulin  Syringe-Needle U-100 (GNP INSULIN  SYRINGES 31GX5/16) 31G X 5/16 0.3 ML MISC To use with insulin  100 each 3   OZEMPIC , 1 MG/DOSE, 4 MG/3ML SOPN INJECT 1 MG UNDER THE SKIN ONCE A WEEK AS DIRECTED. 9 mL 1   simvastatin  (ZOCOR ) 20 MG tablet Take 1 tablet (20 mg total) by mouth at bedtime. 90 tablet 1   Cal Carb-Mag Hydrox-Simeth (MYLANTA COAT & COOL) 1200-270-80 MG/10ML SUSP Take 15 mLs by mouth daily as needed (coat stomach). (Patient not taking: Reported on 07/06/2024)     No current facility-administered medications on file prior to visit.   Past Medical History:  Diagnosis Date   Allergic rhinitis    Allergy    SEASONAL- pt states she has not had issues in years   Anemia    Cardiac murmur    1/6 SEM at [facility] (old finding per patient)- no heart murmur per Dr. Jenetta   Carpal tunnel  syndrome 2023   Cubital tunnel syndrome 2023   Diabetes mellitus without complication (HCC)    GERD (gastroesophageal reflux disease)    History of hiatal hernia    Hyperlipidemia    Hypertension    Neuralgic migraines    silent migraines   Obesity    Vertigo    Allergies  Allergen Reactions   Morphine Itching    Hydrocodone is OK   Lisinopril  Swelling    Numbness and swelling of lips    Topamax  [Topiramate ] Rash    Social History   Socioeconomic History   Marital status: Married    Spouse  name: Linda [name]   Number of children: 0   Years of education: Masters   Highest education level: Not on file  Occupational History   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: [employer]  Tobacco Use   Smoking status: Never   Smokeless tobacco: Never  Vaping Use   Vaping status: Never Used  Substance and Sexual Activity   Alcohol use: Yes    Comment: maybe 1 a month   Drug use: No   Sexual activity: Yes    Birth control/protection: Post-menopausal  Other Topics Concern   Not on file  Social History Narrative   12/07/19   From: the area   Living: with husband, Hydrologist   Work: TEACHER, 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 [employer]   Education: [employer] grad, [employer] 2013      Family: Step son - Linda [name]       Enjoys: sing - band shows      Exercise: walking on the treadmill    Diet: not doing great with this, trying to cut back on carb intake      Safety   Seat belts: Yes    Guns: Yes  and secure   Safe in relationships: Yes     left handed    Social Drivers of Corporate Investment Banker Strain: Not on file  Food Insecurity: No Food Insecurity (12/31/2022)   Hunger Vital Sign    Worried About Running Out of Food in the Last Year: Never true    Ran Out of Food in the Last Year: Never true  Transportation Needs: No Transportation Needs (12/31/2022)   PRAPARE - Administrator, Civil Service (Medical): No    Lack of Transportation (Non-Medical): No  Physical Activity: Not on file  Stress: Not on file  Social Connections: Not on file   Vitals:   07/06/24 1043  BP: 132/70  Pulse: 71  Resp: 16  Temp: 97.8 F (36.6 C)  SpO2: 98%   Body mass index is 40.11 kg/m.  Physical Exam Vitals and nursing note reviewed.  Constitutional:      General: She is not in acute distress.    Appearance: She is well-developed. She is not ill-appearing.  HENT:     Head: Normocephalic and atraumatic.     Comments: No pain with palpation of left temporal area or TMJ.    Nose:  Septal deviation and rhinorrhea present.     Right Turbinates: Enlarged.     Right Sinus: No maxillary sinus tenderness or frontal sinus tenderness.     Left Sinus: Maxillary sinus tenderness (pressure) present. No frontal sinus tenderness.     Mouth/Throat:     Mouth: Mucous membranes are moist.     Pharynx: Oropharynx is clear.  Eyes:     Conjunctiva/sclera: Conjunctivae normal.  Cardiovascular:     Rate and Rhythm:  Normal rate and regular rhythm.     Heart sounds: No murmur heard. Pulmonary:     Effort: Pulmonary effort is normal. No respiratory distress.     Breath sounds: Normal breath sounds. No stridor.  Abdominal:     Palpations: Abdomen is soft. There is no mass.     Tenderness: There is no abdominal tenderness.  Lymphadenopathy:     Cervical: No cervical adenopathy.  Skin:    General: Skin is warm.     Findings: No erythema or rash.  Neurological:     Mental Status: She is alert and oriented to person, place, and time.  Psychiatric:        Mood and Affect: Mood and affect normal.    ASSESSMENT AND PLAN:  Ms. Dry was seen today for left maxillary and temporal pain.  Acute non-recurrent maxillary sinusitis We discussed differential diagnosis. Problem has been going on for a couple weeks, so I think it is appropriate at this time to try empiric antibiotic treatment.  Explained that if this is related with allergies or other noninfectious process, antibiotic will not help. Monitor for new symptoms. If persistent, recommend having a dental examination to evaluate for dental etiology. We may need to have a maxillofacial CT done if problem does not resolve.  -     Amoxicillin -Pot Clavulanate; Take 1 tablet by mouth 2 (two) times daily for 7 days.  Dispense: 14 tablet; Refill: 0  Gastroesophageal reflux disease with esophagitis without hemorrhage Problem is not well-controlled. Pantoprazole  helped in the past, so recommend resuming pantoprazole  40 mg daily 30  minutes before breakfast. GERD precautions also recommended. If problem does not resolve, she may need a follow-up with her gastroenterologist.  -     Pantoprazole  Sodium; Take 1 tablet (40 mg total) by mouth daily.  Dispense: 90 tablet; Refill: 1  Allergic rhinitis, unspecified seasonality, unspecified trigger Sinus symptoms she is reporting today could be related to this problem. Recommend Flonase  nasal spray at bedtime for 10 to 14 days then as needed. Nasal sinus irrigation as needed through the day.  -     Fluticasone  Propionate; Place 1 spray into both nostrils 2 (two) times daily.  Dispense: 16 g; Refill: 0   Return if symptoms worsen or fail to improve, for keep next appointment.  Verdia Bolt G. Mana Morison, MD  [facility]. [place] office.

## 2024-07-06 NOTE — Patient Instructions (Addendum)
 A few things to remember from today's visit:  Acute non-recurrent maxillary sinusitis - Plan: amoxicillin -clavulanate (AUGMENTIN ) 875-125 MG tablet  Gastroesophageal reflux disease with esophagitis without hemorrhage - Plan: pantoprazole  (PROTONIX ) 40 MG tablet Try Flonase  nasal spray at bedtime for 10 to 14 days and saline nasal irrigations during the day. If problem is persistent, we can consider sinus imaging.  Also Rx appointment with your dentist if pain is persisting. Start taking pantoprazole  30 minutes before breakfast or before lunch.  If you need refills for medications you take chronically, please call your pharmacy. Do not use My Chart to request refills or for acute issues that need immediate attention. If you send a my chart message, it may take a few days to be addressed, specially if I am not in the office.  Please be sure medication list is accurate. If a new problem present, please set up appointment sooner than planned today.

## 2024-10-12 ENCOUNTER — Ambulatory Visit: Payer: BC Managed Care – PPO | Admitting: Neurology
